# Patient Record
Sex: Male | Born: 1980 | Race: White | Hispanic: No | Marital: Single | Smoking: Current every day smoker
Health system: Southern US, Community
[De-identification: ages and names within clinical notes are randomized; demographics above are authoritative.]

## PROBLEM LIST (undated history)

## (undated) DIAGNOSIS — F4310 Post-traumatic stress disorder, unspecified: Secondary | ICD-10-CM

## (undated) DIAGNOSIS — F329 Major depressive disorder, single episode, unspecified: Secondary | ICD-10-CM

## (undated) DIAGNOSIS — F32A Depression, unspecified: Secondary | ICD-10-CM

## (undated) DIAGNOSIS — F319 Bipolar disorder, unspecified: Secondary | ICD-10-CM

## (undated) DIAGNOSIS — F1910 Other psychoactive substance abuse, uncomplicated: Secondary | ICD-10-CM

## (undated) HISTORY — PX: APPENDECTOMY: SHX54

---
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
## 2003-05-18 ENCOUNTER — Encounter: Payer: Self-pay | Admitting: Emergency Medicine

## 2003-05-19 ENCOUNTER — Encounter (INDEPENDENT_AMBULATORY_CARE_PROVIDER_SITE_OTHER): Payer: Self-pay | Admitting: *Deleted

## 2003-05-19 ENCOUNTER — Observation Stay (HOSPITAL_COMMUNITY): Admission: EM | Admit: 2003-05-19 | Discharge: 2003-05-20 | Payer: Self-pay | Admitting: Emergency Medicine

## 2008-03-10 ENCOUNTER — Emergency Department (HOSPITAL_COMMUNITY): Admission: EM | Admit: 2008-03-10 | Discharge: 2008-03-10 | Payer: Self-pay | Admitting: Emergency Medicine

## 2009-08-16 ENCOUNTER — Emergency Department (HOSPITAL_COMMUNITY): Admission: EM | Admit: 2009-08-16 | Discharge: 2009-08-16 | Payer: Self-pay | Admitting: Emergency Medicine

## 2011-03-16 NOTE — H&P (Signed)
   NAME:  Curtis Blankenship, Curtis Blankenship                          ACCOUNT NO.:  [id number]   MEDICAL RECORD NO.:  [id number]                   PATIENT TYPE:  INP   LOCATION:                                       FACILITY:  [facility]   PHYSICIAN:  Thornton Park. Daphine Deutscher, M.D.             DATE OF BIRTH:  [birth year]   DATE OF ADMISSION:  05/19/2003  DATE OF DISCHARGE:                                HISTORY & PHYSICAL   CHIEF COMPLAINT:  Appendicitis.   HISTORY:  This is a 30 year old white male who awoke on Monday morning, May 17, 2003, with abdominal pain. He thought this was going to go away and kind  of toughed it out for a while. He presented to the ED on July 20 and was  seen in evaluation and included lab showing a hemoglobin of 13.6 and a white  count of 10.9. A CT scan was obtained which showed a large phlegmon around  his appendix and had evidence of acute appendicitis.   PAST MEDICAL HISTORY:  The patient has no known drug allergies. He has had  no prior history.   SOCIAL HISTORY:  The patient is in the [employer], based in [place],  [place].   PHYSICAL EXAMINATION:  GENERAL:  Normally developed white male.  VITAL SIGNS:  Temperature 97.1, pulse 56, respirations 18, blood pressure  140/66.  HEENT:  Head normocephalic. Eyes nonicteric. Mouth with tongue ring.  NECK:  Supple.  CHEST:  Clear.  HEART:  Rhythm; no murmurs or gallops.  ABDOMEN:  Thin, muscular, and tender throughout lower quadrant at McBurney's  point.  EXTREMITIES:  Full range of motion.   IMPRESSION:  Acute appendicitis.   PLAN:  Laparoscopic appendectomy. I have discussed this with him and his  mother and will move forward as soon as possible.                                               Thornton Park Daphine Deutscher, M.D.    MBM/MEDQ  D:  05/19/2003  T:  05/19/2003  Job:  [id number]

## 2011-03-16 NOTE — Op Note (Signed)
NAME:  Curtis Blankenship, Curtis Blankenship                          ACCOUNT NO.:  [id number]   MEDICAL RECORD NO.:  [id number]                   PATIENT TYPE:  INP   LOCATION:  1827                                 FACILITY:  [facility]   PHYSICIAN:  Thornton Park. Daphine Deutscher, M.D.             DATE OF BIRTH:  01/18/1981   DATE OF PROCEDURE:  05/19/2003  DATE OF DISCHARGE:                                 OPERATIVE REPORT   PREOPERATIVE DIAGNOSIS:  Acute appendicitis.   POSTOPERATIVE DIAGNOSIS:  Acute ruptured appendix with abscess.   OPERATION PERFORMED:  Laparoscopic drainage of abscess and appendectomy.   SURGEON:  Thornton Park. Daphine Deutscher, M.D.   ANESTHESIA:  General endotracheal.   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  CT scan suggested appendicitis.   DESCRIPTION OF PROCEDURE:  He was taken to operating room 16 and given  general anesthesia.  Preoperatively, he received 3g of Unasyn.  A  longitudinal incision was made down in his umbilicus first and entered with  a Hasson technique without difficulty.  The abdomen was insufflated.  A 5 mm  placed in the right upper quadrant and a 10-11 in the left lower quadrant.  The cecum was somewhat mobile and the appendix was seen coming off the cecal  tip and heading over and tucked into a ball adjacent to the cecum.  I took  down some of the peritoneum and omentum that was stuck onto this ball and  then dissected beneath the appendix proximally and divided the appendix off  the cecum.  I then began lifting the appendix up and dissecting the  mesentery.  It was here that I found the appendiceal abscess which I entered  and controlled the drainage with a suction catheter.  This had creamy yellow  pus which was evacuated.  I then was able to divide the mesentery using the  Harmonic scalpel and eventually completely extricate  the appendix and place  it in a bag and bring it out.  I went back in and irrigated.  The staple  line of the cecum was hemostatic.  No bleeding was seen.  The appendix had  been placed in the bag and brought out through the umbilicus.  I then  irrigated and observed the area and was satisfied with the evacuation of  fluid and that there was no evidence of bleeding.  The umbilical port was  repaired under direct vision using a 30 degree scope in place and then the  abdomen was deflated and the ports were withdrawn. The wounds were injected  with some Marcaine and were closed with 4-0 Vicryl.  The umbilical port had  previously been placed, closed under direct vision with 2-0 Vicryls in the  umbilicus.  After skin closure, benzoin and Steri-Strips were used  on the  skin.  The patient seemed to tolerate the procedure well and was taken to  the recovery room in satisfactory condition.   FINAL DIAGNOSIS:  Ruptured appendix with abscess, status post takedown,  drainage and appendectomy, laparoscopically.                                               Thornton Park Daphine Deutscher, M.D.   MBM/MEDQ  D:  05/19/2003  T:  05/19/2003  Job:  [id number]

## 2011-03-16 NOTE — Discharge Summary (Signed)
   NAME:  RICHARDSON, DUBREE                          ACCOUNT NO.:  [id number]   MEDICAL RECORD NO.:  [id number]                   PATIENT TYPE:  INP   LOCATION:  5739                                 FACILITY:  [facility]   PHYSICIAN:  Thornton Park. Daphine Deutscher, M.D.             DATE OF BIRTH:  [birth year]   DATE OF ADMISSION:  05/18/2003  DATE OF DISCHARGE:  05/20/2003                                 DISCHARGE SUMMARY   ADMISSION DIAGNOSIS:  Acute appendicitis.   DISCHARGE DIAGNOSIS:  Acute appendicitis with rupture and abscess formation.   PROCEDURE:  Laparoscopic appendectomy with laparoscopic drainage of abscess  and appendectomy.   HOSPITAL COURSE:  Tashan Kreitzer was worked up on May 18, 2003, in the ED  and was subsequently found to have appendicitis around midnight on the  beginning of May 19, 2003, at which time he underwent the above-mentioned  laparoscopic appendectomy.  Postoperatively he did well but was having pain  and some urinary retention, which he overcame.  On the morning of May 20, 2003, his CBC showed a white count of 5000 with a normal hemoglobin.  He was  taking liquids fine.  He was felt to be able to go home, where he is on  leave prior to returning to the [employer] at [place]. [place], [place].  He is  going out on a dosage of Augmentin 875 mg twice a day for seven days and  Tylox for pain.  I have told his mother that I do not want him to go back to  [place]. [place], [place], this coming Monday as I do not think he is ready.  Since he had a ruptured appendix, we need to keep him so that we can follow  up with him in about a week to make sure he is on the way to recovery, at  which time we can negotiate a time for him to return to the [employer].  Incisions are fine, and his condition is improved.   PLAN:  To return to [facility] office in one week.   DISCHARGE MEDICATIONS:  Augmentin and Tylox.   FINAL DIAGNOSIS:  Acute appendicitis with rupture.                         Thornton Park Daphine Deutscher, M.D.    MBM/MEDQ  D:  05/20/2003  T:  05/20/2003  Job:  [id number]

## 2011-04-26 ENCOUNTER — Emergency Department (HOSPITAL_COMMUNITY)
Admission: EM | Admit: 2011-04-26 | Discharge: 2011-04-28 | Disposition: A | Discharge status: Other Acute Inpatient Hospital | Payer: Self-pay | Attending: Emergency Medicine | Admitting: Emergency Medicine

## 2011-04-26 DIAGNOSIS — T50904A Poisoning by unspecified drugs, medicaments and biological substances, undetermined, initial encounter: Secondary | ICD-10-CM | POA: Insufficient documentation

## 2011-04-26 DIAGNOSIS — F3289 Other specified depressive episodes: Secondary | ICD-10-CM | POA: Insufficient documentation

## 2011-04-26 DIAGNOSIS — F329 Major depressive disorder, single episode, unspecified: Secondary | ICD-10-CM | POA: Insufficient documentation

## 2011-04-26 DIAGNOSIS — T50901A Poisoning by unspecified drugs, medicaments and biological substances, accidental (unintentional), initial encounter: Secondary | ICD-10-CM | POA: Insufficient documentation

## 2011-04-27 LAB — CBC
HCT: 37 % — ABNORMAL LOW (ref 39.0–52.0)
Hemoglobin: 12.8 g/dL — ABNORMAL LOW (ref 13.0–17.0)
MCH: 30.9 pg (ref 26.0–34.0)
MCHC: 34.6 g/dL (ref 30.0–36.0)
MCV: 89.4 fL (ref 78.0–100.0)
Platelets: 370 10*3/uL (ref 150–400)
RBC: 4.14 MIL/uL — ABNORMAL LOW (ref 4.22–5.81)
RDW: 12.8 % (ref 11.5–15.5)
WBC: 9.3 10*3/uL (ref 4.0–10.5)

## 2011-04-27 LAB — DIFFERENTIAL
Basophils Absolute: 0.1 10*3/uL (ref 0.0–0.1)
Basophils Relative: 1 % (ref 0–1)
Eosinophils Absolute: 0.1 10*3/uL (ref 0.0–0.7)
Eosinophils Relative: 1 % (ref 0–5)
Lymphocytes Relative: 38 % (ref 12–46)
Lymphs Abs: 3.6 10*3/uL (ref 0.7–4.0)
Monocytes Absolute: 0.4 10*3/uL (ref 0.1–1.0)
Monocytes Relative: 4 % (ref 3–12)
Neutro Abs: 5.2 10*3/uL (ref 1.7–7.7)
Neutrophils Relative %: 56 % (ref 43–77)

## 2011-04-27 LAB — URINALYSIS, ROUTINE W REFLEX MICROSCOPIC
Bilirubin Urine: NEGATIVE
Glucose, UA: NEGATIVE mg/dL
Hgb urine dipstick: NEGATIVE
Protein, ur: NEGATIVE mg/dL

## 2011-04-27 LAB — COMPREHENSIVE METABOLIC PANEL
ALT: 19 U/L (ref 0–53)
AST: 17 U/L (ref 0–37)
Alkaline Phosphatase: 75 U/L (ref 39–117)
CO2: 25 mEq/L (ref 19–32)
Calcium: 9.1 mg/dL (ref 8.4–10.5)
Chloride: 101 mEq/L (ref 96–112)
GFR calc non Af Amer: >60 mL/min (ref >60)
Potassium: 3.7 mEq/L (ref 3.5–5.1)
Sodium: 138 mEq/L (ref 135–145)

## 2011-04-27 LAB — RAPID URINE DRUG SCREEN, HOSP PERFORMED
Amphetamines: NOT DETECTED
Cocaine: NOT DETECTED
Opiates: NOT DETECTED
Tetrahydrocannabinol: NOT DETECTED

## 2011-04-27 LAB — GLUCOSE, CAPILLARY: Glucose-Capillary: 123 mg/dL — ABNORMAL HIGH (ref 70–99)

## 2011-04-27 LAB — SALICYLATE LEVEL: Salicylate Lvl: <2 mg/dL — ABNORMAL LOW (ref 2.8–20.0)

## 2011-04-28 DIAGNOSIS — F322 Major depressive disorder, single episode, severe without psychotic features: Secondary | ICD-10-CM

## 2011-04-28 DIAGNOSIS — F4310 Post-traumatic stress disorder, unspecified: Secondary | ICD-10-CM

## 2011-04-28 NOTE — Consult Note (Signed)
Curtis Blankenship, Curtis Blankenship             ACCOUNT NO.:  [id number]  MEDICAL RECORD NO.:  [id number]  LOCATION:  [facility]                         FACILITY:  [facility]  PHYSICIAN:  Franchot Gallo, MD     DATE OF BIRTH:  September 27, 1981  DATE OF CONSULTATION:  04/28/2011 DATE OF DISCHARGE:  04/26/2011                                CONSULTATION   CHIEF COMPLAINT:  "I tried to kill myself with propranolol."  HISTORY OF PRESENT ILLNESS:  Mr. Curtis Blankenship is a 30 year old single white male who presented to [facility] after overdosing on 20 propranolol tablets in an apparent suicide attempt.  The patient stated on interview that he "just wanted everything to go way."  When asked in more detail, the patient states that he has been depressed for some time, rating his depression today on a scale of 1 to 10 as an 8.  He reports trouble initiating and maintaining sleep as well as decreased appetite and reports severe feelings of sadness, anhedonia and depressed mood.  He denies any current suicidal ideations as well as any homicidal ideations.  The patient also states he has significant anxiety throughout the day and currently rates his anxiety on a scale of 1 to 10 as a 9.  He denies any past or current auditory or visual hallucinations or delusional thinking and also denies any past or current manic or hypomanic symptoms.  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  He states that he has frequent flashbacks at night related to traumatic events.  He has been followed at the [facility] for some time for his symptoms.  The patient also reports a past history of alcohol abuse but states that he has been sober for approximately 5 months.  The patient presents for evaluation and treatment of the above symptoms.  PAST PSYCHIATRIC HISTORY:  The patient reports 2 past psychiatric hospitalizations both to [facility] hospitals.  He states that he  was hospitalized at the [facility] in [place] on their subspecialty unit in January 2012 and was secondarily hospitalized at a [facility] in [place] for PTSD between April and June 2012.  CURRENT MEDICATIONS: 1. Prazosin 4 mg p.o. q.h.s. 2. Risperdal 3 mg b.i.d. 3. Effexor XR 225 mg p.o. q.a.m. 4. Ambien 10 mg p.o. q.h.s./p.r.n. for sleep.  ALLERGIES:  NKDA.  MEDICAL ILLNESSES:  None reported.  PAST OPERATIONS:  Appendectomy approximately 8 years ago.  FAMILY HISTORY:  The patient states that his mother is depressed and his maternal grandparents have a history of alcohol abuse.  He denies any other family history of psychiatric or substance related illnesses.  SOCIAL HISTORY:  The patient states that he was born and raised in [place], [place] area and currently lives in [place] with his parents.  He is single and has never had children.  He states that he completed high school and some college and is currently unemployed. He reports smoking 1 pack of cigarettes per day and as stated above has a past abuse of alcohol but states that he has been sober for the past 5 months.  He denies any use of illicit drugs.  MENTAL STATUS EXAMINATION:  General - the patient was alert and oriented x3 and was friendly and cooperative with this Environmental consultant.  Speech was appropriate in terms of rate and volume.  Mood appeared severely depressed.  Affect was essentially flat.  Thoughts - the patient denied any obvious delusions or hallucinations, not reporting any suicidal or homicidal ideations.  Judgment and insight today both appeared fair to good.  IMPRESSION:   Axis I: 1. Major depressive disorder - recurrent - severe. 2. Generalized anxiety disorder - currently under poor control. 3. Posttraumatic stress disorder - currently poor control. 4. Alcohol dependence - in remission x5 months. 5. Nicotine dependence. Axis II:  Deferred. Axis III:  None reported. Axis IV:   Unemployment, financial constraints, serious chronic mental illness. Axis V:  Global Assessment of Functioning at time of admission approximately 40.  Highest Global Assessment of Functioning past year approximately 60.  PLAN: 1. The patient will continue on his current medications as prescribed. 2. The patient will be continued to be monitored for dangers to self     and/or others. 3. The patient has been accepted for treatment at the [facility] in     [place], [place], and is awaiting transport.    _________________________________ Franchot Gallo, MD     RR/MEDQ  D:  04/28/2011  T:  04/28/2011  Job:  [id number]  Electronically Signed by Franchot Gallo MD on 04/28/2011 04:28:03 PM

## 2012-09-04 ENCOUNTER — Emergency Department (HOSPITAL_COMMUNITY): Payer: Non-veteran care

## 2012-09-04 ENCOUNTER — Emergency Department (HOSPITAL_COMMUNITY)
Admission: EM | Admit: 2012-09-04 | Discharge: 2012-09-05 | Disposition: A | Discharge status: Home / Self Care | Payer: Non-veteran care | Attending: Emergency Medicine | Admitting: Emergency Medicine

## 2012-09-04 ENCOUNTER — Encounter (HOSPITAL_COMMUNITY): Payer: Self-pay | Admitting: Emergency Medicine

## 2012-09-04 DIAGNOSIS — F4310 Post-traumatic stress disorder, unspecified: Secondary | ICD-10-CM | POA: Insufficient documentation

## 2012-09-04 DIAGNOSIS — F17200 Nicotine dependence, unspecified, uncomplicated: Secondary | ICD-10-CM | POA: Insufficient documentation

## 2012-09-04 DIAGNOSIS — L539 Erythematous condition, unspecified: Secondary | ICD-10-CM | POA: Insufficient documentation

## 2012-09-04 DIAGNOSIS — L02414 Cutaneous abscess of left upper limb: Secondary | ICD-10-CM

## 2012-09-04 DIAGNOSIS — F1410 Cocaine abuse, uncomplicated: Secondary | ICD-10-CM | POA: Insufficient documentation

## 2012-09-04 DIAGNOSIS — R0602 Shortness of breath: Secondary | ICD-10-CM | POA: Insufficient documentation

## 2012-09-04 DIAGNOSIS — Z79899 Other long term (current) drug therapy: Secondary | ICD-10-CM | POA: Insufficient documentation

## 2012-09-04 DIAGNOSIS — F319 Bipolar disorder, unspecified: Secondary | ICD-10-CM | POA: Insufficient documentation

## 2012-09-04 DIAGNOSIS — R079 Chest pain, unspecified: Secondary | ICD-10-CM | POA: Insufficient documentation

## 2012-09-04 DIAGNOSIS — IMO0002 Reserved for concepts with insufficient information to code with codable children: Secondary | ICD-10-CM | POA: Insufficient documentation

## 2012-09-04 HISTORY — DX: Bipolar disorder, unspecified: F31.9

## 2012-09-04 HISTORY — DX: Post-traumatic stress disorder, unspecified: F43.10

## 2012-09-04 LAB — COMPREHENSIVE METABOLIC PANEL
ALT: 22 U/L (ref 0–53)
Albumin: 3.9 g/dL (ref 3.5–5.2)
Alkaline Phosphatase: 112 U/L (ref 39–117)
Potassium: 3.5 mEq/L (ref 3.5–5.1)
Sodium: 137 mEq/L (ref 135–145)
Total Protein: 7.3 g/dL (ref 6.0–8.3)

## 2012-09-04 LAB — CBC WITH DIFFERENTIAL/PLATELET
Basophils Relative: 1 % (ref 0–1)
Eosinophils Absolute: 0.1 10*3/uL (ref 0.0–0.7)
MCH: 31.1 pg (ref 26.0–34.0)
MCHC: 34.3 g/dL (ref 30.0–36.0)
Neutrophils Relative %: 61 % (ref 43–77)
Platelets: 344 10*3/uL (ref 150–400)
RBC: 4.21 MIL/uL — ABNORMAL LOW (ref 4.22–5.81)

## 2012-09-04 LAB — TROPONIN I: Troponin I: <0.3 ng/mL (ref <0.30)

## 2012-09-04 LAB — RAPID URINE DRUG SCREEN, HOSP PERFORMED
Amphetamines: NOT DETECTED
Barbiturates: NOT DETECTED
Benzodiazepines: NOT DETECTED

## 2012-09-04 MED ORDER — VENLAFAXINE HCL ER 150 MG PO CP24
225.0000 mg | ORAL_CAPSULE | Freq: Every day | ORAL | Status: DC
  Administered 2012-09-04: 225 mg via ORAL
  Filled 2012-09-04 (×2): qty 1

## 2012-09-04 MED ORDER — ZOLPIDEM TARTRATE 5 MG PO TABS: 10.0000 mg | ORAL_TABLET | Freq: Every evening | ORAL | Status: DC | PRN

## 2012-09-04 MED ORDER — LORAZEPAM 1 MG PO TABS
1.0000 mg | ORAL_TABLET | Freq: Three times a day (TID) | ORAL | Status: DC | PRN
  Administered 2012-09-04: 1 mg via ORAL
  Filled 2012-09-04: qty 1

## 2012-09-04 MED ORDER — LIDOCAINE HCL 1 % IJ SOLN
INTRAMUSCULAR | Status: AC
  Administered 2012-09-04: 3 mL
  Filled 2012-09-04: qty 20

## 2012-09-04 MED ORDER — PRAZOSIN HCL 2 MG PO CAPS
2.0000 mg | ORAL_CAPSULE | Freq: Two times a day (BID) | ORAL | Status: DC
  Administered 2012-09-04: 2 mg via ORAL
  Filled 2012-09-04 (×3): qty 1

## 2012-09-04 MED ORDER — NICOTINE 21 MG/24HR TD PT24
21.0000 mg | MEDICATED_PATCH | Freq: Every day | TRANSDERMAL | Status: DC
  Administered 2012-09-04: 21 mg via TRANSDERMAL
  Filled 2012-09-04: qty 1

## 2012-09-04 MED ORDER — ONDANSETRON HCL 4 MG PO TABS: 4.0000 mg | ORAL_TABLET | Freq: Three times a day (TID) | ORAL | Status: DC | PRN

## 2012-09-04 MED ORDER — IBUPROFEN 200 MG PO TABS: 600.0000 mg | ORAL_TABLET | Freq: Three times a day (TID) | ORAL | Status: DC | PRN

## 2012-09-04 MED ORDER — CLINDAMYCIN PHOSPHATE 600 MG/50ML IV SOLN
600.0000 mg | Freq: Once | INTRAVENOUS | Status: AC
  Administered 2012-09-04: 600 mg via INTRAVENOUS
  Filled 2012-09-04: qty 50

## 2012-09-04 MED ORDER — CLINDAMYCIN HCL 300 MG PO CAPS
300.0000 mg | ORAL_CAPSULE | Freq: Three times a day (TID) | ORAL | Status: DC
  Administered 2012-09-04 (×2): 300 mg via ORAL
  Filled 2012-09-04 (×2): qty 1

## 2012-09-04 MED ORDER — ACETAMINOPHEN 325 MG PO TABS: 650.0000 mg | ORAL_TABLET | ORAL | Status: DC | PRN

## 2012-09-04 MED ORDER — SODIUM CHLORIDE 0.9 % IV BOLUS (SEPSIS)
500.0000 mL | Freq: Once | INTRAVENOUS | Status: AC
  Administered 2012-09-04: 500 mL via INTRAVENOUS

## 2012-09-04 NOTE — ED Notes (Signed)
Pt belongings in locker #[id number] 

## 2012-09-04 NOTE — ED Provider Notes (Addendum)
INCISION AND DRAINAGE Performed by: Jaci Carrel Consent: Verbal consent obtained. Risks and benefits: risks, benefits and alternatives were discussed Type: abscess  Body area: left anterior forearm, just inferior to [facility] area  Anesthesia: local infiltration  Local anesthetic: lidocaine 2% no epinephrine  Anesthetic total: 1 ml  Complexity: complex Blunt dissection to break up loculations  Drainage: purulent  Drainage amount: moderate  Packing material: none  Patient tolerance: Patient tolerated the procedure well with no immediate complications.  scalpel used to perform incision  Jaci Carrel, PA-C 09/04/12 [address] PA-C 09/23/12 1533

## 2012-09-04 NOTE — ED Notes (Signed)
Per EMS: Pt used 8-ball injection of cocaine this morning, experienced chest pain, which has resolved and requesting detox. Left arm appears to have infection at injection sites.

## 2012-09-04 NOTE — ED Notes (Signed)
Pt present with need to detox with cocaine.  Pt aaox3.  Pt has no complaints at this time.

## 2012-09-04 NOTE — ED Notes (Signed)
[id number]<AV> Expected date:<BR> Expected time:<BR> Means of arrival:<BR> Comments:<BR> Numbness after shooting up cocaine

## 2012-09-04 NOTE — ED Notes (Signed)
Pt unable to urinate. Dr. [name] made aware.

## 2012-09-04 NOTE — ED Provider Notes (Signed)
History     CSN: [id number]  Arrival date & time 09/04/12  1214   First MD Initiated Contact with Patient 09/04/12 1323      Chief Complaint  Patient presents with  . Chest Pain    After using cocaine and requesting detox  . Skin Problem    Skin infection, abcess noted to left [facility]    (Consider location/radiation/quality/duration/timing/severity/associated sxs/prior treatment) Patient is a 31 y.o. male presenting with chest pain. The history is provided by the patient.  Chest Pain Primary symptoms include shortness of breath. Pertinent negatives for primary symptoms include no abdominal pain, no nausea and no vomiting.  Pertinent negatives for associated symptoms include no numbness and no weakness.    patient presents with sharp chest pain associated with dyspnea after injecting cocaine. He feels much better now. He states he does not usually have chest pain with his cocaine use. He states he's been using heavily recently. He states that he used an 8 ball this morning. He denies other drug use. He states that he has an abscess on his left arm. He states that he missed the vein a couple days ago and has gotten bigger and more painful. No fevers. No previous heart history.  Past Medical History  Diagnosis Date  . Bipolar disorder   . PTSD (post-traumatic stress disorder)     Past Surgical History  Procedure Date  . Appendectomy     No family history on file.  History  Substance Use Topics  . Smoking status: Current Every Day Smoker -- 0.2 packs/day for 10 years    Types: Cigarettes  . Smokeless tobacco: Former Neurosurgeon    Types: Snuff  . Alcohol Use: 24.0 oz/week    40 Cans of beer per week      Review of Systems  Constitutional: Negative for activity change and appetite change.  HENT: Negative for neck stiffness.   Eyes: Negative for pain.  Respiratory: Positive for shortness of breath. Negative for chest tightness.   Cardiovascular: Positive for chest pain. Negative  for leg swelling.  Gastrointestinal: Negative for nausea, vomiting, abdominal pain and diarrhea.  Genitourinary: Negative for flank pain.  Musculoskeletal: Negative for back pain.  Skin: Positive for wound. Negative for rash.  Neurological: Negative for weakness, numbness and headaches.  Psychiatric/Behavioral: Positive for dysphoric mood. Negative for suicidal ideas and behavioral problems.    Allergies  Review of patient's allergies indicates no known allergies.  Home Medications   Current Outpatient Rx  Name  Route  Sig  Dispense  Refill  . PRAZOSIN HCL 2 MG PO CAPS   Oral   Take 2 mg by mouth 2 (two) times daily.         . VENLAFAXINE HCL ER 75 MG PO CP24   Oral   Take 225 mg by mouth daily.         Marland Kitchen ZOLPIDEM TARTRATE 10 MG PO TABS   Oral   Take 10 mg by mouth at bedtime as needed. For sleep.           BP 155/81  Pulse 79  Temp 98.6 F (37 C) (Oral)  Resp 18  SpO2 97%  Physical Exam  Nursing note and vitals reviewed. Constitutional: He is oriented to person, place, and time. He appears well-developed and well-nourished.  HENT:  Head: Normocephalic and atraumatic.  Eyes: EOM are normal. Pupils are equal, round, and reactive to light.  Neck: Normal range of motion. Neck supple.  Cardiovascular: Normal rate,  regular rhythm and normal heart sounds.   No murmur heard. Pulmonary/Chest: Effort normal and breath sounds normal.  Abdominal: Soft. Bowel sounds are normal. He exhibits no distension and no mass. There is no tenderness. There is no rebound and no guarding.  Musculoskeletal: Normal range of motion. He exhibits no edema.  Neurological: He is alert and oriented to person, place, and time. No cranial nerve deficit.  Skin: Skin is warm and dry.       Patient has multiple injection sites on bilateral forearms. He has an approximately 3 cm firm erythematous mass on his left lateral antecubital area.  Psychiatric: He has a normal mood and affect.    ED  Course  Procedures (including critical care time)  Labs Reviewed  CBC WITH DIFFERENTIAL - Abnormal; Notable for the following:    RBC 4.21 (*)     HCT 38.2 (*)     All other components within normal limits  COMPREHENSIVE METABOLIC PANEL  ETHANOL  TROPONIN I  URINE RAPID DRUG SCREEN (HOSP PERFORMED)   Dg Chest 2 View  09/04/2012  *RADIOLOGY REPORT*  Clinical Data: Chest pain.  Smoker.  CHEST - 2 VIEW  Comparison: None.  Findings: Normal sized heart.  Clear lungs.  Mild central peribronchial thickening.  Normal appearing bones.  IMPRESSION: Mild bronchitic changes.   Original Report Authenticated By: Beckie Salts, M.D.      1. Cocaine abuse   2. Chest pain   3. Abscess of arm, left      Date: 09/04/2012  Rate: 97  Rhythm: normal sinus rhythm  QRS Axis: normal  Intervals: normal  ST/T Wave abnormalities: normal  Conduction Disutrbances:none  Narrative Interpretation:   Old EKG Reviewed: none available    MDM  Patient with left forearm abscess. It will be drained and antibiotics will be started. Is also having chest pain after his cocaine use. He appears to medically cleared at this time with negative EKG negative enzymes. He is requesting detox off of cocaine. Some lab work is pending but I suspect he'll be medically cleared. He'll be seen by the ACT team.        Juliet Rude. [name], MD 09/04/12 [id number]

## 2012-09-04 NOTE — Progress Notes (Signed)
pt states [facility] & he also goes to [facility] for services

## 2012-09-05 MED ORDER — ASPIRIN 81 MG PO CHEW: 81.0000 mg | CHEWABLE_TABLET | Freq: Every day | ORAL | Status: DC

## 2012-09-05 NOTE — ED Provider Notes (Signed)
Medical screening examination/treatment/procedure(s) were conducted as a shared visit with non-physician practitioner(s) and myself.  I personally evaluated the patient during the encounter  Curtis Blankenship. [name], MD 09/05/12 [phone]

## 2012-09-05 NOTE — BH Assessment (Signed)
Assessment Note   Curtis Blankenship is a 31 y.o. male who presents to [facility] for detox.  Pt reports drinking 1 case of beer daily, last drink was 09/04/12, pt says had 4-40's in the morning.  Pt has no alcohol present in his BAL.  Pt says he binges on cocaine, last use was 09/04/12, pt says used $500 for 4 days.  This Clinical research associate told pt he could benefit from rehab.  Pt was agreeable and given referrals.  Dr. Dierdre Highman agreed with disposition and pt d/c home.   Axis I: Substance Abuse Axis II: Deferred Axis III:  Past Medical History  Diagnosis Date  . Bipolar disorder   . PTSD (post-traumatic stress disorder)    Axis IV: other psychosocial or environmental problems, problems related to social environment and problems with primary support group Axis V: 51-60 moderate symptoms  Past Medical History:  Past Medical History  Diagnosis Date  . Bipolar disorder   . PTSD (post-traumatic stress disorder)     Past Surgical History  Procedure Date  . Appendectomy     Family History: No family history on file.  Social History:  reports that he has been smoking Cigarettes.  He has a 2.5 pack-year smoking history. He has quit using smokeless tobacco. His smokeless tobacco use included Snuff. He reports that he drinks about 24 ounces of alcohol per week. He reports that he uses illicit drugs (Cocaine) about 3 times per week.  Additional Social History:  Alcohol / Drug Use Pain Medications: None  Prescriptions: None  Over the Counter: Noe  History of alcohol / drug use?: Yes Longest period of sobriety (when/how long): Unk  Negative Consequences of Use: Personal relationships Withdrawal Symptoms: Other (Comment) (No w/d sxs present) Substance #1 Name of Substance 1: Cocaine  1 - Age of First Use: 20 YOM  1 - Amount (size/oz): $500 1 - Frequency: "Binge"  1 - Duration: 4 days  1 - Last Use / Amount: 09/04/12 Substance #2 Name of Substance 2: Alcohol  2 - Age of First Use: 15 YOM  2 - Amount  (size/oz): 1 Case  2 - Frequency: Daily  2 - Duration: On-going  2 - Last Use / Amount: 09/04/12  CIWA: CIWA-Ar BP: 118/76 mmHg Pulse Rate: 109  Nausea and Vomiting: no nausea and no vomiting Tactile Disturbances: none Tremor: no tremor Auditory Disturbances: not present Paroxysmal Sweats: no sweat visible Visual Disturbances: not present Anxiety: no anxiety, at ease Headache, Fullness in Head: none present Agitation: normal activity Orientation and Clouding of Sensorium: oriented and can do serial additions CIWA-Ar Total: 0  COWS:    Allergies: No Known Allergies  Home Medications:  (Not in a hospital admission)  OB/GYN Status:  No LMP for male patient.  General Assessment Data [facility] of [facility]: [facility] Living Arrangements: Alone Can pt return to current living arrangement?: Yes Admission Status: Voluntary Is patient capable of signing voluntary admission?: Yes Transfer from: Acute Hospital Referral Source: MD  Education Status Is patient currently in school?: No Current Grade: None  Highest grade of school patient has completed: None  Name of school: None  Contact person: None   Risk to self Suicidal Ideation: No Suicidal Intent: No Is patient at risk for suicide?: No Suicidal Plan?: No Access to Means: No What has been your use of drugs/alcohol within the last 12 months?: Abusing; alcohol and cocaine  Previous Attempts/Gestures: No How many times?: 0  Other Self Harm Risks: None  Triggers for Past Attempts:  None known Intentional Self Injurious Behavior: None Family Suicide History: No Recent stressful life event(s): Other (Comment) (SA) Persecutory voices/beliefs?: No Depression: Yes Depression Symptoms: Loss of interest in usual pleasures Substance abuse history and/or treatment for substance abuse?: Yes Suicide prevention information given to non-admitted patients: Not applicable  Risk to Others Homicidal Ideation: No Thoughts of Harm to  Others: No Current Homicidal Intent: No Current Homicidal Plan: No Access to Homicidal Means: No Identified Victim: None  History of harm to others?: No Assessment of Violence: None Noted Violent Behavior Description: None  Does patient have access to weapons?: No Criminal Charges Pending?: No Does patient have a court date: No  Psychosis Hallucinations: None noted Delusions: None noted  Mental Status Report Appear/Hygiene: Disheveled Eye Contact: Good Motor Activity: Unremarkable Speech: Logical/coherent Level of Consciousness: Alert Mood: Sad Affect: Sad Anxiety Level: None Thought Processes: Coherent;Relevant Judgement: Unimpaired Orientation: Person;Place;Time;Situation Obsessive Compulsive Thoughts/Behaviors: None  Cognitive Functioning Concentration: Normal Memory: Recent Intact;Remote Intact IQ: Average Insight: Fair Impulse Control: Fair Appetite: Fair Weight Loss: 0  Weight Gain: 0  Sleep: No Change Total Hours of Sleep: 8  Vegetative Symptoms: None  ADLScreening [facility] Assessment [facility]) Patient's cognitive ability adequate to safely complete daily activities?: Yes Patient able to express need for assistance with ADLs?: Yes Independently performs ADLs?: Yes (appropriate for developmental age)  Abuse/Neglect [facility]) Physical Abuse: Denies Verbal Abuse: Denies Sexual Abuse: Denies  Prior Inpatient Therapy Prior Inpatient Therapy: Yes Prior Therapy Dates: Unk  Prior Therapy Facilty/Provider(s): Unk  Reason for Treatment: Detox/Rehab   Prior Outpatient Therapy Prior Outpatient Therapy: No Prior Therapy Dates: None  Prior Therapy Facilty/Provider(s): None  Reason for Treatment: None   ADL Screening (condition at time of admission) Patient's cognitive ability adequate to safely complete daily activities?: Yes Patient able to express need for assistance with ADLs?: Yes Independently performs ADLs?: Yes (appropriate for developmental age) Weakness  of Legs: None Weakness of Arms/Hands: None  Home Assistive Devices/Equipment Home Assistive Devices/Equipment: None  Therapy Consults (therapy consults require a physician order) PT Evaluation Needed: No OT Evalulation Needed: No SLP Evaluation Needed: No Abuse/Neglect Assessment (Assessment to be complete while patient is alone) Physical Abuse: Denies Verbal Abuse: Denies Sexual Abuse: Denies Exploitation of patient/patient's resources: Denies Self-Neglect: Denies Values / Beliefs Cultural Requests During Hospitalization: None Spiritual Requests During Hospitalization: None Consults Spiritual Care Consult Needed: No Social Work Consult Needed: No Merchant navy officer (For Healthcare) Advance Directive: Patient does not have advance directive;Patient would not like information Pre-existing out of facility DNR order (yellow form or pink MOST form): No Nutrition Screen- [facility] Adult/[facility] Patient's home diet: Regular Have you recently lost weight without trying?: No Have you been eating poorly because of a decreased appetite?: No Malnutrition Screening Tool Score: 0   Additional Information 1:1 In Past 12 Months?: No CIRT Risk: No Elopement Risk: No Does patient have medical clearance?: Yes     Disposition:  Disposition Disposition of Patient: Referred to (Outpt resources for rehab ) Patient referred to: Outpatient clinic referral  On Site Evaluation by:   Reviewed with Physician:     Murrell Redden 09/05/2012 5:11 AM

## 2012-09-24 NOTE — ED Provider Notes (Signed)
Medical screening examination/treatment/procedure(s) were performed by non-physician practitioner and as supervising physician I was immediately available for consultation/collaboration.  Ahsan Esterline R. Aerik Polan, MD 09/24/12 0358 

## 2012-11-30 ENCOUNTER — Emergency Department (HOSPITAL_COMMUNITY)
Admission: EM | Admit: 2012-11-30 | Discharge: 2012-12-01 | Disposition: A | Discharge status: Other Acute Inpatient Hospital | Payer: Medicare Other | Source: Home / Self Care | Attending: Emergency Medicine | Admitting: Emergency Medicine

## 2012-11-30 ENCOUNTER — Encounter (HOSPITAL_COMMUNITY): Payer: Self-pay | Admitting: Emergency Medicine

## 2012-11-30 DIAGNOSIS — R45851 Suicidal ideations: Secondary | ICD-10-CM | POA: Insufficient documentation

## 2012-11-30 DIAGNOSIS — F1910 Other psychoactive substance abuse, uncomplicated: Secondary | ICD-10-CM | POA: Insufficient documentation

## 2012-11-30 DIAGNOSIS — F17200 Nicotine dependence, unspecified, uncomplicated: Secondary | ICD-10-CM | POA: Insufficient documentation

## 2012-11-30 DIAGNOSIS — R4589 Other symptoms and signs involving emotional state: Secondary | ICD-10-CM

## 2012-11-30 DIAGNOSIS — F1010 Alcohol abuse, uncomplicated: Secondary | ICD-10-CM | POA: Insufficient documentation

## 2012-11-30 DIAGNOSIS — F4310 Post-traumatic stress disorder, unspecified: Secondary | ICD-10-CM | POA: Insufficient documentation

## 2012-11-30 DIAGNOSIS — F319 Bipolar disorder, unspecified: Secondary | ICD-10-CM | POA: Insufficient documentation

## 2012-11-30 LAB — CBC
MCV: 90.8 fL (ref 78.0–100.0)
Platelets: 384 10*3/uL (ref 150–400)
RDW: 13.6 % (ref 11.5–15.5)
WBC: 12 10*3/uL — ABNORMAL HIGH (ref 4.0–10.5)

## 2012-11-30 LAB — COMPREHENSIVE METABOLIC PANEL
AST: 24 U/L (ref 0–37)
Albumin: 4.5 g/dL (ref 3.5–5.2)
Chloride: 96 mEq/L (ref 96–112)
Creatinine, Ser: 0.84 mg/dL (ref 0.50–1.35)
Potassium: 3.9 mEq/L (ref 3.5–5.1)
Total Bilirubin: 0.8 mg/dL (ref 0.3–1.2)

## 2012-11-30 LAB — RAPID URINE DRUG SCREEN, HOSP PERFORMED
Amphetamines: NOT DETECTED
Benzodiazepines: NOT DETECTED
Cocaine: POSITIVE — AB
Opiates: NOT DETECTED
Tetrahydrocannabinol: NOT DETECTED

## 2012-11-30 MED ORDER — CLONIDINE HCL 0.1 MG PO TABS
0.1000 mg | ORAL_TABLET | Freq: Three times a day (TID) | ORAL | Status: DC | PRN
  Administered 2012-11-30: 0.1 mg via ORAL
  Filled 2012-11-30: qty 1

## 2012-11-30 MED ORDER — SULFAMETHOXAZOLE-TMP DS 800-160 MG PO TABS
1.0000 | ORAL_TABLET | Freq: Two times a day (BID) | ORAL | Status: DC
  Administered 2012-11-30 – 2012-12-01 (×3): 1 via ORAL
  Filled 2012-11-30 (×3): qty 1

## 2012-11-30 NOTE — ED Notes (Signed)
Resting quietly

## 2012-11-30 NOTE — BHH Counselor (Signed)
Per Shalita Forrest, AC no beds currently available at [facility]. Shuvon Rankin, FNP reviewed clinical information and accepted Pt pending bed availability.  Sivan Cuello Ellis Malik Paar Jr, LPC, [facility] Assessment Counselor 

## 2012-11-30 NOTE — ED Notes (Signed)
telepsych in progress 

## 2012-11-30 NOTE — ED Notes (Signed)
telepsych info faxed and called 

## 2012-11-30 NOTE — ED Provider Notes (Signed)
telepsych noted. Pt states he feels very depressed, needs help w etoh abuse as well.  Discussed w act team - Reita Cliche indicates pt is currently being reviewed at [facility] for possible admission there.    Suzi Roots, MD 11/30/12 1352

## 2012-11-30 NOTE — ED Notes (Signed)
Dr steinl into see 

## 2012-11-30 NOTE — BH Assessment (Signed)
Assessment Note   Curtis Blankenship is an 32 y.o. male.   Pt reports he wants alcohol detox.  Has been consuming alcohol for over a year.  Using alcohol daily and currently consuming approximately a case of beer per day.  Pt reports sporadic use of cocaine and THC.  Pt reports suicidal thoughts but no current plan.  Pt reports prior gestures/attempts and hospitalizations.  Pt reports possible means if he wanted to end life.    Pt made good eye contact, appeared anxious, CIWA approximately a 4, Ox3, judgement appeared unimpaired at this time, however SI comments made on admission.    Recommend Inptx for detox and help with depression.  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  I want help elsewhere, not at the [place]."      Axis I: Alcohol Abuse and Major Depression, Recurrent severe Axis II: Deferred Axis III:  Past Medical History  Diagnosis Date  . Bipolar disorder   . PTSD (post-traumatic stress disorder)    Axis IV: other psychosocial or environmental problems, problems related to social environment and problems with primary support group Axis V: 41-50 serious symptoms  Past Medical History:  Past Medical History  Diagnosis Date  . Bipolar disorder   . PTSD (post-traumatic stress disorder)     Past Surgical History  Procedure Date  . Appendectomy     Family History: No family history on file.  Social History:  reports that he has been smoking Cigarettes.  He has a 2.5 pack-year smoking history. He has quit using smokeless tobacco. His smokeless tobacco use included Snuff. He reports that he drinks about 24 ounces of alcohol per week. He reports that he uses illicit drugs (Cocaine) about 3 times per week.  Additional Social History:  Alcohol / Drug Use Pain Medications: na Prescriptions: na Over the Counter: na History of alcohol / drug use?: Yes Substance #1 Name of Substance 1: alcohol 1 - Age of First Use: 18 1  - Amount (size/oz): 24 beers 1 - Frequency: daily 1 - Duration: 1 yrs 1 - Last Use / Amount: 11-29-12 Substance #2 Name of Substance 2: Cocaine 2 - Age of First Use: 19 2 - Amount (size/oz): varies 2 - Frequency: 5 day binges every other month or so 2 - Duration: periodic 2 - Last Use / Amount: 11-29-12 Substance #3 Name of Substance 3: THC 3 - Age of First Use: 18 3 - Amount (size/oz): varies 3 - Frequency: varies 3 - Duration: varies -"Not something I do much" 3 - Last Use / Amount: 11-18-12  CIWA: CIWA-Ar BP: 128/80 mmHg Pulse Rate: 105  Nausea and Vomiting: no nausea and no vomiting Tactile Disturbances: none Tremor: no tremor Auditory Disturbances: not present Paroxysmal Sweats: no sweat visible Visual Disturbances: not present Anxiety: two Headache, Fullness in Head: very mild Agitation: somewhat more than normal activity Orientation and Clouding of Sensorium: oriented and can do serial additions CIWA-Ar Total: 4  COWS: Clinical Opiate Withdrawal Scale (COWS) Resting Pulse Rate: Pulse Rate 101-120 Sweating: No report of chills or flushing Restlessness: Reports difficulty sitting still, but is able to do so Pupil Size: Pupils pinned or normal size for room light Bone or Joint Aches: Not present Runny Nose or Tearing: Not present GI Upset: No GI symptoms Tremor: Tremor can be felt, but not observed Yawning: No yawning Anxiety or Irritability: Patient obviously irritable/anxious Gooseflesh Skin: Skin is smooth COWS  Total Score: 6   Allergies: No Known Allergies  Home Medications:  (Not in a hospital admission)  OB/GYN Status:  No LMP for male patient.  General Assessment Data [facility]: [facility] Living Arrangements: Alone Can pt return to current living arrangement?: Yes Admission Status: Voluntary Is patient capable of signing voluntary admission?: Yes Transfer from: Acute Hospital Referral Source: MD  Education Status Is patient currently in  school?: No  Risk to self Suicidal Ideation: Yes-Currently Present Suicidal Intent: No Is patient at risk for suicide?: No Suicidal Plan?: No Access to Means: Yes Specify Access to Suicidal Means: has access to pills What has been your use of drugs/alcohol within the last 12 months?: alcohol Previous Attempts/Gestures: Yes How many times?: 2  Other Self Harm Risks: na Triggers for Past Attempts: Unpredictable Intentional Self Injurious Behavior: None Family Suicide History: No Recent stressful life event(s):  (when using) Persecutory voices/beliefs?: No Depression: Yes Depression Symptoms: Isolating;Fatigue;Guilt;Loss of interest in usual pleasures;Feeling worthless/self pity;Feeling angry/irritable Substance abuse history and/or treatment for substance abuse?: Yes Suicide prevention information given to non-admitted patients: Not applicable  Risk to Others Homicidal Ideation: No Thoughts of Harm to Others: No Current Homicidal Intent: No Current Homicidal Plan: No Access to Homicidal Means: No Identified Victim: na History of harm to others?: No Assessment of Violence: None Noted Violent Behavior Description: cooperative and polite Does patient have access to weapons?: No Criminal Charges Pending?: No Does patient have a court date: No  Psychosis Hallucinations: None noted Delusions: None noted  Mental Status Report Appear/Hygiene: Disheveled Eye Contact: Fair Motor Activity: Restlessness Speech: Logical/coherent Level of Consciousness: Alert Mood: Depressed;Anxious Affect: Anxious;Appropriate to circumstance Anxiety Level: Moderate Thought Processes: Coherent Judgement: Unimpaired Orientation: Place;Person;Situation Obsessive Compulsive Thoughts/Behaviors: None  Cognitive Functioning Concentration: Decreased Memory: Recent Intact;Remote Intact IQ: Average Insight: Fair Impulse Control: Fair Appetite: Fair Weight Loss: 0  Weight Gain: 0  Sleep:  Decreased Total Hours of Sleep: 6  Vegetative Symptoms: None  ADLScreening [facility] Assessment Services) Patient's cognitive ability adequate to safely complete daily activities?: Yes Patient able to express need for assistance with ADLs?: Yes Independently performs ADLs?: Yes (appropriate for developmental age)  Abuse/Neglect [facility]) Physical Abuse: Denies Verbal Abuse: Denies Sexual Abuse: Denies  Prior Inpatient Therapy Prior Inpatient Therapy: Yes Prior Therapy Dates: 2012 [facility] Reason for Treatment: SI, SA  Prior Outpatient Therapy Prior Outpatient Therapy: No Prior Therapy Dates: na Prior Therapy Facilty/Provider(s): na Reason for Treatment: na  ADL Screening (condition at time of admission) Patient's cognitive ability adequate to safely complete daily activities?: Yes Patient able to express need for assistance with ADLs?: Yes Independently performs ADLs?: Yes (appropriate for developmental age) Weakness of Legs: None Weakness of Arms/Hands: None  Home Assistive Devices/Equipment Home Assistive Devices/Equipment: None  Therapy Consults (therapy consults require a physician order) PT Evaluation Needed: No OT Evalulation Needed: No SLP Evaluation Needed: No Abuse/Neglect Assessment (Assessment to be complete while patient is alone) Physical Abuse: Denies Verbal Abuse: Denies Sexual Abuse: Denies Exploitation of patient/patient's resources: Denies Self-Neglect: Denies Values / Beliefs Cultural Requests During Hospitalization: None Spiritual Requests During Hospitalization: None Consults Spiritual Care Consult Needed: No Social Work Consult Needed: No Merchant navy officer (For Healthcare) Advance Directive: Patient does not have advance directive Pre-existing out of facility DNR order (yellow form or pink MOST form): No Nutrition Screen- [facility] Patient's home diet: Regular Have you recently lost weight without trying?:  No Have you been eating poorly because of a decreased appetite?: No  Malnutrition Screening Tool Score: 0   Additional Information 1:1 In Past 12 Months?: No CIRT Risk: No Elopement Risk: No Does patient have medical clearance?: Yes     Disposition:  Recommend Inptx for detox and help with depression.  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  I want help elsewhere, not at the [facility]."    Disposition Disposition of Patient: Inpatient treatment program (detox with help for depression) Type of inpatient treatment program: Adult  On Site Evaluation by:   Reviewed with Physician:     Titus Mould, Eppie Gibson 11/30/2012 11:07 AM

## 2012-11-30 NOTE — ED Notes (Signed)
ACT into see 

## 2012-11-30 NOTE — BHH Counselor (Signed)
Attempted to assess pt within the AM, pt was very irritable and did not want to provide needed details; explained to pt how the questions were to assess treatment and pt still was not cooperative; pt Curtis Blankenship and states that he was diagnosed with severe PTSD; explained to pt that beds for treatment were on a first come basis and it would benefit him to answer questions now rather than later; pt continued to get upset; Cm explained to pt that ACT would revisit him later; explained this to his Nurse

## 2012-11-30 NOTE — ED Notes (Signed)
Resting quietly. Very limited interaction.

## 2012-11-30 NOTE — ED Notes (Signed)
Asleep. Resting quietly.

## 2012-11-30 NOTE — ED Notes (Signed)
Dr Hyacinth Meeker bedside to speak with pt

## 2012-11-30 NOTE — ED Provider Notes (Signed)
History     CSN: [id number]  Arrival date & time 11/30/12  0351   First MD Initiated Contact with Patient 11/30/12 0405      Chief Complaint  Patient presents with  . Drug / Alcohol Assessment    (Consider location/radiation/quality/duration/timing/severity/associated sxs/prior treatment) HPI Comments: 32 y/o male with a history of bipolar disorder and post traumatic stress disorder who also uses multiple substances including opiates, cocaine, methamphetamine and almost daily heavy alcohol use. He has been in detox in the past most recently several months ago but used immediately upon leaving. He states that he is suicidal to the point that he feels like he does not get into a detox facility he would inflict self-harm. He is not having active suicidal thoughts, he is not having hallucinations and is not physically feeling bad. He has not had much to eat in the last 3 days as he has been on a cocaine Bender.  He uses intravenously in his left arm and has had some redness.  Patient is a 32 y.o. male presenting with drug/alcohol assessment. The history is provided by the patient and a parent.  Drug / Alcohol Assessment    Past Medical History  Diagnosis Date  . Bipolar disorder   . PTSD (post-traumatic stress disorder)     Past Surgical History  Procedure Date  . Appendectomy     No family history on file.  History  Substance Use Topics  . Smoking status: Current Every Day Smoker -- 0.2 packs/day for 10 years    Types: Cigarettes  . Smokeless tobacco: Former Neurosurgeon    Types: Snuff  . Alcohol Use: 24.0 oz/week    40 Cans of beer per week      Review of Systems  All other systems reviewed and are negative.    Allergies  Review of patient's allergies indicates no known allergies.  Home Medications   Current Outpatient Rx  Name  Route  Sig  Dispense  Refill  . ASPIRIN 81 MG PO CHEW   Oral   Chew 1 tablet (81 mg total) by mouth daily.   30 tablet   0   . PRAZOSIN  HCL 2 MG PO CAPS   Oral   Take 2 mg by mouth 2 (two) times daily.         . VENLAFAXINE HCL ER 75 MG PO CP24   Oral   Take 225 mg by mouth daily.         Marland Kitchen ZOLPIDEM TARTRATE 10 MG PO TABS   Oral   Take 10 mg by mouth at bedtime as needed. For sleep.           BP 97/57  Pulse 67  Temp 98.5 F (36.9 C) (Oral)  Resp 18  Wt 180 lb (81.647 kg)  SpO2 97%  Physical Exam  Nursing note and vitals reviewed. Constitutional: He appears well-developed and well-nourished. No distress.  HENT:  Head: Normocephalic and atraumatic.  Mouth/Throat: Oropharynx is clear and moist. No oropharyngeal exudate.  Eyes: Conjunctivae normal and EOM are normal. Pupils are equal, round, and reactive to light. Right eye exhibits no discharge. Left eye exhibits no discharge. No scleral icterus.  Neck: Normal range of motion. Neck supple. No JVD present. No thyromegaly present.  Cardiovascular: Normal rate, regular rhythm, normal heart sounds and intact distal pulses.  Exam reveals no gallop and no friction rub.   No murmur heard. Pulmonary/Chest: Effort normal and breath sounds normal. No respiratory distress. He has  no wheezes. He has no rales.  Abdominal: Soft. Bowel sounds are normal. He exhibits no distension and no mass. There is no tenderness.  Musculoskeletal: Normal range of motion. He exhibits no edema and no tenderness.  Lymphadenopathy:    He has no cervical adenopathy.  Neurological: He is alert. Coordination normal.  Skin: Skin is warm and dry. Rash ( Erythema overlying his left forearm at the injection sites) noted. No erythema.  Psychiatric: He has a normal mood and affect. His behavior is normal.    ED Course  Procedures (including critical care time)  Labs Reviewed  CBC - Abnormal; Notable for the following:    WBC 12.0 (*)     All other components within normal limits  URINE RAPID DRUG SCREEN (HOSP PERFORMED) - Abnormal; Notable for the following:    Cocaine POSITIVE (*)      All other components within normal limits  COMPREHENSIVE METABOLIC PANEL  ETHANOL   No results found.   No diagnosis found.    MDM  The patient is requesting detox, he would be a reasonable thing for alcohol and opiates, he is not appear to be in any signs of withdrawal at this time and he does have erythema on his left forearm that would be consistent with an early cellulitis though he states that it started looking red after he missed his vein. We'll give antibiotics, alcohol withdrawal protocol, act team consult for placement.  Change of shift - care signed out to Cathren Laine, MD        Vida Roller, MD 12/01/12 [phone]

## 2012-11-30 NOTE — ED Notes (Signed)
Nursing assessment completed.

## 2012-11-30 NOTE — ED Notes (Signed)
Pt alert, arrives from home, requesting detox from drugs and alcohol, resp even unlabored, skin pwd

## 2012-12-01 ENCOUNTER — Encounter (HOSPITAL_COMMUNITY): Payer: Self-pay | Admitting: *Deleted

## 2012-12-01 ENCOUNTER — Inpatient Hospital Stay (HOSPITAL_COMMUNITY)
Admission: AD | Admit: 2012-12-01 | Discharge: 2012-12-08 | DRG: 897 | Disposition: A | Discharge status: Home / Self Care | Payer: Medicare Other | Source: Intra-hospital | Attending: Psychiatry | Admitting: Psychiatry

## 2012-12-01 DIAGNOSIS — F32A Depression, unspecified: Secondary | ICD-10-CM | POA: Diagnosis present

## 2012-12-01 DIAGNOSIS — F1020 Alcohol dependence, uncomplicated: Principal | ICD-10-CM

## 2012-12-01 DIAGNOSIS — Z79899 Other long term (current) drug therapy: Secondary | ICD-10-CM

## 2012-12-01 DIAGNOSIS — F329 Major depressive disorder, single episode, unspecified: Secondary | ICD-10-CM

## 2012-12-01 DIAGNOSIS — F1410 Cocaine abuse, uncomplicated: Secondary | ICD-10-CM

## 2012-12-01 DIAGNOSIS — F3289 Other specified depressive episodes: Secondary | ICD-10-CM | POA: Diagnosis present

## 2012-12-01 DIAGNOSIS — F4310 Post-traumatic stress disorder, unspecified: Secondary | ICD-10-CM

## 2012-12-01 DIAGNOSIS — Z7982 Long term (current) use of aspirin: Secondary | ICD-10-CM

## 2012-12-01 MED ORDER — NICOTINE 14 MG/24HR TD PT24
14.0000 mg | MEDICATED_PATCH | Freq: Every day | TRANSDERMAL | Status: DC
  Administered 2012-12-02 – 2012-12-08 (×7): 14 mg via TRANSDERMAL
  Filled 2012-12-01 (×9): qty 1

## 2012-12-01 MED ORDER — CHLORDIAZEPOXIDE HCL 25 MG PO CAPS
25.0000 mg | ORAL_CAPSULE | ORAL | Status: AC
  Administered 2012-12-03 (×2): 25 mg via ORAL
  Filled 2012-12-01 (×2): qty 1

## 2012-12-01 MED ORDER — ONDANSETRON 4 MG PO TBDP
4.0000 mg | ORAL_TABLET | Freq: Four times a day (QID) | ORAL | Status: AC | PRN
  Filled 2012-12-01: qty 1

## 2012-12-01 MED ORDER — CHLORDIAZEPOXIDE HCL 25 MG PO CAPS
25.0000 mg | ORAL_CAPSULE | Freq: Four times a day (QID) | ORAL | Status: AC
  Administered 2012-12-01 (×2): 25 mg via ORAL
  Filled 2012-12-01 (×2): qty 1

## 2012-12-01 MED ORDER — CHLORDIAZEPOXIDE HCL 25 MG PO CAPS
25.0000 mg | ORAL_CAPSULE | Freq: Three times a day (TID) | ORAL | Status: AC
  Administered 2012-12-02 (×3): 25 mg via ORAL
  Filled 2012-12-01 (×3): qty 1

## 2012-12-01 MED ORDER — CHLORDIAZEPOXIDE HCL 25 MG PO CAPS
25.0000 mg | ORAL_CAPSULE | Freq: Every day | ORAL | Status: AC
  Administered 2012-12-04: 25 mg via ORAL
  Filled 2012-12-01: qty 1

## 2012-12-01 MED ORDER — THIAMINE HCL 100 MG/ML IJ SOLN: 100.0000 mg | Freq: Once | INTRAMUSCULAR | Status: DC

## 2012-12-01 MED ORDER — ACETAMINOPHEN 325 MG PO TABS: 650.0000 mg | ORAL_TABLET | Freq: Four times a day (QID) | ORAL | Status: DC | PRN

## 2012-12-01 MED ORDER — ADULT MULTIVITAMIN W/MINERALS CH
1.0000 | ORAL_TABLET | Freq: Every day | ORAL | Status: DC
  Administered 2012-12-01 – 2012-12-08 (×8): 1 via ORAL
  Filled 2012-12-01 (×9): qty 1

## 2012-12-01 MED ORDER — LOPERAMIDE HCL 2 MG PO CAPS: 2.0000 mg | ORAL_CAPSULE | ORAL | Status: AC | PRN

## 2012-12-01 MED ORDER — VITAMIN B-1 100 MG PO TABS
100.0000 mg | ORAL_TABLET | Freq: Every day | ORAL | Status: DC
  Administered 2012-12-02 – 2012-12-08 (×7): 100 mg via ORAL
  Filled 2012-12-01 (×8): qty 1

## 2012-12-01 MED ORDER — ALUM & MAG HYDROXIDE-SIMETH 200-200-20 MG/5ML PO SUSP: 30.0000 mL | ORAL | Status: DC | PRN

## 2012-12-01 MED ORDER — MAGNESIUM HYDROXIDE 400 MG/5ML PO SUSP: 30.0000 mL | Freq: Every day | ORAL | Status: DC | PRN

## 2012-12-01 MED ORDER — CHLORDIAZEPOXIDE HCL 25 MG PO CAPS: 25.0000 mg | ORAL_CAPSULE | Freq: Four times a day (QID) | ORAL | Status: DC | PRN

## 2012-12-01 MED ORDER — ZOLPIDEM TARTRATE 10 MG PO TABS
10.0000 mg | ORAL_TABLET | Freq: Every evening | ORAL | Status: DC | PRN
  Administered 2012-12-01 – 2012-12-07 (×6): 10 mg via ORAL
  Filled 2012-12-01 (×6): qty 1

## 2012-12-01 MED ORDER — HYDROXYZINE HCL 25 MG PO TABS
25.0000 mg | ORAL_TABLET | Freq: Four times a day (QID) | ORAL | Status: DC | PRN
  Administered 2012-12-02 – 2012-12-03 (×3): 25 mg via ORAL

## 2012-12-01 MED ORDER — CHLORDIAZEPOXIDE HCL 25 MG PO CAPS
50.0000 mg | ORAL_CAPSULE | Freq: Once | ORAL | Status: AC
  Administered 2012-12-01: 50 mg via ORAL
  Filled 2012-12-01: qty 2

## 2012-12-01 NOTE — Progress Notes (Signed)
Pt was admitted from ED wanting detox from alcohol.  He has been using over a year.  He has been using daily around a case of beer/day.  He admits to sporadic use of cocaine and THC.  Pt wants to go to long-term treatment. He denied S/H ideation but did state,"I am not planning to hurt myself but if I die that's okay".  Pt denied any pertinent medical history except for bipolar, PTSD,and an appendectomy.  Pt is a Sales executive.  He wanted to have help outside the [facility] hospital.  He was shown the unit rules and he voiced understanding.  He was given a loading dose of librium.

## 2012-12-01 NOTE — B[facility] Counselor (Signed)
Referral faxed to [facility] as beds are available.   Evette Cristal, [place] Assessment Counselor

## 2012-12-01 NOTE — BHH Counselor (Signed)
Patient accepted to [facility] 12/01/2012 by Assunta Found, NP to Geoffery Lyons, MD Room 303-2. [facility] will be ready for patients admission approx. 0100. Writer notified EDP- Whitney Plunkett of patients acceptance. Patients nurse also made aware of patients disposition and call report # is [phone]. All support paperwork completed and faxed to [facility].

## 2012-12-01 NOTE — Tx Team (Signed)
Initial Interdisciplinary Treatment Plan  PATIENT STRENGTHS: (choose at least two) Average or above average intelligence Motivation for treatment/growth  PATIENT STRESSORS: Financial difficulties Marital or family conflict Medication change or noncompliance Substance abuse   PROBLEM LIST: Problem List/Patient Goals Date to be addressed Date deferred Reason deferred Estimated date of resolution  Substance Abuse                                                       DISCHARGE CRITERIA:  Ability to meet basic life and health needs Adequate post-discharge living arrangements Improved stabilization in mood, thinking, and/or behavior Motivation to continue treatment in a less acute level of care Need for constant or close observation no longer present Reduction of life-threatening or endangering symptoms to within safe limits Safe-care adequate arrangements made Verbal commitment to aftercare and medication compliance Withdrawal symptoms are absent or subacute and managed without 24-hour nursing intervention  PRELIMINARY DISCHARGE PLAN: Attend aftercare/continuing care group  PATIENT/FAMIILY INVOLVEMENT: This treatment plan has been presented to and reviewed with the patient, Curtis Blankenship. The patient and family have been given the opportunity to ask questions and make suggestions.  Jule Ser 12/01/2012, 11:43 AM

## 2012-12-01 NOTE — Progress Notes (Signed)
Adult Psychoeducational Group Note  Date:  12/01/2012 Time:  1:59 PM  Group Topic/Focus:  Self Care:   The focus of this group is to help patients understand the importance of self-care in order to improve or restore emotional, physical, spiritual, interpersonal, and financial health.  Participation Level:  Did Not Attend  Participation Quality:  N/A  Affect:  N/A  Cognitive:  N/A  Insight: None  Engagement in Group:  N/A  Modes of Intervention:  N/A  Additional Comments:  Per MD, pt did not attend group due to detox symptoms.  Reinaldo Raddle K 12/01/2012, 1:59 PM
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
## 2012-12-02 DIAGNOSIS — F1920 Other psychoactive substance dependence, uncomplicated: Secondary | ICD-10-CM

## 2012-12-02 MED ORDER — ZOLPIDEM TARTRATE 10 MG PO TABS: 10.0000 mg | ORAL_TABLET | Freq: Every evening | ORAL | Status: DC | PRN

## 2012-12-02 MED ORDER — CHLORDIAZEPOXIDE HCL 25 MG PO CAPS: 25.0000 mg | ORAL_CAPSULE | Freq: Four times a day (QID) | ORAL | Status: AC | PRN

## 2012-12-02 MED ORDER — CHLORDIAZEPOXIDE HCL 25 MG PO CAPS: 25.0000 mg | ORAL_CAPSULE | Freq: Four times a day (QID) | ORAL | Status: DC | PRN

## 2012-12-02 NOTE — Progress Notes (Signed)
[facility] MD Progress Note  12/02/2012 5:45 PM Curtis Blankenship  MRN:  [id number] Subjective:  Having it "rough." Did not sleep well last night. Feeling hot then cold, shaky. He was abusing a lot of opioids, then ran out of them, then drank a lot. He states that he has to take care of this now. He endorses "severe PTSD," also endorses OCD symptoms. When he was treated before, he was given a lot of medications for what he does not know what worked or didn't. Did endorse a lot of side effects, sedation. He admits he needs to be alert (hyperalert feels "paranoid" around people.) From here he wants to go to a rehab program. Does not see himself as able to make it if he was not to go Diagnosis:  Polysubstance Dependence including Opioids, PTSD, Depressive Disorder NOS  ADL's:  Intact  Sleep: Poor  Appetite:  Poor  Suicidal Ideation:  Plan:  Denies Intent:  Denies Means:  Denies Homicidal Ideation:  Plan:  Denies Intent:  denies Means:  Denies AEB (as evidenced by):  Psychiatric Specialty Exam: Review of Systems  Constitutional: Positive for diaphoresis.  HENT: Negative.   Eyes: Negative.   Respiratory: Negative.   Cardiovascular: Negative.   Gastrointestinal: Positive for nausea.  Genitourinary: Negative.   Musculoskeletal: Positive for myalgias.  Skin: Negative.   Neurological: Positive for tremors and weakness.  Endo/Heme/Allergies: Negative.   Psychiatric/Behavioral: Positive for depression and substance abuse. The patient is nervous/anxious and has insomnia.     Blood pressure 113/69, pulse 73, temperature 98.3 F (36.8 C), temperature source Oral, resp. rate 18.There is no height or weight on file to calculate BMI.  General Appearance: Disheveled  Eye Contact::  Minimal  Speech:  Clear and Coherent  Volume:  Normal  Mood:  Anxious, Depressed and Dysphoric  Affect:  Restricted and irritated  Thought Process:  Coherent and Goal Directed  Orientation:  Full (Time, Place, and  Person)  Thought Content:  worries, concerns, symptoms  Suicidal Thoughts:  No  Homicidal Thoughts:  No  Memory:  Immediate;   Fair Recent;   Fair Remote;   Fair  Judgement:  Fair  Insight:  Present  Psychomotor Activity:  Restlessness  Concentration:  Fair  Recall:  Fair  Akathisia:  No  Handed:  Right  AIMS (if indicated):     Assets:  Desire for Improvement  Sleep:  Number of Hours: 3    Current Medications: Current Facility-Administered Medications  Medication Dose Route Frequency Provider Last Rate Last Dose  . acetaminophen (TYLENOL) tablet 650 mg  650 mg Oral Q6H PRN Rachael Fee, MD      . alum & mag hydroxide-simeth (MAALOX/MYLANTA) 200-200-20 MG/5ML suspension 30 mL  30 mL Oral Q4H PRN Rachael Fee, MD      . chlordiazePOXIDE (LIBRIUM) capsule 25 mg  25 mg Oral Q6H PRN Rachael Fee, MD      . chlordiazePOXIDE (LIBRIUM) capsule 25 mg  25 mg Oral TID Rachael Fee, MD   25 mg at 12/02/12 1209   Followed by  . chlordiazePOXIDE (LIBRIUM) capsule 25 mg  25 mg Oral BH-qamhs Rachael Fee, MD       Followed by  . chlordiazePOXIDE (LIBRIUM) capsule 25 mg  25 mg Oral Daily Rachael Fee, MD      . hydrOXYzine (ATARAX/VISTARIL) tablet 25 mg  25 mg Oral Q6H PRN Rachael Fee, MD   25 mg at 12/02/12 1439  . loperamide (IMODIUM) capsule  2-4 mg  2-4 mg Oral PRN Rachael Fee, MD      . magnesium hydroxide (MILK OF MAGNESIA) suspension 30 mL  30 mL Oral Daily PRN Rachael Fee, MD      . multivitamin with minerals tablet 1 tablet  1 tablet Oral Daily Rachael Fee, MD   1 tablet at 12/02/12 [phone]  . nicotine (NICODERM CQ - dosed in mg/24 hours) patch 14 mg  14 mg Transdermal Q0600 Rachael Fee, MD   14 mg at 12/02/12 9604  . ondansetron (ZOFRAN-ODT) disintegrating tablet 4 mg  4 mg Oral Q6H PRN Rachael Fee, MD      . thiamine (B-1) injection 100 mg  100 mg Intramuscular Once Rachael Fee, MD      . thiamine (VITAMIN B-1) tablet 100 mg  100 mg Oral Daily Rachael Fee, MD   100 mg  at 12/02/12 5409  . zolpidem (AMBIEN) tablet 10 mg  10 mg Oral QHS PRN Rachael Fee, MD   10 mg at 12/01/12 2333    Lab Results: No results found for this or any previous visit (from the past 48 hour(s)).  Physical Findings: AIMS: Facial and Oral Movements Muscles of Facial Expression: None, normal Lips and Perioral Area: None, normal Jaw: None, normal Tongue: None, normal,Extremity Movements Upper (arms, wrists, hands, fingers): None, normal Lower (legs, knees, ankles, toes): None, normal, Trunk Movements Neck, shoulders, hips: None, normal, Overall Severity Severity of abnormal movements (highest score from questions above): None, normal Incapacitation due to abnormal movements: None, normal Patient's awareness of abnormal movements (rate only patient's report): No Awareness, Dental Status Current problems with teeth and/or dentures?: No Does patient usually wear dentures?: No  CIWA:  CIWA-Ar Total: 2  COWS:  COWS Total Score: 7   Treatment Plan Summary: Daily contact with patient to assess and evaluate symptoms and progress in treatment Medication management  Plan: Supportive approach/coping skills/relapse prevention           Continue Librium Detox           Reassess treatment for his PTSD, OCD  Medical Decision Making Problem Points:  Established problem, worsening (2) and Review of psycho-social stressors (1) Data Points:  Review of medication regiment & side effects (2) Review of new medications or change in dosage (2)  I certify that inpatient services furnished can reasonably be expected to improve the patient's condition.   Sam Overbeck A 12/02/2012, 5:45 PM

## 2012-12-02 NOTE — Progress Notes (Signed)
[facility] Group Notes:  (Nursing/MHT/Case Management/Adjunct)  Type of Therapy:  Psychoeducational Skills  Participation Level:  Minimal  Participation Quality:  Resistant  Affect:  Irritable  Cognitive:  Appropriate and Oriented  Insight:  None  Engagement in Group:  Limited and Resistant  Modes of Intervention:  Activity, Clarification, Discussion, Education, Limit-setting, Problem-solving, Rapport Building, Socialization and Support  Summary of Progress/Problems: "Curtis Blankenship" attended psychoeducational group on labels. "Curtis Blankenship" participated in an activity labeling self and peers and choose to label himself as a "Carnie" for the activity. "Curtis Blankenship" was quiet but irritable when resistant to writer when prompted while group discussed what labels are, how we use them, how they affect the way we think about and perceive the world, and listed positive and negative labels they have used or been called. "Curtis Blankenship" stated negative labels he has dealt with are addict and junkie. "Curtis Blankenship" was given homework assignment to list 10 words he has been labeled and to find the reality of the situation/label.    Wandra Scot 12/02/2012 1:18 PM

## 2012-12-02 NOTE — Progress Notes (Signed)
[facility] LCSW Aftercare Discharge Planning Group Note  12/02/2012 12:50 PM  Participation Quality:  Hesitant  Affect:  Depressed and Flat  Cognitive:  Oriented  Insight:  Limited  Engagement in Group:  Limited  Modes of Intervention:  Clarification, Exploration, Rapport Building and Support  Summary of Progress/Problems:  Patient prefers to be called Curtis Blankenship and reports he wants to access [facility] services for Substance Abuse Program. Patient reports he had SA through [place] in 2012.  He is willing to provide own transportation and regrets he was not referred there from ED.   [name] 12/02/2012, 12:50 PM

## 2012-12-02 NOTE — Progress Notes (Signed)
[facility] LCSW Group Therapy  12/02/2012 4:27 PM  Type of Therapy: Group Therapy at 1:15  Participation Level: Active   Participation Quality: Appropriate and Attentive   Affect: Appropriate   Cognitive: Alert and Appropriate   Insight: Developing/Improving   Engagement in Therapy: Developing/Improving   Modes of Intervention: Activity, Clarification, Discussion, Education, Socialization and Support   Summary of Progress/Problems: Patient was attentive and engaged with speaker from [employer]. Curtis Blankenship expressed no interest in the program or services.   [name] 12/02/2012, 4:27 PM

## 2012-12-02 NOTE — Progress Notes (Signed)
D:  Patient up and active in the milieu today.  Complained of anxiety earlier today and received hydroxyzine.  Rates depression, anxiety, and hopelessness at an 8.  Admits to having suicidal thoughts at times, but agrees to seek out staff if feeling unsafe.  Appetite is good.  A:  Encouraged participation in groups.  Medicated as ordered.  Hydroxyzine given for anxiety not relieved with Librium.   R:  Pleasant and cooperative.  Interacting well with staff and peers, but somewhat guarded.  Hydroxyzine effective for anxiety.

## 2012-12-02 NOTE — Progress Notes (Signed)
Adult Psychoeducational Group Note  Date:  12/02/2012 Time:  2000  Group Topic/Focus:  The Sober Life: Dedicating Yourself to Sobriety  Participation Level:  Did Not Attend  Participation Quality:    Affect:    Cognitive:    Insight:   Engagement in Group:    Modes of Intervention:    Additional Comments:  Pt was in bed asleep during group.   Humberto Seals Monique 12/02/2012, 10:20 PM

## 2012-12-02 NOTE — Progress Notes (Signed)
Adult Psychoeducational Group Note  Date:  12/02/2012 Time:  1:25 PM  Group Topic/Focus:  Recovery Goals:   The focus of this group is to identify appropriate goals for recovery and establish a plan to achieve them.  Participation Level:  Minimal  Participation Quality:  Appropriate and Redirectable  Affect:  Appropriate and Flat  Cognitive:  Alert  Insight: Limited  Engagement in Group:  Limited and Supportive  Modes of Intervention:  Discussion, Education, Limit-setting and Support  Additional Comments:  Pt attended group, but active participation in group discussion was limited. Upon being asked by staff to identify his favorite activity as an ice-breaker at beginning of group, pt reported that his favorite activity was "getting high." Pt made no further contributions to group discussion, but listened well as others shared.  Reinaldo Raddle K 12/02/2012, 1:25 PM

## 2012-12-03 MED ORDER — DIVALPROEX SODIUM 250 MG PO DR TAB
250.0000 mg | DELAYED_RELEASE_TABLET | Freq: Two times a day (BID) | ORAL | Status: DC
  Administered 2012-12-03 – 2012-12-05 (×4): 250 mg via ORAL
  Filled 2012-12-03 (×6): qty 1

## 2012-12-03 MED ORDER — TRAZODONE HCL 50 MG PO TABS
50.0000 mg | ORAL_TABLET | Freq: Every evening | ORAL | Status: DC | PRN
  Administered 2012-12-03: 50 mg via ORAL

## 2012-12-03 MED ORDER — MIRTAZAPINE 30 MG PO TABS
30.0000 mg | ORAL_TABLET | Freq: Every day | ORAL | Status: DC
  Administered 2012-12-03 – 2012-12-07 (×5): 30 mg via ORAL
  Filled 2012-12-03 (×6): qty 1

## 2012-12-03 MED ORDER — DESVENLAFAXINE SUCCINATE ER 50 MG PO TB24
50.0000 mg | ORAL_TABLET | Freq: Every day | ORAL | Status: DC
  Administered 2012-12-03 – 2012-12-08 (×6): 50 mg via ORAL
  Filled 2012-12-03 (×8): qty 1

## 2012-12-03 MED ORDER — HYDROXYZINE HCL 25 MG PO TABS
25.0000 mg | ORAL_TABLET | Freq: Four times a day (QID) | ORAL | Status: DC | PRN
  Administered 2012-12-03 – 2012-12-07 (×8): 25 mg via ORAL
  Filled 2012-12-03: qty 1

## 2012-12-03 NOTE — Progress Notes (Signed)
[facility] MD Progress Note  12/03/2012 3:38 PM Curtis Blankenship  MRN:  [id number] Subjective:  Worried about his persistent cravings for alcohol. Also worried about his  hyper alertness, lack of trust, his suspiciousness "paranoia.". Understands that a lot of these symptoms are part of the PTSD. He sees the PTSD as triggering his use. He starts drinking and drugs follow. He admits that his mother is tired of his going back to using over and over again (states that he is tired of himself) he is wanting to go to a longer term residential treatment program. He has been to shorter term programs and does not think they help that much. He is willing to consider medications. He is concerned about his irritability, his loss of control. (says that Depakote did help in the past) Diagnosis:  PTSD, Depressive Disorder NOS, Alcohol Dependence, Cocaine abuse  ADL's:  Intact  Sleep: Poor  Appetite:  Fair  Suicidal Ideation:  Plan:  Denies Intent:  Denies Means:  Denies Homicidal Ideation:  Plan:  Denies Intent:  Denies Means:  Denies AEB (as evidenced by):  Psychiatric Specialty Exam: Review of Systems  Constitutional: Negative.   HENT: Negative.   Eyes: Negative.   Respiratory: Negative.   Cardiovascular: Negative.   Gastrointestinal: Negative.   Genitourinary: Negative.   Musculoskeletal: Negative.   Skin: Negative.   Neurological: Negative.   Endo/Heme/Allergies: Negative.   Psychiatric/Behavioral: Positive for depression and substance abuse. The patient is nervous/anxious and has insomnia.     Blood pressure 129/88, pulse 77, temperature 98.8 F (37.1 C), temperature source Oral, resp. rate 16.There is no height or weight on file to calculate BMI.  General Appearance: Fairly Groomed  Patent attorney::  Minimal  Speech:  Clear and Coherent, Slow and not spontaneous  Volume:  varies during the session  Mood:  Anxious, Depressed and Irritable  Affect:  Restricted  Thought Process:  Coherent and  Goal Directed  Orientation:  Full (Time, Place, and Person)  Thought Content:  Rumination and worries, concerns, fear of long control  Suicidal Thoughts:  No  Homicidal Thoughts:  No  Memory:  Immediate;   Fair Recent;   Fair Remote;   Fair  Judgement:  Fair  Insight:  Present  Psychomotor Activity:  Restlessness  Concentration:  Fair  Recall:  Fair  Akathisia:  No  Handed:  Right  AIMS (if indicated):     Assets:  Desire for Improvement  Sleep:  Number of Hours: 0.75    Current Medications: Current Facility-Administered Medications  Medication Dose Route Frequency Provider Last Rate Last Dose  . acetaminophen (TYLENOL) tablet 650 mg  650 mg Oral Q6H PRN Rachael Fee, MD      . alum & mag hydroxide-simeth (MAALOX/MYLANTA) 200-200-20 MG/5ML suspension 30 mL  30 mL Oral Q4H PRN Rachael Fee, MD      . chlordiazePOXIDE (LIBRIUM) capsule 25 mg  25 mg Oral BH-qamhs Rachael Fee, MD   25 mg at 12/03/12 4098   Followed by  . chlordiazePOXIDE (LIBRIUM) capsule 25 mg  25 mg Oral Daily Rachael Fee, MD      . chlordiazePOXIDE (LIBRIUM) capsule 25 mg  25 mg Oral Q6H PRN Rachael Fee, MD      . desvenlafaxine (PRISTIQ) 24 hr tablet 50 mg  50 mg Oral Daily Rachael Fee, MD      . divalproex (DEPAKOTE) DR tablet 250 mg  250 mg Oral BID Rachael Fee, MD      .  hydrOXYzine (ATARAX/VISTARIL) tablet 25 mg  25 mg Oral Q6H PRN Rachael Fee, MD      . loperamide (IMODIUM) capsule 2-4 mg  2-4 mg Oral PRN Rachael Fee, MD      . magnesium hydroxide (MILK OF MAGNESIA) suspension 30 mL  30 mL Oral Daily PRN Rachael Fee, MD      . mirtazapine (REMERON) tablet 30 mg  30 mg Oral QHS Rachael Fee, MD      . multivitamin with minerals tablet 1 tablet  1 tablet Oral Daily Rachael Fee, MD   1 tablet at 12/03/12 [phone]  . nicotine (NICODERM CQ - dosed in mg/24 hours) patch 14 mg  14 mg Transdermal Q0600 Rachael Fee, MD   14 mg at 12/03/12 1191  . ondansetron (ZOFRAN-ODT) disintegrating tablet 4 mg   4 mg Oral Q6H PRN Rachael Fee, MD      . thiamine (B-1) injection 100 mg  100 mg Intramuscular Once Rachael Fee, MD      . thiamine (VITAMIN B-1) tablet 100 mg  100 mg Oral Daily Rachael Fee, MD   100 mg at 12/03/12 4782  . zolpidem (AMBIEN) tablet 10 mg  10 mg Oral QHS PRN Rachael Fee, MD   10 mg at 12/02/12 2138    Lab Results: No results found for this or any previous visit (from the past 48 hour(s)).  Physical Findings: AIMS: Facial and Oral Movements Muscles of Facial Expression: None, normal Lips and Perioral Area: None, normal Jaw: None, normal Tongue: None, normal,Extremity Movements Upper (arms, wrists, hands, fingers): None, normal Lower (legs, knees, ankles, toes): None, normal, Trunk Movements Neck, shoulders, hips: None, normal, Overall Severity Severity of abnormal movements (highest score from questions above): None, normal Incapacitation due to abnormal movements: None, normal Patient's awareness of abnormal movements (rate only patient's report): No Awareness, Dental Status Current problems with teeth and/or dentures?: No Does patient usually wear dentures?: No  CIWA:  CIWA-Ar Total: 4  COWS:  COWS Total Score: 7   Treatment Plan Summary: Daily contact with patient to assess and evaluate symptoms and progress in treatment Medication management  Plan: Supportive approach/coping skills/relapse prevention           Will start Pristiq 50 mg in AM                            Depakote ER 250 mg BID                            Remeron 30 mg HS   Medical Decision Making Problem Points:  Review of psycho-social stressors (1) Data Points:  Review of medication regiment & side effects (2) Review of new medications or change in dosage (2)  I certify that inpatient services furnished can reasonably be expected to improve the patient's condition.   Norlene Lanes A 12/03/2012, 3:38 PM

## 2012-12-03 NOTE — Progress Notes (Signed)
Psychoeducational Group Note  Date:  12/03/2012 Time:  2000  Group Topic/Focus:  NA  Participation Level: Did Not Attend  Participation Quality:  Not Applicable  Affect:  Not Applicable  Cognitive:  Not Applicable  Insight:  Not Applicable  Engagement in Group: Not Applicable  Additional Comments:    Flonnie Hailstone 12/03/2012, 9:54 PM

## 2012-12-03 NOTE — Progress Notes (Signed)
[facility] LCSW Aftercare Discharge Planning Group Note  12/03/2012   Participation Quality:  Resistant  Affect:  Irritable  Cognitive:  Alert and Oriented  Insight:  Poor  Engagement in Group:  Resistant  Modes of Intervention:  Exploration, Limit-setting and Rapport Building  Summary of Progress/Problems:  Patient was irritable and acknowledged the fact although he was rude.  Responsive to limits set. Patient reports he only wants to go to the "Inpatient place on [address]."  [name] declined to engage in further conversation.   [name] 12/03/2012,

## 2012-12-03 NOTE — Progress Notes (Addendum)
D: Patient having passive SI but verbally agrees to contract for safety. The patient denies HI and auditory and visual hallucinations. The patient reports sleeping poorly and states that he needs "something else" to help him rest (patient currently receiving ambien and trazodone). Patient reports feeling depressed today but states that he is trying to "work through it."  Patient is also frequently stating that he is irritable and feeling agitated. Patient jokes that he needs "alcohol and benadryl to calm down." Patient is attending groups and interacting appropriately within the milieu.  A: Patient given emotional support from RN. Patient encouraged to come to staff with concerns and/or questions. Patient's medication routine continued. Patient's orders and plan of care reviewed.  R: Patient remains appropriate and cooperative. Will continue to monitor patient q15 minutes for safety.

## 2012-12-03 NOTE — Tx Team (Signed)
Interdisciplinary Treatment Plan Update (Adult)  Date: 12/03/2012  Time Reviewed: 9:24 AM   Progress in Treatment:  Attending groups: Inconsistently Participating in groups: Inconsistent Taking medication as prescribed: Yes.  Tolerating medication: Yes.  Family/Significant othe contact made: No, patient refusing consent Patient understands diagnosis: Yes.  Discussing patient identified problems/goals with staff: Yes.  Medical problems stabilized or resolved: Yes.  Denies suicidal/homicidal ideation: Yes.  Issues/concerns per patient self-inventory: Passive SI.  Other:   New problem(s) identified: No, Describe:  Discharge Plan or Barriers:  Placement dates at [facility] are out 10 days  Reason for Continuation of Hospitalization:  Suicidal ideation Withdrawal symptoms Other; describe Irritability   Comments:   Estimated length of stay: 3-4 days  New goal(s):  Review of initial/current patient goals per problem list:  1. Goal: Patient will be able to identify effective and ineffective coping patterns   Met: No   Target Date: Discharge Date  As evidenced by: Group Attendance and Participation 2. Goal(s): Address suicidal ideation  Met: No  Target date: Discharge date As evidenced by: Patient report and suicide prevention education or transfer to another facility 3. Goal (s): Reduce depressive symptoms from a 10 to a 3  Met: No  Target date: Discharge Date As evidenced by: Pt rates at a 7 4. Complete Detox Protocol and Identify comprehensive mental wellness and sobriety plan  Met: No  Target date: Discharge Date As evidenced ZO:XWRU report, with support of CSW setting up follow up care   Attendees:  Patient:  2/5/20149:24 AM   Family:  2/5/20149:24 AM   Physician: Geoffery Lyons,  2/5/20149:24 AM   Nursing: Roswell Miners, RN  2/5/20149:24 AM   Case Manager: Ronda Fairly, LCSWA  2/5/20149:24 AM   Other: Nestor Ramp, RN  2/5/20149:24 AM   Other: Lucia Estelle, NP   2/5/20149:24 AM   Other: Olivia Mackie, Psych Intern  2/5/20149:24 AM   Other: Jonell Cluck, Nursing Student  2/5/20149:24 AM   Other: Leo Rod, PA Intern  2/5/20149:24 AM   Other:  2/5/20149:24 AM   Other:  2/5/20149:24 AM   Other:  2/5/20149:24 AM   Other:  2/5/20149:24 AM   Other:  2/5/20149:24 AM   Other:  2/5/20149:24 AM   Scribe for Treatment Team:  [name], 12/03/2012, 9:24 AM

## 2012-12-03 NOTE — [facility] Counselor (Signed)
Adult Comprehensive Assessment  Patient ID: Curtis Blankenship, male   DOB: July 19, 1981, 32 y.o.   MRN: [id number]  Information Source: Information source: Patient  Current Stressors:  Educational / Learning stressors: N/A Employment / Job issues: Unemployed, 100% disabled through the [place] Family Relationships: Strained relationship with family Surveyor, quantity / Lack of resources (include bankruptcy): N/A Housing / Lack of housing: No home, reports he will be on the streets when he leaves here Physical health (include injuries & life threatening diseases): 100% disabled from combat Social relationships: No support Substance abuse: Reports using everything - cocaine, alcohol, opiates, stimulants Bereavement / Loss: N/A  Living/Environment/Situation:  Living Arrangements: Other relatives (reports that he is not welcome back at sister's house) How long has patient lived in current situation?: 1 month What is atmosphere in current home: Temporary  Family History:  Marital status: Single Does patient have children?: No  Childhood History:  By whom was/is the patient raised?: Both parents Additional childhood history information: Pt reports having a decent childhood Description of patient's relationship with caregiver when they were a child: Pt reports getting along with his family "so-so" growing up Patient's description of current relationship with people who raised him/her: Pt states that his relationship is strained with family.   Does patient have siblings?: Yes Number of Siblings: 2  Description of patient's current relationship with siblings: 1 brother, 1 sister.  Relationship is strained with siblings.  Did patient suffer any verbal/emotional/physical/sexual abuse as a child?: No Did patient suffer from severe childhood neglect?: No Has patient ever been sexually abused/assaulted/raped as an adolescent or adult?: No Witnessed domestic violence?: No Has patient been effected by domestic  violence as an adult?: No  Education:  Highest grade of school patient has completed: Some college Currently a student?: No Learning disability?: No  Employment/Work Situation:   Employment situation: On disability Why is patient on disability: Pt reports being 100% disabled through the [place] due to Traumatic Brain Injury and PTSD.   How long has patient been on disability: 4-5 years Patient's job has been impacted by current illness: No What is the longest time patient has a held a job?: [employer] Has patient ever been in the [employer]?: Yes (Describe in comment) Garment/textile technologist) Has patient ever served in combat?: Yes Patient description of combat service: served in [place]  Financial Resources:   Financial resources: Insurance underwriter Does patient have a Lawyer or guardian?: No  Alcohol/Substance Abuse:   What has been your use of drugs/alcohol within the last 12 months?: Alcohol - daily, reports cocaine, opiate and stimulant use but couldn't tell how much If attempted suicide, did drugs/alcohol play a role in this?: No Alcohol/Substance Abuse Treatment Hx: Past detox;Past Tx, Outpatient;Past Tx, Inpatient If yes, describe treatment: Reports outpatient services through the [place] and inpatient at the [place] in [place], [place] and [place].  Has alcohol/substance abuse ever caused legal problems?: No  Social Support System:   Patient's Community Support System: Poor Describe Community Support System: Pt reports not support.   Type of faith/religion: None reported How does patient's faith help to cope with current illness?: N/A  Leisure/Recreation:   Leisure and Hobbies: Pt states "getting high"  Strengths/Needs:   What things does the patient do well?: Pt reports "drinking" In what areas does patient struggle / problems for patient: Substance use  Discharge Plan:   Does patient have access to transportation?: No Plan for no access to transportation at discharge: pt reports no  transportation, CSW will assess Will patient  be returning to same living situation after discharge?: No Plan for living situation after discharge: Can't return to sister's, will have to find alternate living arrangements Currently receiving community mental health services: Yes (From Whom) (the [place]) If no, would patient like referral for services when discharged?: Yes (What county?) (would like inpatient treatment) Does patient have financial barriers related to discharge medications?: No  Summary/Recommendations:    Patient is a 32 year old Caucasian Male with a diagnosis of Alcohol Abuse and MDD.  Patient was living with sister on admission in [place] but reports he can't return there.  Patient will benefit from crisis stabilization, medication evaluation, group therapy and psycho education in addition to case management for discharge planning.    Horton, Salome Arnt. 12/03/2012

## 2012-12-03 NOTE — Progress Notes (Signed)
[facility] LCSW Group Therapy  12/03/2012   Type of Therapy:  Group Therapy at 1:15  Participation Level:  Did Not Attend    Curtis Blankenship 12/03/2012,

## 2012-12-03 NOTE — Progress Notes (Signed)
Adult Psychoeducational Group Note  Date:  12/03/2012 Time:  11:51 AM  Group Topic/Focus:  Gratitude group. Group consisted of going over the gratitude worksheet and discussion personal views about the topic. Group ended with reading the graditutde prayer and talking about the value of saying it everyday.  Participation Level:  Minimal  Participation Quality:  Drowsy  Affect:  Blunted  Cognitive:  Oriented  Insight: Good  Engagement in Group:  Lacking. Did not speak or volunteer to read contents of worksheet.  Modes of Intervention:  Discussion, Education and Support  Additional Comments:  Pt indicated that that he would like to be called Curtis Blankenship. Pt stated he wants to work on his sub abuse while in the hospital.  [name] T 12/03/2012, 11:51 AM

## 2012-12-03 NOTE — Progress Notes (Signed)
Pt observed in his room in bed reading.  Pt did not have any meds scheduled tonight, but did want a sleep aid.  He took Vistaril for anxiety, but said it did not work earlier and that he was using drugs heavily before admission.  He feels he needs something stronger.  Encouraged pt to discuss his concerns with the MD in the AM.  He appears irritable, but he is composed while talking with this Clinical research associate.  He contracts for safety.  He denies HI/AV.  He has only attended a few groups today.  He wants long term treatment after discharge from [facility].  Pt makes his needs known to staff.  Safety maintained with q15 minute checks.

## 2012-12-04 MED ORDER — ONDANSETRON HCL 4 MG PO TABS
4.0000 mg | ORAL_TABLET | Freq: Three times a day (TID) | ORAL | Status: DC | PRN
  Administered 2012-12-04: 4 mg via ORAL

## 2012-12-04 NOTE — Clinical Social Work Note (Signed)
[facility] LCSW Group Therapy  12/04/2012   Type of Therapy:  Group Therapy at 1:15  Participation Level:  None other than attendance for the last 5 to 10 minutes of group  [name] 12/04/2012, 3:42 PM

## 2012-12-04 NOTE — Progress Notes (Signed)
[facility] MD Progress Note  12/04/2012 3:21 PM Curtis Blankenship  MRN:  [id number] Subjective:  Curtis Blankenship is still having mood instability, anxiety. He is having some nausea. He was started on Depakote yesterday. So far tolerating well. He is having nightmares. He used to take Prazosin at night and used to be effective. After a while states the Prazosin quit working as well and the dosage had to be increased, and it was never as effective as when he started using it. The nightmares he states he feels are always going to be with him due to his PTSD. He will be receptive to anything that could improve them.   Diagnosis:  Axis I: Depressive Disorder NOS and Post Traumatic Stress Disorder, Cocaine abuse, Alcohol Dependence  ADL's:  Intact  Sleep: Poor  Appetite:  Fair  Suicidal Ideation:  Plan:  denies Intent:  denies Means:  denies Homicidal Ideation:  Plan:  denies Intent:  denies Means:  denies AEB (as evidenced by):  Psychiatric Specialty Exam: Review of Systems  Constitutional: Negative.   HENT: Negative.   Eyes: Negative.   Respiratory: Negative.   Cardiovascular: Negative.   Gastrointestinal: Positive for nausea.  Genitourinary: Negative.   Musculoskeletal: Negative.   Skin: Negative.   Neurological: Negative.   Endo/Heme/Allergies: Negative.   Psychiatric/Behavioral: Positive for depression and substance abuse. The patient is nervous/anxious and has insomnia.     Blood pressure 124/86, pulse 92, temperature 98.8 F (37.1 C), temperature source Oral, resp. rate 16, height 5\' 10"  (1.778 m), weight 79.379 kg (175 lb).Body mass index is 25.11 kg/(m^2).  General Appearance: Disheveled  Eye Solicitor::  Fair  Speech:  Clear and Coherent  Volume:  Decreased  Mood:  Anxious, Depressed and worried  Affect:  Restricted  Thought Process:  Coherent and Goal Directed  Orientation:  Full (Time, Place, and Person)  Thought Content:  Rumination and worries, concerns  Suicidal Thoughts:  No   Homicidal Thoughts:  No  Memory:  Immediate;   Fair Recent;   Fair Remote;   Fair  Judgement:  Fair  Insight:  Present  Psychomotor Activity:  Restlessness  Concentration:  Fair  Recall:  Fair  Akathisia:  No  Handed:  Right  AIMS (if indicated):     Assets:  Desire for Improvement  Sleep:  Number of Hours: 6    Current Medications: Current Facility-Administered Medications  Medication Dose Route Frequency Provider Last Rate Last Dose  . acetaminophen (TYLENOL) tablet 650 mg  650 mg Oral Q6H PRN Rachael Fee, MD      . alum & mag hydroxide-simeth (MAALOX/MYLANTA) 200-200-20 MG/5ML suspension 30 mL  30 mL Oral Q4H PRN Rachael Fee, MD      . desvenlafaxine (PRISTIQ) 24 hr tablet 50 mg  50 mg Oral Daily Rachael Fee, MD   50 mg at 12/04/12 0800  . divalproex (DEPAKOTE) DR tablet 250 mg  250 mg Oral BID Rachael Fee, MD   250 mg at 12/04/12 0810  . hydrOXYzine (ATARAX/VISTARIL) tablet 25 mg  25 mg Oral Q6H PRN Rachael Fee, MD   25 mg at 12/03/12 2217  . magnesium hydroxide (MILK OF MAGNESIA) suspension 30 mL  30 mL Oral Daily PRN Rachael Fee, MD      . mirtazapine (REMERON) tablet 30 mg  30 mg Oral QHS Rachael Fee, MD   30 mg at 12/03/12 2216  . multivitamin with minerals tablet 1 tablet  1 tablet Oral Daily Rachael Fee,  MD   1 tablet at 12/04/12 0810  . nicotine (NICODERM CQ - dosed in mg/24 hours) patch 14 mg  14 mg Transdermal Q0600 Rachael Fee, MD   14 mg at 12/04/12 1610  . ondansetron (ZOFRAN) tablet 4 mg  4 mg Oral Q8H PRN Rachael Fee, MD   4 mg at 12/04/12 1250  . thiamine (B-1) injection 100 mg  100 mg Intramuscular Once Rachael Fee, MD      . thiamine (VITAMIN B-1) tablet 100 mg  100 mg Oral Daily Rachael Fee, MD   100 mg at 12/04/12 0810  . zolpidem (AMBIEN) tablet 10 mg  10 mg Oral QHS PRN Rachael Fee, MD   10 mg at 12/02/12 2138    Lab Results: No results found for this or any previous visit (from the past 48 hour(s)).  Physical Findings: AIMS:  Facial and Oral Movements Muscles of Facial Expression: None, normal Lips and Perioral Area: None, normal Jaw: None, normal Tongue: None, normal,Extremity Movements Upper (arms, wrists, hands, fingers): None, normal Lower (legs, knees, ankles, toes): None, normal, Trunk Movements Neck, shoulders, hips: None, normal, Overall Severity Severity of abnormal movements (highest score from questions above): None, normal Incapacitation due to abnormal movements: None, normal Patient's awareness of abnormal movements (rate only patient's report): No Awareness, Dental Status Current problems with teeth and/or dentures?: No Does patient usually wear dentures?: No  CIWA:  CIWA-Ar Total: 2  COWS:  COWS Total Score: 7   Treatment Plan Summary: Daily contact with patient to assess and evaluate symptoms and progress in treatment Medication management  Plan: Supportive approach/coping skills/relapse prevention           Optimize treatment with Depakote, Pristiq  Medical Decision Making Problem Points:  Review of psycho-social stressors (1) Data Points:  Review of medication regiment & side effects (2)  I certify that inpatient services furnished can reasonably be expected to improve the patient's condition.   Alverda Nazzaro A 12/04/2012, 3:21 PM

## 2012-12-04 NOTE — Progress Notes (Signed)
Patient did attend the evening karaoke group.  

## 2012-12-04 NOTE — Progress Notes (Signed)
Pt reports he doesn't feel any better today.  He says he is not sleeping well at night, but day shift says he sleeps a lot during the day.  He still appears irritable and generally stays to himself.  He says he is not having any other withdrawal symptoms.  He is unsure what he will do at discharge, but wants long term treatment.  Pt contracts for safety on the unit.  Support/encouragement given.  Safety maintained with q15 minutes.

## 2012-12-04 NOTE — Progress Notes (Signed)
D - Patient up and active on the unit this afternoon, mood depressed and affect flat. Patient attended group karaoke tonight with minimal participation. Patient playing cards with peers, interacting appropriately. Patient denies any nausea this afternoon. Patient positive for passive SI, denies HI and auditory and visual hallucinations. Patient calm and cooperative with staff.  A - Patient offered encouragement and support through therapeutic conversation. Encouraged patient to speak with staff about any concerns or questions. Medications given as ordered.  R - Patient safety maintained with Q 15 minute checks. Patient remains safe on the unit.

## 2012-12-04 NOTE — Care Management Utilization Note (Signed)
   Per State Regulation 482.30  This chart was reviewed for necessity with respect to the patient's Admission/ Duration of stay.  Next review date: 12/08/12  Nicolasa Ducking RN, BSN

## 2012-12-04 NOTE — Progress Notes (Signed)
D: Patient denies SI/HI and auditory and hallucinations. The patient has an irritable mood and affect. The patient reports sleeping fairly well and when questioned about his appetite and energy level the patient responds that "he is here." The patient is not attending group and is isolating to his room. RN attempted to encourage patient to get up and attend group but patient responded that he "didn't feel like it." After this communication patient only gives minimal responses to RN.  A: Patient given emotional support from RN. Patient encouraged to come to staff with concerns and/or questions. Patient's medication routine continued. Patient's orders and plan of care reviewed.  R: Patient remains somewhat cooperative. Will continue to monitor patient q15 minutes for safety.

## 2012-12-04 NOTE — Progress Notes (Addendum)
[facility] LCSW Aftercare Discharge Planning Group Note  12/04/2012   Participation Quality: Did Not Attend    Curtis Blankenship 12/04/2012,  

## 2012-12-05 MED ORDER — DIVALPROEX SODIUM ER 250 MG PO TB24
250.0000 mg | ORAL_TABLET | Freq: Three times a day (TID) | ORAL | Status: DC
  Administered 2012-12-05 – 2012-12-08 (×9): 250 mg via ORAL
  Filled 2012-12-05 (×13): qty 1

## 2012-12-05 MED ORDER — DISULFIRAM 250 MG PO TABS
250.0000 mg | ORAL_TABLET | Freq: Every day | ORAL | Status: DC
  Administered 2012-12-05 – 2012-12-08 (×4): 250 mg via ORAL
  Filled 2012-12-05 (×6): qty 1

## 2012-12-05 MED ORDER — DIVALPROEX SODIUM 250 MG PO DR TAB
250.0000 mg | DELAYED_RELEASE_TABLET | Freq: Three times a day (TID) | ORAL | Status: DC
  Filled 2012-12-05 (×2): qty 1

## 2012-12-05 NOTE — Clinical Social Work Note (Signed)
[facility] LCSW Group Therapy  12/05/2012 1:15 PM  Type of Therapy:  Group Therapy  Participation Level:  Did Not Attend    Harrill, Catherine Campbell 12/05/2012,   

## 2012-12-05 NOTE — Progress Notes (Signed)
Adult Psychoeducational Group Note  Date:  12/05/2012 Time:  11:07 AM  Group Topic/Focus:  Therapeutic activity was human bingo. People went around the room and got to know each other. After the activity pts spent time discussing the value of  the activity and how the interaction went for them. Group also focused on rehab prevention. participation Level:  Minimal  Participation Quality:  Attentive  Affect:  Appropriate and Flat  Cognitive:  Oriented  Insight: Good  Engagement in Group:  Lacking  Modes of Intervention:  Discussion, Education and Support  Additional Comments:  Pt indicated that he did not want to participate in group but staying in the group room. Pt engaged in side conversation and need redirecting during group. PT did say he prefers to be called "Curtis Blankenship" rather than his legal name. Pt goal is to prepare for discharge.  Merrilyn Legler T 12/05/2012, 11:07 AM

## 2012-12-05 NOTE — Progress Notes (Signed)
Patient approached RN in hallway asking if he could have a new room assignment. When RN inquired why, pt responded that his roommate "snores too loud." RN went to room and listened. Patient's roommate was very faintly snoring. RN told patient that requests to change rooms have to go through charge and they have to have a legitimate reason. RN observed patient interacting with another patient and stating that "we will be roommates." RN informed charge and discussed the issue. Room change request was denied.

## 2012-12-05 NOTE — Progress Notes (Signed)
Adult Psychoeducational Group Note  Date:  12/05/2012 Time:  3:28 PM  Group Topic/Focus:  Relapse Prevention Planning:   The focus of this group is to define relapse and discuss the need for planning to combat relapse.  Participation Level:  Minimal  Participation Quality:  Appropriate and Attentive  Affect:  Appropriate and Flat  Cognitive:  Alert  Insight: Appropriate  Engagement in Group:  Lacking and Resistant  Modes of Intervention:  Discussion  Additional Comments:  Pt was resistant to participate in discussion. As this Clinical research associate began group, pt continued to interrupt this Clinical research associate, asking questions about groups and stating that we just hand out piece of paper for pts to read. Pt needed to be redirected several times in the beginning of group.   Dalia Heading 12/05/2012, 3:28 PM

## 2012-12-05 NOTE — Clinical Social Work Note (Addendum)
[facility] LCSW Aftercare Discharge Planning Group Note  12/05/2012   Participation Quality:  Inappropriate  Affect: Irritable  Cognitive:  Alert and oriented  Insight:  Lacking  Engagement in Group:  Engaged when it is about his situation  Modes of Intervention:  Limit setting, Rapport Building and Support  Summary of Progress/Problems:  Patient given news of date at [facility] for 2/18 as pt had reported he had supports should there be a delay in admit after discharge from here.  Patient reports the date will not work as he has concert to go to in [place] that he already has tickets for.  Patient also given opportunity to go to [facility] today if bed opens up (which was expected and later did happen).  Patient turned that down also if he could not go home for a few days to get things like driver's license in order.    Curtis Blankenship 12/05/2012,

## 2012-12-05 NOTE — Progress Notes (Signed)
D.  Pt was positive for evening AA group, denies SI/HI/hallucinations at this time.  Pt has been irritable during the day, but tonight was amiable and playing cards with peers.  A big problem for Pt anxiety wise is that his roommate snores and he states that this has kept him awake.  Hopeful that this will not be the case tonight.  A.  Support and encouragement offered  R.  Will continue to monitor.

## 2012-12-05 NOTE — Progress Notes (Signed)
D: Patient denies SI/HI and auditory and visual hallucinations. Patient has an irritable mood and affect. Patient refuses to complete his self-inventory sheets. Patient told RN, in hallway, that he had "completed them for three days but that no one picks them up." RN educated patient that he needed to hand them to staff each day. Patient became irate and started yelling at RN. RN asked patient not to yell and walked away. Early in the day, patient is observed joking and laughing with others during group but seems to have no meaningful participation. Patient also told RN that he wants a long-term program but that he has to "attend a concert in [place] next month." Patient states that he wants treatment but wants to "have fun too."   A: Patient given emotional support from RN. Patient encouraged to come to staff with concerns and/or questions. Patient's medication routine continued. Patient's orders and plan of care reviewed. Patient remains irritable.  R: Patient remains appropriate and cooperative. Will continue to monitor patient q15 minutes for safety.

## 2012-12-05 NOTE — Progress Notes (Signed)
[facility] MD Progress Note  12/05/2012 1:35 PM Salik Grewell  MRN:  [id number] Subjective:  Curtis Blankenship is still having a hard time. He admits to anxieity. Admits to cravings. He is trying to put a plan together for when he leaves the in patient unit. Concerned about relapsing as he feels he is not going to be able to deal with another relapse. He has a bed available at [facility] the 18. His family will be willing to house him while waiting for the bed to come open. We discussed the use of Antabuse as well as Vivitrol. He is willing to pursue those strategies. His mood is still unstable. He has been observed to be irritable and "going off easily." Will increase the Depakote and start Antabuse Diagnosis:  PTSD, Mood Disorder NOS, Alcohol Dependence, Depressive Disorder NOS  ADL's:  Intact  Sleep: Poor  Appetite:  Fair  Suicidal Ideation:  Plan:  denies Intent:  denies Means:  denies Homicidal Ideation:  Plan:  denies Intent:  denies Means:  denies AEB (as evidenced by):  Psychiatric Specialty Exam: Review of Systems  Constitutional: Negative.   HENT: Negative.   Eyes: Negative.   Respiratory: Negative.   Cardiovascular: Negative.   Gastrointestinal: Negative.   Genitourinary: Negative.   Musculoskeletal: Negative.   Skin: Negative.   Neurological: Negative.   Endo/Heme/Allergies: Negative.   Psychiatric/Behavioral: Positive for depression and substance abuse. The patient is nervous/anxious.        Alcohol cravings    Blood pressure 115/82, pulse 74, temperature 97.7 F (36.5 C), temperature source Oral, resp. rate 18, height 5\' 10"  (1.778 m), weight 79.379 kg (175 lb).Body mass index is 25.11 kg/(m^2).  General Appearance: Fairly Groomed  Patent attorney::  Fair  Speech:  Clear and Coherent and not spontaneous  Volume:  Normal  Mood:  Anxious and Irritable  Affect:  Labile  Thought Process:  Coherent and Goal Directed  Orientation:  Full (Time, Place, and Person)  Thought Content:   worries and concerns, fear of losing control, of relapsing  Suicidal Thoughts:  No  Homicidal Thoughts:  No  Memory:  Immediate;   Fair Recent;   Fair Remote;   Fair  Judgement:  Fair  Insight:  superficial  Psychomotor Activity:  Restlessness  Concentration:  Fair  Recall:  Fair  Akathisia:  No  Handed:  Right  AIMS (if indicated):     Assets:  Desire for Improvement  Sleep:  Number of Hours: 4.5    Current Medications: Current Facility-Administered Medications  Medication Dose Route Frequency Provider Last Rate Last Dose  . acetaminophen (TYLENOL) tablet 650 mg  650 mg Oral Q6H PRN Rachael Fee, MD      . alum & mag hydroxide-simeth (MAALOX/MYLANTA) 200-200-20 MG/5ML suspension 30 mL  30 mL Oral Q4H PRN Rachael Fee, MD      . desvenlafaxine (PRISTIQ) 24 hr tablet 50 mg  50 mg Oral Daily Rachael Fee, MD   50 mg at 12/05/12 0800  . divalproex (DEPAKOTE) DR tablet 250 mg  250 mg Oral BID Rachael Fee, MD   250 mg at 12/05/12 4098  . hydrOXYzine (ATARAX/VISTARIL) tablet 25 mg  25 mg Oral Q6H PRN Rachael Fee, MD   25 mg at 12/04/12 2244  . magnesium hydroxide (MILK OF MAGNESIA) suspension 30 mL  30 mL Oral Daily PRN Rachael Fee, MD      . mirtazapine (REMERON) tablet 30 mg  30 mg Oral QHS Rachael Fee,  MD   30 mg at 12/04/12 2230  . multivitamin with minerals tablet 1 tablet  1 tablet Oral Daily Rachael Fee, MD   1 tablet at 12/05/12 [phone]  . nicotine (NICODERM CQ - dosed in mg/24 hours) patch 14 mg  14 mg Transdermal Q0600 Rachael Fee, MD   14 mg at 12/05/12 0754  . ondansetron (ZOFRAN) tablet 4 mg  4 mg Oral Q8H PRN Rachael Fee, MD   4 mg at 12/04/12 1250  . thiamine (B-1) injection 100 mg  100 mg Intramuscular Once Rachael Fee, MD      . thiamine (VITAMIN B-1) tablet 100 mg  100 mg Oral Daily Rachael Fee, MD   100 mg at 12/05/12 9604  . zolpidem (AMBIEN) tablet 10 mg  10 mg Oral QHS PRN Rachael Fee, MD   10 mg at 12/04/12 2244    Lab Results: No results found  for this or any previous visit (from the past 48 hour(s)).  Physical Findings: AIMS: Facial and Oral Movements Muscles of Facial Expression: None, normal Lips and Perioral Area: None, normal Jaw: None, normal Tongue: None, normal,Extremity Movements Upper (arms, wrists, hands, fingers): None, normal Lower (legs, knees, ankles, toes): None, normal, Trunk Movements Neck, shoulders, hips: None, normal, Overall Severity Severity of abnormal movements (highest score from questions above): None, normal Incapacitation due to abnormal movements: None, normal Patient's awareness of abnormal movements (rate only patient's report): No Awareness, Dental Status Current problems with teeth and/or dentures?: No Does patient usually wear dentures?: No  CIWA:  CIWA-Ar Total: 0  COWS:  COWS Total Score: 7   Treatment Plan Summary: Daily contact with patient to assess and evaluate symptoms and progress in treatment Medication management  Plan: Supportive approach/coping skills/relapse prevention/anger management            Antabuse 250 mg daily            Increase the Depakote ER 250 to TID             Medical Decision Making Problem Points:  Review of last therapy session (1) and Review of psycho-social stressors (1) Data Points:  Review of medication regiment & side effects (2) Review of new medications or change in dosage (2)  I certify that inpatient services furnished can reasonably be expected to improve the patient's condition.   Stryder Poitra A 12/05/2012, 1:35 PM

## 2012-12-06 NOTE — Clinical Social Work Note (Signed)
[facility] Group Notes:  (Clinical Social Work)  12/06/2012  10-11AM  Summary of Progress/Problems:   The main focus of today's process group was for the patient to identify ways in which they have in the past sabotaged their own recovery and reasons they may have done this/what they received from doing it.  Motivational interviewing techniques were utilized to explore motivations and plans to avoid self-sabotage when discharged from the hospital for this admission.  The patient expressed that his motivation is at 8 out of 10, because he is not sure if he wants to do the work that will be necessary to become sober.  He stated that he thinks he needs to go through a 2-year program, but that he is not really willing to give up 2 years of his life for it.  He went through a considerable list of places he has already had extensive treatment and/or rehabilitation, and said he has been using this heavily for about 6 years.  He stated he has burned all the bridges to relationships in his life.  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  The patient referred throughout the group to how "f---ing depressed" he is, and his affect was blunted but he did not overtly show some of the more typical signs of depression.  He talked multiple times about relying heavily on the Antabuse that he is currently prescribed, and hopes that the fear of becoming sick will be a significant deterrent to drinking.  However, he mostly talked about wanting to go through ECT and how that seems to him to be the "re-set button" that he needs to hit.  He was dismayed to hear that it is not a one-time 20-minute treatment.  CSW informed Dr. Dub Mikes of patient's thoughts regarding ECT.  Type of Therapy:  Group Therapy - Process  Participation Level:  Active  Participation Quality:  Attentive and Sharing  Affect:  Blunted  Cognitive:  Oriented  Insight:   Developing/Improving  Engagement in Therapy:  Engaged  Modes of Intervention:  Clarification, Education, Limit-setting, Problem-solving, Socialization, Support and Processing, Exploration, Discussion   Ambrose Mantle, LCSW 12/06/2012, 12:46 PM

## 2012-12-06 NOTE — Progress Notes (Signed)
Patient ID: Curtis Blankenship, male   DOB: 1981/03/07, 32 y.o.   MRN: [id number] D: "I am OK." A: Pt. Denies lethality and A/V/H's or other problems. Pt. States he knows where he is going to seek rehab after discharge and is adamant that he will attend "a massive concert with about 3 stages" in [place] at the end of the month: "I spent $1000.00 for the tickets and I don't even have someone to go with me!" --this last spoken to peers nearby.  Pt. remains easily irritable and is defensive about his plans.  Took Vistaril for HS and played cards until bedtime. R: Will continue to monitor for changes.

## 2012-12-06 NOTE — Progress Notes (Signed)
[facility] MD Progress Note  12/06/2012 1:29 PM Curtis Blankenship  MRN:  [id number] Subjective:  Curtis Blankenship still endorses mood instability. He is sleeping better. He is concerned about the alcohol cravings, and the possibility of relapsing. He welcomed the use of Antabuse as feels that this is going to deter his drinking. He would like to consider other treatment options for his mood, depression. Was wanting to find out more about ECT. He still thinks he is going to be allowed to stay with his mother or sister while waiting for the bed at [facility]. Diagnosis:  Alcohol Dependence, Cocaine Abuse, PTSD, Depressive disorder NOS  ADL's:  Intact  Sleep: Fair  Appetite:  Fair  Suicidal Ideation:  Plan:  Denies Intent:  denies Means:  denies Homicidal Ideation:  Plan:  denies Intent:  denies Means:  denies AEB (as evidenced by):  Psychiatric Specialty Exam: Review of Systems  Constitutional: Negative.   HENT: Negative.   Eyes: Negative.   Respiratory: Negative.   Cardiovascular: Negative.   Gastrointestinal: Negative.   Genitourinary: Negative.   Musculoskeletal: Negative.   Skin: Negative.   Neurological: Negative.   Endo/Heme/Allergies: Negative.   Psychiatric/Behavioral: Positive for depression and substance abuse. The patient is nervous/anxious.        Mood instability    Blood pressure 130/85, pulse 86, temperature 98.2 F (36.8 C), temperature source Oral, resp. rate 20, height 5\' 10"  (1.778 m), weight 79.379 kg (175 lb).Body mass index is 25.11 kg/(m^2).  General Appearance: Fairly Groomed  Patent attorney::  Fair  Speech:  Clear and Coherent  Volume:  Normal  Mood:  Anxious, Irritable and worried  Affect:  Restricted  Thought Process:  Coherent and Goal Directed  Orientation:  Full (Time, Place, and Person)  Thought Content:  Rumination  Suicidal Thoughts:  No  Homicidal Thoughts:  No  Memory:  Immediate;   Fair Recent;   Fair Remote;   Fair  Judgement:  Fair  Insight:  Present   Psychomotor Activity:  Restlessness  Concentration:  Fair  Recall:  Fair  Akathisia:  No  Handed:  Right  AIMS (if indicated):     Assets:  Desire for Improvement  Sleep:  Number of Hours: 6.5   Current Medications: Current Facility-Administered Medications  Medication Dose Route Frequency Provider Last Rate Last Dose  . acetaminophen (TYLENOL) tablet 650 mg  650 mg Oral Q6H PRN Rachael Fee, MD      . alum & mag hydroxide-simeth (MAALOX/MYLANTA) 200-200-20 MG/5ML suspension 30 mL  30 mL Oral Q4H PRN Rachael Fee, MD      . desvenlafaxine (PRISTIQ) 24 hr tablet 50 mg  50 mg Oral Daily Rachael Fee, MD   50 mg at 12/06/12 7846  . disulfiram (ANTABUSE) tablet 250 mg  250 mg Oral Daily Rachael Fee, MD   250 mg at 12/06/12 9629  . divalproex (DEPAKOTE ER) 24 hr tablet 250 mg  250 mg Oral TID Rachael Fee, MD   250 mg at 12/06/12 1213  . hydrOXYzine (ATARAX/VISTARIL) tablet 25 mg  25 mg Oral Q6H PRN Rachael Fee, MD   25 mg at 12/06/12 0916  . magnesium hydroxide (MILK OF MAGNESIA) suspension 30 mL  30 mL Oral Daily PRN Rachael Fee, MD      . mirtazapine (REMERON) tablet 30 mg  30 mg Oral QHS Rachael Fee, MD   30 mg at 12/05/12 2234  . multivitamin with minerals tablet 1 tablet  1 tablet Oral  Daily Rachael Fee, MD   1 tablet at 12/06/12 [phone]  . nicotine (NICODERM CQ - dosed in mg/24 hours) patch 14 mg  14 mg Transdermal Q0600 Rachael Fee, MD   14 mg at 12/06/12 2956  . ondansetron (ZOFRAN) tablet 4 mg  4 mg Oral Q8H PRN Rachael Fee, MD   4 mg at 12/04/12 1250  . thiamine (B-1) injection 100 mg  100 mg Intramuscular Once Rachael Fee, MD      . thiamine (VITAMIN B-1) tablet 100 mg  100 mg Oral Daily Rachael Fee, MD   100 mg at 12/06/12 2130  . zolpidem (AMBIEN) tablet 10 mg  10 mg Oral QHS PRN Rachael Fee, MD   10 mg at 12/05/12 2234    Lab Results: No results found for this or any previous visit (from the past 48 hour(s)).  Physical Findings: AIMS: Facial and Oral  Movements Muscles of Facial Expression: None, normal Lips and Perioral Area: None, normal Jaw: None, normal Tongue: None, normal,Extremity Movements Upper (arms, wrists, hands, fingers): None, normal Lower (legs, knees, ankles, toes): None, normal, Trunk Movements Neck, shoulders, hips: None, normal, Overall Severity Severity of abnormal movements (highest score from questions above): None, normal Incapacitation due to abnormal movements: None, normal Patient's awareness of abnormal movements (rate only patient's report): No Awareness, Dental Status Current problems with teeth and/or dentures?: No Does patient usually wear dentures?: No  CIWA:  CIWA-Ar Total: 2 COWS:  COWS Total Score: 7  Treatment Plan Summary: Daily contact with patient to assess and evaluate symptoms and progress in treatment Medication management  Plan:  Supportive approach/coping skills/relapse prevention            Continue Depakote ER 250 mg TID            Get Depakote level            Discuss ECT vs. TMS  Medical Decision Making Problem Points:  Review of last therapy session (1) and Review of psycho-social stressors (1) Data Points:  Review of medication regiment & side effects (2)  I certify that inpatient services furnished can reasonably be expected to improve the patient's condition.   [name] A 12/06/2012, 1:29 PM

## 2012-12-06 NOTE — Progress Notes (Signed)
Adult Psychoeducational Group Note  Date:  12/06/2012 Time:  1315  Group Topic/Focus:  Coping With Mental Health Crisis:   The purpose of this group is to help patients identify strategies for coping with mental health crisis.  Group discusses possible causes of crisis and ways to manage them effectively.  Participation Level:  Did Not Attend  Participation Quality:   Affect:   Cognitive:    Insight:   Engagement in Group:    Modes of Intervention:    Additional Comments:    Cresenciano Lick 12/06/2012, 4:45 PM

## 2012-12-06 NOTE — Progress Notes (Signed)
D:Pt forwards little and has been irritable and cautious with staff. Pt observed playing cards with peers and interacting. Pt did not attend group and is currently sitting in dayroom eating ice cream. A:Offered support and gave medications as ordered. Monitored q 15 minute checks. R:Pt denies si and hi. He rates depression as a 5 on 1-10 scale with 10 being the most depressed. Safety maintained on the unit.

## 2012-12-06 NOTE — Progress Notes (Signed)
D.  Took over Pt's care at 2330.  Pt currently resting in bed with eyes closed, respirations even and unlabored.  No distress noted.  A. Will continue to monitor  R. Pt remains safe

## 2012-12-07 MED ORDER — HYDROXYZINE HCL 50 MG PO TABS
50.0000 mg | ORAL_TABLET | Freq: Four times a day (QID) | ORAL | Status: DC | PRN
  Administered 2012-12-07 (×2): 50 mg via ORAL

## 2012-12-07 NOTE — Progress Notes (Signed)
Patient ID: Curtis Blankenship, male   DOB: 1981-06-15, 33 y.o.   MRN: [id number] 12-07-2012 nursing shift note; D: took this patient at 1100 am. He has a hx of bipolar d/o and polysub abuse/dependence. A; met with the patient he voiced no complaints at this time. He was anxious earlier in the shift. R: he responded well to the vistaril. RN will monitor and Q 15 min ck's

## 2012-12-07 NOTE — Progress Notes (Signed)
D.  Pt pleasant on approach, in dayroom playing cards with peers.  Denies SI/HI/hallucinations at this time.  Interacting appropriately on unit with peers.  Pt did not attend evening group, remained in his room reading.  Pt's plan is to go to IOP and Meagher Cellar, RN made the appropriate arrangements on day shift for Charmian Muff to come up and assess him in AM for this.  Pt is pleased with this arrangement.  A.  Support and encouragement offered  R.  Pt remains safe on unit, will continue to monitor.

## 2012-12-07 NOTE — Clinical Social Work Note (Signed)
[facility] Group Notes: (Clinical Social Work)   12/07/2012      Type of Therapy:  Group Therapy   Participation Level:  Did Not Attend    Ambrose Mantle, LCSW 12/07/2012, 12:13 PM

## 2012-12-07 NOTE — Progress Notes (Signed)
[facility] MD Progress Note  12/07/2012 1:51 PM Curtis Blankenship  MRN:  [id number] Subjective:  Curtis Blankenship is still trying to get himself together. 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 He describes an episode of acute anxiety yesterday, not sure what triggered it. The anxiety triggered his wanting to drink. Can see the pattern. He is aware of how he has Blankenship hard time being at ease around people. It has taken him Blankenship while before he felt comfortable being here. Trust is Blankenship major issue and his need to be hype aware of his surroundings. As he works on long term sobriety he knows he needs to address these issues that can trigger relapse Diagnosis:  PTSD, Depressive Disorder NOS, Alcohol Dependence, Cocaine Abuse  ADL's:  Intact  Sleep: Fair  Appetite:  Fair  Suicidal Ideation:  Plan:  Denies Intent:  Denies Means:  Denies Homicidal Ideation:  Plan:  Denies Intent:  Denies Means:  Denies AEB (as evidenced by):  Psychiatric Specialty Exam: Review of Systems  Constitutional: Negative.   HENT: Negative.   Eyes: Negative.   Respiratory: Negative.   Cardiovascular: Negative.   Gastrointestinal: Negative.   Genitourinary: Negative.   Musculoskeletal: Negative.   Skin: Negative.   Neurological: Negative.   Endo/Heme/Allergies: Negative.   Psychiatric/Behavioral: Positive for substance abuse. The patient is nervous/anxious.     Blood pressure 132/91, pulse 81, temperature 98.4 F (36.9 C), temperature source Oral, resp. rate 18, height 5\' 10"  (1.778 m), weight 79.379 kg (175 lb).Body mass index is 25.11 kg/(m^2).  General Appearance: Fairly Groomed  Patent attorney::  Fair  Speech:  Clear and Coherent and not spontaneous  Volume:  Decreased  Mood:  Anxious and worried  Affect:  Restricted  Thought Process:  Coherent and Goal Directed  Orientation:  Full (Time, Place, and Person)  Thought Content:  worries, concerns, fear of losing control, of relapsing  Suicidal  Thoughts:  No  Homicidal Thoughts:  No  Memory:  Immediate;   Fair Recent;   Fair Remote;   Fair  Judgement:  Fair  Insight:  Present  Psychomotor Activity:  Normal  Concentration:  Fair  Recall:  Fair  Akathisia:  No  Handed:  Right  AIMS (if indicated):     Assets:  Desire for Improvement  Sleep:  Number of Hours: 4.25   Current Medications: Current Facility-Administered Medications  Medication Dose Route Frequency Provider Last Rate Last Dose  . acetaminophen (TYLENOL) tablet 650 mg  650 mg Oral Q6H PRN Rachael Fee, MD      . alum & mag hydroxide-simeth (MAALOX/MYLANTA) 200-200-20 MG/5ML suspension 30 mL  30 mL Oral Q4H PRN Rachael Fee, MD      . desvenlafaxine (PRISTIQ) 24 hr tablet 50 mg  50 mg Oral Daily Rachael Fee, MD   50 mg at 12/07/12 0856  . disulfiram (ANTABUSE) tablet 250 mg  250 mg Oral Daily Rachael Fee, MD   250 mg at 12/07/12 0849  . divalproex (DEPAKOTE ER) 24 hr tablet 250 mg  250 mg Oral TID Rachael Fee, MD   250 mg at 12/07/12 1155  . hydrOXYzine (ATARAX/VISTARIL) tablet 50 mg  50 mg Oral Q6H PRN Rachael Fee, MD      . magnesium hydroxide (MILK OF MAGNESIA) suspension 30 mL  30 mL Oral Daily PRN Rachael Fee, MD      . mirtazapine (REMERON) tablet 30 mg  30 mg Oral QHS Rachael Fee,  MD   30 mg at 12/06/12 2214  . multivitamin with minerals tablet 1 tablet  1 tablet Oral Daily Rachael Fee, MD   1 tablet at 12/07/12 0850  . nicotine (NICODERM CQ - dosed in mg/24 hours) patch 14 mg  14 mg Transdermal Q0600 Rachael Fee, MD   14 mg at 12/07/12 4782  . ondansetron (ZOFRAN) tablet 4 mg  4 mg Oral Q8H PRN Rachael Fee, MD   4 mg at 12/04/12 1250  . thiamine (B-1) injection 100 mg  100 mg Intramuscular Once Rachael Fee, MD      . thiamine (VITAMIN B-1) tablet 100 mg  100 mg Oral Daily Rachael Fee, MD   100 mg at 12/07/12 0850  . zolpidem (AMBIEN) tablet 10 mg  10 mg Oral QHS PRN Rachael Fee, MD   10 mg at 12/06/12 2216    Lab Results: No  results found for this or any previous visit (from the past 48 hour(s)).  Physical Findings: AIMS: Facial and Oral Movements Muscles of Facial Expression: None, normal Lips and Perioral Area: None, normal Jaw: None, normal Tongue: None, normal,Extremity Movements Upper (arms, wrists, hands, fingers): None, normal Lower (legs, knees, ankles, toes): None, normal, Trunk Movements Neck, shoulders, hips: None, normal, Overall Severity Severity of abnormal movements (highest score from questions above): None, normal Incapacitation due to abnormal movements: None, normal Patient's awareness of abnormal movements (rate only patient's report): No Awareness, Dental Status Current problems with teeth and/or dentures?: No Does patient usually wear dentures?: No  CIWA:  CIWA-Ar Total: 4 COWS:  COWS Total Score: 3  Treatment Plan Summary: Daily contact with patient to assess and evaluate symptoms and progress in treatment Medication management  Plan: Supportive approach/coping skills/relapse prevention/CBT           Increase the Vistaril to 50 mg  Medical Decision Making Problem Points:  Review of last therapy session (1) and Review of psycho-social stressors (1) Data Points:  Review of medication regiment & side effects (2)  I certify that inpatient services furnished can reasonably be expected to improve the patient's condition.   Curtis Blankenship 12/07/2012, 1:51 PM

## 2012-12-07 NOTE — Progress Notes (Signed)
.  [facility] Group Notes:  (Nursing/MHT/Case Management/Adjunct)  Date:  12/06/2012   Time:  2100 Type of Therapy:  wrap up group  Participation Level:  Active  Participation Quality:  Appropriate, Attentive, Sharing and Supportive  Affect:  Appropriate  Cognitive:  Appropriate  Insight:  Appropriate  Engagement in Group:  Engaged  Modes of Intervention:  Clarification, Discussion, Education and Support  Summary of Progress/Problems: Pt was felt positive about his treatment options that he spoke with the doctor about.  Pt is very optimistic about a new form of treatment that is available for PTSD that should be available to him soon.  Pt shares the importance of getting a sponsor even if it is a temporary sponsor as soon as you are discharged.  Pt reported one of his struggles is patience, pt wants sobriety and he wants it now, but know it takes work.  Curtis Blankenship 12/07/2012, 2:26 AM

## 2012-12-07 NOTE — Progress Notes (Signed)
D:  Patient's self inventory sheet, patient has fair sleep, good appetite, normal energy level, improving attention span.  Rated depression and hopelessness #5.  Has experienced cravings, agitation.  Denied SI.   Has experienced headache in past 24 hours.  Zero pain goal, worst pain #1.   After discharge, plans to go to meetings.  No questions for staff.  Does have discharge plans.  No problems taking medications after discharge. A:  Medications administered per MD order.  Staff will continue to monitor with 15 minutes checks for safety.  Emotional support and encouragement given patient.   R:  Patient denied SI and HI.   Denied A/V hallucinations.  Denied pain.  Patient remains safe on unit and will continue to monitor. Patient upset this morning; patients at medication window.  Patient given his medications.  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  That he drinks too much, has PTSD, that his mind is always "over there and not here", that he wants to take antabuse to keep him from drinking alcohol.  Encouraged patient to return to school, and make friends that will help him not encourage him to drink.  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   Does not want to drink alcohol and return to the hospital.

## 2012-12-08 MED ORDER — DESVENLAFAXINE SUCCINATE ER 50 MG PO TB24: 50.0000 mg | ORAL_TABLET | Freq: Every day | ORAL | Status: DC

## 2012-12-08 MED ORDER — ZOLPIDEM TARTRATE 10 MG PO TABS: 10.0000 mg | ORAL_TABLET | Freq: Every evening | ORAL | Status: DC | PRN

## 2012-12-08 MED ORDER — ASPIRIN 81 MG PO CHEW: 81.0000 mg | CHEWABLE_TABLET | Freq: Every day | ORAL | Status: DC

## 2012-12-08 MED ORDER — DIVALPROEX SODIUM ER 250 MG PO TB24: 250.0000 mg | ORAL_TABLET | Freq: Three times a day (TID) | ORAL | Status: DC

## 2012-12-08 MED ORDER — DISULFIRAM 250 MG PO TABS: 250.0000 mg | ORAL_TABLET | Freq: Every day | ORAL | Status: DC

## 2012-12-08 MED ORDER — MIRTAZAPINE 30 MG PO TABS: 30.0000 mg | ORAL_TABLET | Freq: Every day | ORAL | Status: DC

## 2012-12-08 NOTE — Progress Notes (Signed)
[facility] Adult Case Management Discharge Plan :  Will you be returning to the same living situation after discharge: No. Patient will be discharging to his sister's home. At discharge, do you have transportation home?:Yes,  sister Do you have the ability to pay for your medications:Yes,  insurance and help from family  Release of information consent forms completed and in the chart;  Patient's signature needed at discharge.  Patient to Follow up at: Follow-up Information   Follow up with [facility]  On 12/16/2012. (Go to [facility] on  Tuesday morning 2/18 at 8AM ready for admit; take your ID, 30 days med supply and needed clothing for treatment,)    Contact information:   [address] [place] [address] Ph [phone] Fax [phone]      Patient denies SI/HI:   Yes,  denies both SI and HI    Safety Planning and Suicide Prevention discussed:  Yes,  with patient  [name] 12/08/2012, 12:35 PM

## 2012-12-08 NOTE — Progress Notes (Signed)
[facility] LCSW Aftercare Discharge Planning Group Note  12/08/2012 8:45AM  Participation Quality:  Did Not Attend   [name] 12/08/2012,

## 2012-12-08 NOTE — Progress Notes (Signed)
Psychoeducational Group Note  Date:  12/07/2012  Time: 2100  Group Topic/Focus:  wrap up group  Participation Level: Did Not Attend  Participation Quality:  Not Applicable  Affect:  Not Applicable  Cognitive:  Not Applicable  Insight:  Not Applicable  Engagement in Group: Not Applicable  Additional Comments:  Pt was made aware that group was starting but pt remained in bed reading.  Shelah Lewandowsky 12/08/2012, 2:12 AM

## 2012-12-08 NOTE — Care Management Utilization Note (Signed)
Per State Regulation 482.30  This chart was reviewed for necessity with respect to the patient's Admission/ Duration of stay.  Next review date: 12/11/12

## 2012-12-08 NOTE — Discharge Summary (Signed)
Physician Discharge Summary Note  Patient:  Curtis Blankenship is an 32 y.o., male MRN:  [id number] DOB:  22-Sep-1981 Patient phone:  [phone] (home)  Patient address:   [address] [place] [address],   Date of Admission:  12/01/2012  Date of Discharge: 12/08/12  Reason for Admission:  Alcohol and cocaine abuse  Discharge Diagnoses: Active Problems:   Alcohol dependence   Cocaine abuse   PTSD (post-traumatic stress disorder)   Depressive disorder  Review of Systems  Constitutional: Negative.   HENT: Negative.   Eyes: Negative.   Respiratory: Positive for hemoptysis.   Cardiovascular: Negative.   Gastrointestinal: Negative.   Genitourinary: Negative.   Musculoskeletal: Negative.   Skin: Negative.   Neurological: Negative.   Endo/Heme/Allergies: Negative.   Psychiatric/Behavioral: Positive for depression (Stabilized with medication prior to discharge) and substance abuse (Hx of). Negative for suicidal ideas, hallucinations and memory loss. The patient is nervous/anxious (Stabilized with medication prior to discharge) and has insomnia (Stabilized with medication prior to discharge).    Axis Diagnosis:   AXIS I:  Post Traumatic Stress Disorder and Alcohol dependence, Cocaine abuse AXIS II:  Deferred AXIS III:   Past Medical History  Diagnosis Date  . Bipolar disorder   . PTSD (post-traumatic stress disorder)    AXIS IV:  other psychosocial or environmental problems and Substance absue issues AXIS V:  64  Level of Care:  [facility]  Hospital Course:  Suicidal attempt two years. Stop taking his pills recently. Alcohol and cocaine. Wants to detox. Has been drinking everyday an undetermined amount of alcohol for the last year. Admitted that he also binges on cocaine. He endorses suicidal ruminations. He is diagnosed with PTSD, and is 100 % service connected. He states that they had him loaded with medications and that he could not function. He went off all of them.  After  admission assessment and evaluation, it was determined that Mr. Hersch will need detoxification treatment to stabilize his system of alcohol intoxication and to combat the withdrawal symptoms of this substance. And his discharge plans included a referral to a long term treatment facility for more intense substance abuse treatment. Mr. Simien was then started on Librium protocol for his alcohol detoxification treatment. He was also enrolled in group counseling sessions and activities to learn coping skills that should help him after discharge to cope better, manage his substance abuse problems to maintain a much longer sobriety. He also was enrolled and attended AA/NA meetings being offered and held on this unit. He has some previous and or identifiable medical conditions that required treatment and or monitoring. He received medication management for all those health issues as well. He was monitored closely for any potential problems that may arise as a result of and or during detoxification treatment. Patient tolerated his treatment regimen and detoxification treatment without any significant adverse effects and or reactions.  Patient attended treatment team meeting this am and met with the team. His reason for admission, symptoms, substance abuse issues, response to treatment and discharge plans discussed. Patient endorsed that he is doing well and stable for discharge to pursue the next phase of his substance abuse treatment.  He was encouraged to join/attend AA/NA meetings being offered and held within his community. He is instructed and encouraged to get a trusted sponsor from the advise of others or from whomever within the AA/NA meetings seems to make sense, and who has a proven track record, and will hold him responsible for his sobriety, and both  expects and insists on his total  abstinence from alcohol. He must focus the first of each month on the speaker meetings where he will specifically look at  how his life has been wrecked by drugs/alcohol and how his life has been similar to that of the speaker's life.   It was then agreed upon between patient and the team that he will be discharged to his home today, and will go to the [facility] on 12/16/12 for further substance abuse treatment. Upon discharge, patient adamantly denies suicidal, homicidal ideations, auditory, visual hallucinations, delusional thinking and or withdrawal symptoms. Patient left [facility] with all personal belongings in no apparent distress. He received 2 weeks worth samples of his discharge medications. Transportation per family.   Consults:  None  Significant Diagnostic Studies:  labs: CBC with diff, CMP, UDS, Toxicology tests,  Discharge Vitals:   Blood pressure 133/93, pulse 92, temperature 98.4 F (36.9 C), temperature source Oral, resp. rate 18, height 5\' 10"  (1.778 m), weight 79.379 kg (175 lb). Body mass index is 25.11 kg/(m^2). Lab Results:   No results found for this or any previous visit (from the past 72 hour(s)).  Physical Findings: AIMS: Facial and Oral Movements Muscles of Facial Expression: None, normal Lips and Perioral Area: None, normal Jaw: None, normal Tongue: None, normal,Extremity Movements Upper (arms, wrists, hands, fingers): None, normal Lower (legs, knees, ankles, toes): None, normal, Trunk Movements Neck, shoulders, hips: None, normal, Overall Severity Severity of abnormal movements (highest score from questions above): None, normal Incapacitation due to abnormal movements: None, normal Patient's awareness of abnormal movements (rate only patient's report): No Awareness, Dental Status Current problems with teeth and/or dentures?: No Does patient usually wear dentures?: No  CIWA:  CIWA-Ar Total: 2 COWS:  COWS Total Score: 3  Psychiatric Specialty Exam: See Psychiatric Specialty Exam and Suicide Risk Assessment completed by Attending Physician prior to discharge.  Discharge  destination:  Other:  Home, the [facility] on 12/16/12  Is patient on multiple antipsychotic therapies at discharge:  No   Has Patient had three or more failed trials of antipsychotic monotherapy by history:  No  Recommended Plan for Multiple Antipsychotic Therapies: NA    Medication List    STOP taking these medications       prazosin 2 MG capsule  Commonly known as:  MINIPRESS     venlafaxine XR 75 MG 24 hr capsule  Commonly known as:  EFFEXOR-XR      TAKE these medications     Indication   aspirin 81 MG chewable tablet  Chew 1 tablet (81 mg total) by mouth daily. Blood thinner for heart health.   Indication:  Heart Attack, Blood Clot, Temporary Stroke     desvenlafaxine 50 MG 24 hr tablet  Commonly known as:  PRISTIQ  Take 1 tablet (50 mg total) by mouth daily. For depression   Indication:  Major Depressive Disorder     disulfiram 250 MG tablet  Commonly known as:  ANTABUSE  Take 1 tablet (250 mg total) by mouth daily. For alcohol cravings   Indication:  Excessive Use of Alcohol     divalproex 250 MG 24 hr tablet  Commonly known as:  DEPAKOTE ER  Take 1 tablet (250 mg total) by mouth 3 (three) times daily. For mood stabilization   Indication:  Depressive Phase of Manic-Depression, Manic Phase of Manic-Depression, Rapidly Alternating Manic-Depressive Psychosis     mirtazapine 30 MG tablet  Commonly known as:  REMERON  Take 1 tablet (  30 mg total) by mouth at bedtime. Depression/anxiety/sleep   Indication:  Trouble Sleeping, Major Depressive Disorder     zolpidem 10 MG tablet  Commonly known as:  AMBIEN  Take 1 tablet (10 mg total) by mouth at bedtime as needed. For sleep.   Indication:  Trouble Sleeping       Follow-up Information   Follow up with [facility]  On 12/16/2012. (Go to [facility] on  Tuesday morning 2/18 at 8AM ready for admit; take your ID, 30 days med supply and needed clothing for treatment,)    Contact information:    [address] [place] [address] Ph [phone] Fax [phone]     Follow-up recommendations: Activity:  as tolerated Other:  Keep all scheduled follow-up appointments as recommended.   Comments:  Take all your medications as prescribed by your mental healthcare provider. Report any adverse effects and or reactions from your medicines to your outpatient provider promptly. Patient is instructed and cautioned to not engage in alcohol and or illegal drug use while on prescription medicines. In the event of worsening symptoms, patient is instructed to call the crisis hotline, 911 and or go to the nearest ED for appropriate evaluation and treatment of symptoms. Follow-up with your primary care provider for your other medical issues, concerns and or health care needs.   Total Discharge Time:  Greater than 30 minutes  Signed: Armandina Stammer I 12/09/2012, 9:21 AM

## 2012-12-08 NOTE — Progress Notes (Signed)
[facility] INPATIENT:  Family/Significant Other Suicide Prevention Education  Suicide Prevention Education:  Patient Refusal for Family/Significant Other Suicide Prevention Education: The patient Curtis Blankenship has refused to provide written consent for family/significant other to be provided Family/Significant Other Suicide Prevention Education during admission and/or prior to discharge. CSW made two requests, both denied. Physician notified. Writer provided suicide prevention education directly to patient; conversation included risk factors, warning signs and resources to contact for help. Mobile crisis services explained although patient prefers to contact 800 service through [place] and wears a bracelet with the number on it. [name] 12/08/2012, 12:37 PM

## 2012-12-08 NOTE — [facility] Suicide Risk Assessment (Signed)
Suicide Risk Assessment  Discharge Assessment     Demographic Factors:  Adolescent or young adult and Caucasian  Mental Status Per Nursing Assessment::   On Admission:  NA  Current Mental Status by Physician: in full contact with reality. There are no suicidal ideas, plans or intent. His mood is euthymic. His affect is appropriate. He is committed to abstinence. He will go to [facility] on the 18 for residential treatment   Loss Factors: NA  Historical Factors: NA  Risk Reduction Factors:   Living with another person, especially a relative and Positive social support  Continued Clinical Symptoms:  Depression:   Comorbid alcohol abuse/dependence Alcohol/Substance Abuse/Dependencies PTSD Cognitive Features That Contribute To Risk: None identified  Suicide Risk:  Minimal: No identifiable suicidal ideation.  Patients presenting with no risk factors but with morbid ruminations; may be classified as minimal risk based on the severity of the depressive symptoms  Discharge Diagnoses:   AXIS I:  PTSD, Alcohol Dependence, Cocaine Abuse, Depressive Disorder NOS AXIS II:  Deferred AXIS III:   Past Medical History  Diagnosis Date  . Bipolar disorder   . PTSD (post-traumatic stress disorder)    AXIS IV:  other psychosocial or environmental problems AXIS V:  61-70 mild symptoms  Plan Of Care/Follow-up recommendations:  Activity:  As tolerated Diet:  Regular [facility] on Feb 18 Is patient on multiple antipsychotic therapies at discharge:  No   Has Patient had three or more failed trials of antipsychotic monotherapy by history:  No  Recommended Plan for Multiple Antipsychotic Therapies: N/A   Arrianna Catala A 12/08/2012, 12:38 PM

## 2012-12-08 NOTE — Progress Notes (Signed)
Patient discharged home with family.  He was given discharge instructions, follow up info, prescriptions, and sample medications.  Personal belongings returned to patient.

## 2012-12-12 NOTE — Progress Notes (Signed)
Patient Discharge Instructions:  After Visit Summary (AVS):   Faxed to:  12/12/12 Discharge Summary Note:   Faxed to:  12/12/12 Psychiatric Admission Assessment Note:   Faxed to:  12/12/12 Suicide Risk Assessment - Discharge Assessment:   Faxed to:  12/12/12 Faxed/Sent to the Next Level Care provider:  12/12/12 Faxed to [facility] @ [phone]  Jerelene Redden, 12/12/2012, 4:09 PM

## 2012-12-16 NOTE — Discharge Summary (Signed)
Agree with assessment and plan Parris Signer A. Kemyah Buser, M.D. 

## 2013-03-04 ENCOUNTER — Encounter (HOSPITAL_COMMUNITY): Payer: Self-pay | Admitting: *Deleted

## 2013-03-04 ENCOUNTER — Emergency Department (EMERGENCY_DEPARTMENT_HOSPITAL)
Admission: EM | Admit: 2013-03-04 | Discharge: 2013-03-05 | Disposition: A | Discharge status: Other Acute Inpatient Hospital | Payer: Medicare Other | Source: Home / Self Care | Attending: Emergency Medicine | Admitting: Emergency Medicine

## 2013-03-04 DIAGNOSIS — F319 Bipolar disorder, unspecified: Secondary | ICD-10-CM | POA: Insufficient documentation

## 2013-03-04 DIAGNOSIS — Z79899 Other long term (current) drug therapy: Secondary | ICD-10-CM | POA: Insufficient documentation

## 2013-03-04 DIAGNOSIS — F1910 Other psychoactive substance abuse, uncomplicated: Secondary | ICD-10-CM | POA: Insufficient documentation

## 2013-03-04 DIAGNOSIS — Z8659 Personal history of other mental and behavioral disorders: Secondary | ICD-10-CM | POA: Insufficient documentation

## 2013-03-04 DIAGNOSIS — F17200 Nicotine dependence, unspecified, uncomplicated: Secondary | ICD-10-CM | POA: Insufficient documentation

## 2013-03-04 LAB — COMPREHENSIVE METABOLIC PANEL
ALT: 30 U/L (ref 0–53)
AST: 26 U/L (ref 0–37)
Albumin: 4.3 g/dL (ref 3.5–5.2)
Alkaline Phosphatase: 94 U/L (ref 39–117)
BUN: 6 mg/dL (ref 6–23)
CO2: 26 mEq/L (ref 19–32)
Calcium: 9.7 mg/dL (ref 8.4–10.5)
Chloride: 93 mEq/L — ABNORMAL LOW (ref 96–112)
Creatinine, Ser: 0.93 mg/dL (ref 0.50–1.35)
GFR calc Af Amer: >90 mL/min (ref >90)
GFR calc non Af Amer: >90 mL/min (ref >90)
Glucose, Bld: 78 mg/dL (ref 70–99)
Potassium: 3.9 mEq/L (ref 3.5–5.1)
Sodium: 131 mEq/L — ABNORMAL LOW (ref 135–145)
Total Bilirubin: 0.4 mg/dL (ref 0.3–1.2)
Total Protein: 7.9 g/dL (ref 6.0–8.3)

## 2013-03-04 LAB — CBC
HCT: 45.9 % (ref 39.0–52.0)
Hemoglobin: 15.6 g/dL (ref 13.0–17.0)
MCH: 31.6 pg (ref 26.0–34.0)
MCHC: 34 g/dL (ref 30.0–36.0)
MCV: 92.9 fL (ref 78.0–100.0)
Platelets: 344 10*3/uL (ref 150–400)
RBC: 4.94 MIL/uL (ref 4.22–5.81)
RDW: 14 % (ref 11.5–15.5)
WBC: 11.2 10*3/uL — ABNORMAL HIGH (ref 4.0–10.5)

## 2013-03-04 LAB — RAPID URINE DRUG SCREEN, HOSP PERFORMED
Amphetamines: NOT DETECTED
Barbiturates: NOT DETECTED
Benzodiazepines: NOT DETECTED
Cocaine: POSITIVE — AB
Opiates: NOT DETECTED
Tetrahydrocannabinol: NOT DETECTED

## 2013-03-04 LAB — ETHANOL: Alcohol, Ethyl (B): 66 mg/dL — ABNORMAL HIGH (ref 0–11)

## 2013-03-04 MED ORDER — ACETAMINOPHEN 325 MG PO TABS: 650.0000 mg | ORAL_TABLET | ORAL | Status: DC | PRN

## 2013-03-04 MED ORDER — ONDANSETRON HCL 4 MG PO TABS: 4.0000 mg | ORAL_TABLET | Freq: Three times a day (TID) | ORAL | Status: DC | PRN

## 2013-03-04 MED ORDER — IBUPROFEN 600 MG PO TABS: 600.0000 mg | ORAL_TABLET | Freq: Three times a day (TID) | ORAL | Status: DC | PRN

## 2013-03-04 MED ORDER — ZOLPIDEM TARTRATE 5 MG PO TABS
5.0000 mg | ORAL_TABLET | Freq: Every evening | ORAL | Status: DC | PRN
  Administered 2013-03-04: 5 mg via ORAL
  Filled 2013-03-04: qty 1

## 2013-03-04 MED ORDER — LORAZEPAM 2 MG/ML IJ SOLN: 1.0000 mg | Freq: Four times a day (QID) | INTRAMUSCULAR | Status: DC | PRN

## 2013-03-04 MED ORDER — NICOTINE 21 MG/24HR TD PT24
21.0000 mg | MEDICATED_PATCH | Freq: Every day | TRANSDERMAL | Status: DC
  Filled 2013-03-04: qty 1

## 2013-03-04 MED ORDER — LORAZEPAM 1 MG PO TABS
0.0000 mg | ORAL_TABLET | Freq: Two times a day (BID) | ORAL | Status: DC
Start: 2013-03-06 — End: 2013-03-05

## 2013-03-04 MED ORDER — LORAZEPAM 1 MG PO TABS
1.0000 mg | ORAL_TABLET | Freq: Four times a day (QID) | ORAL | Status: DC | PRN
  Administered 2013-03-05: 1 mg via ORAL
  Filled 2013-03-04: qty 1

## 2013-03-04 MED ORDER — LORAZEPAM 1 MG PO TABS: 0.0000 mg | ORAL_TABLET | Freq: Four times a day (QID) | ORAL | Status: DC

## 2013-03-04 MED ORDER — VITAMIN B-1 100 MG PO TABS
100.0000 mg | ORAL_TABLET | Freq: Every day | ORAL | Status: DC
  Administered 2013-03-04 – 2013-03-05 (×2): 100 mg via ORAL
  Filled 2013-03-04 (×2): qty 1

## 2013-03-04 MED ORDER — FOLIC ACID 1 MG PO TABS
1.0000 mg | ORAL_TABLET | Freq: Every day | ORAL | Status: DC
  Administered 2013-03-04 – 2013-03-05 (×2): 1 mg via ORAL
  Filled 2013-03-04 (×2): qty 1

## 2013-03-04 MED ORDER — ADULT MULTIVITAMIN W/MINERALS CH
1.0000 | ORAL_TABLET | Freq: Every day | ORAL | Status: DC
  Administered 2013-03-04 – 2013-03-05 (×2): 1 via ORAL
  Filled 2013-03-04 (×2): qty 1

## 2013-03-04 MED ORDER — THIAMINE HCL 100 MG/ML IJ SOLN: 100.0000 mg | Freq: Every day | INTRAMUSCULAR | Status: DC

## 2013-03-04 NOTE — ED Provider Notes (Addendum)
History    This chart was scribed for Jaynie Crumble, PA working with Raeford Razor, MD by ED Scribe, Burman Nieves. This patient was seen in room [facility]2/[facility]2 and the patient's care was started at 9:19 PM.   CSN: [id number]  Arrival date & time 03/04/13  1947   First MD Initiated Contact with Patient 03/04/13 2119      Chief Complaint  Patient presents with  . Medical Clearance    (Consider location/radiation/quality/duration/timing/severity/associated sxs/prior treatment) The history is provided by the patient. No language interpreter was used.   HPI Comments: Curtis Blankenship is a 32 y.o. male who presents to the Emergency Department reporting use of Benzo's and alcohol abuse and wants detox. Pt states that he has been on a 5 day binge reports using cocaine, benzo's, and alcohol. Pt reports taking xanax, cocaine, and 3 40 oz beers in the past 8 hours. Pt reports he is sick of being sick and tired. He reports of SI's and has thought about a couple different scenarios. Pt had a prescription for benzo's in the past in which he told his doctor he had been drinking on them. Pt denies any hallucinations, HI, fever, chills, cough, nausea, vomiting, diarrhea, SOB, weakness, and any other associated symptoms.   Past Medical History  Diagnosis Date  . Bipolar disorder   . PTSD (post-traumatic stress disorder)     Past Surgical History  Procedure Laterality Date  . Appendectomy      History reviewed. No pertinent family history.  History  Substance Use Topics  . Smoking status: Current Every Day Smoker -- 1.50 packs/day for 10 years    Types: Cigarettes  . Smokeless tobacco: Former Neurosurgeon    Types: Snuff  . Alcohol Use: 97.2 oz/week    150 Cans of beer, 12 Shots of liquor per week      Review of Systems  Psychiatric/Behavioral: Positive for suicidal ideas, self-injury and dysphoric mood.  All other systems reviewed and are negative.    Allergies  Review of patient's allergies  indicates no known allergies.  Home Medications   Current Outpatient Rx  Name  Route  Sig  Dispense  Refill  . disulfiram (ANTABUSE) 250 MG tablet   Oral   Take 250 mg by mouth daily. For alcohol cravings         . divalproex (DEPAKOTE ER) 250 MG 24 hr tablet   Oral   Take 1 tablet (250 mg total) by mouth 3 (three) times daily. For mood stabilization   90 tablet   0   . mirtazapine (REMERON) 30 MG tablet   Oral   Take 1 tablet (30 mg total) by mouth at bedtime. Depression/anxiety/sleep   30 tablet   0   . zolpidem (AMBIEN) 10 MG tablet   Oral   Take 1 tablet (10 mg total) by mouth at bedtime as needed. For sleep.   30 tablet        BP 132/86  Pulse 103  Temp(Src) 98.7 F (37.1 C) (Oral)  Resp 20  SpO2 96%  Physical Exam  Nursing note and vitals reviewed. Constitutional: He appears well-developed and well-nourished. No distress.  Eyes: Conjunctivae are normal.  Neck: Neck supple.  Cardiovascular: Normal rate, regular rhythm and normal heart sounds.   Pulmonary/Chest: Effort normal and breath sounds normal. No respiratory distress. He has no wheezes. He has no rales.  Neurological: He is alert.  Skin: Skin is warm and dry.  Psychiatric: He has a normal mood and affect.  His behavior is normal. Thought content normal.    ED Course  Procedures (including critical care time) DIAGNOSTIC STUDIES: Oxygen Saturation is 96% on room air, adequate by my interpretation.    COORDINATION OF CARE: 9:35 PM Discussed ED treatment with pt and pt agrees.     PT requesting detox from alcohol and benzos. Everyday drinker. Admits to SI. Spoke with act. Plan to Telepsych and ACT consult.   Results for orders placed during the hospital encounter of 03/04/13  CBC      Result Value Range   WBC 11.2 (*) 4.0 - 10.5 K/uL   RBC 4.94  4.22 - 5.81 MIL/uL   Hemoglobin 15.6  13.0 - 17.0 g/dL   HCT 16.1  09.6 - 04.5 %   MCV 92.9  78.0 - 100.0 fL   MCH 31.6  26.0 - 34.0 pg   MCHC  34.0  30.0 - 36.0 g/dL   RDW 40.9  81.1 - 91.4 %   Platelets 344  150 - 400 K/uL  COMPREHENSIVE METABOLIC PANEL      Result Value Range   Sodium 131 (*) 135 - 145 mEq/L   Potassium 3.9  3.5 - 5.1 mEq/L   Chloride 93 (*) 96 - 112 mEq/L   CO2 26  19 - 32 mEq/L   Glucose, Bld 78  70 - 99 mg/dL   BUN 6  6 - 23 mg/dL   Creatinine, Ser 7.82  0.50 - 1.35 mg/dL   Calcium 9.7  8.4 - 95.6 mg/dL   Total Protein 7.9  6.0 - 8.3 g/dL   Albumin 4.3  3.5 - 5.2 g/dL   AST 26  0 - 37 U/L   ALT 30  0 - 53 U/L   Alkaline Phosphatase 94  39 - 117 U/L   Total Bilirubin 0.4  0.3 - 1.2 mg/dL   GFR calc non Af Amer >90  >90 mL/min   GFR calc Af Amer >90  >90 mL/min  ETHANOL      Result Value Range   Alcohol, Ethyl (B) 66 (*) 0 - 11 mg/dL  URINE RAPID DRUG SCREEN (HOSP PERFORMED)      Result Value Range   Opiates NONE DETECTED  NONE DETECTED   Cocaine POSITIVE (*) NONE DETECTED   Benzodiazepines NONE DETECTED  NONE DETECTED   Amphetamines NONE DETECTED  NONE DETECTED   Tetrahydrocannabinol NONE DETECTED  NONE DETECTED   Barbiturates NONE DETECTED  NONE DETECTED   No results found.  Pt with polysubstance abuse. Medically cleared. ACT to assess. Signed out to oncoming team  1. Polysubstance abuse       MDM    I personally performed the services described in this documentation, which was scribed in my presence. The recorded information has been reviewed and is accurate.    Lottie Mussel, PA-C 03/06/13 0452  Lottie Mussel, PA-C 04/08/13 [phone]

## 2013-03-04 NOTE — ED Notes (Signed)
Pt states "I've been on a 5 day binge." reports using coke, benzo's and alcohol. Reports last use was 8-10 hours ago. Pt reports taking xanax, cocaine and 3-40oz beers in past 8hrs. Pt also reports SI. States "I've thought about a couple different scenarios."

## 2013-03-05 ENCOUNTER — Inpatient Hospital Stay (HOSPITAL_COMMUNITY)
Admission: AD | Admit: 2013-03-05 | Discharge: 2013-03-16 | DRG: 897 | Disposition: A | Discharge status: Home / Self Care | Payer: Medicare Other | Source: Intra-hospital | Attending: Psychiatry | Admitting: Psychiatry

## 2013-03-05 ENCOUNTER — Encounter (HOSPITAL_COMMUNITY): Payer: Self-pay | Admitting: *Deleted

## 2013-03-05 DIAGNOSIS — F1410 Cocaine abuse, uncomplicated: Secondary | ICD-10-CM

## 2013-03-05 DIAGNOSIS — F1010 Alcohol abuse, uncomplicated: Secondary | ICD-10-CM

## 2013-03-05 DIAGNOSIS — F329 Major depressive disorder, single episode, unspecified: Secondary | ICD-10-CM | POA: Diagnosis present

## 2013-03-05 DIAGNOSIS — F32A Depression, unspecified: Secondary | ICD-10-CM | POA: Diagnosis present

## 2013-03-05 DIAGNOSIS — G988 Other disorders of nervous system: Secondary | ICD-10-CM | POA: Diagnosis present

## 2013-03-05 DIAGNOSIS — F1020 Alcohol dependence, uncomplicated: Principal | ICD-10-CM | POA: Diagnosis present

## 2013-03-05 DIAGNOSIS — F4310 Post-traumatic stress disorder, unspecified: Secondary | ICD-10-CM | POA: Diagnosis present

## 2013-03-05 DIAGNOSIS — Z79899 Other long term (current) drug therapy: Secondary | ICD-10-CM

## 2013-03-05 DIAGNOSIS — G969 Disorder of central nervous system, unspecified: Secondary | ICD-10-CM

## 2013-03-05 DIAGNOSIS — F1910 Other psychoactive substance abuse, uncomplicated: Secondary | ICD-10-CM

## 2013-03-05 DIAGNOSIS — F1994 Other psychoactive substance use, unspecified with psychoactive substance-induced mood disorder: Secondary | ICD-10-CM

## 2013-03-05 HISTORY — DX: Major depressive disorder, single episode, unspecified: F32.9

## 2013-03-05 HISTORY — DX: Depression, unspecified: F32.A

## 2013-03-05 MED ORDER — ADULT MULTIVITAMIN W/MINERALS CH
1.0000 | ORAL_TABLET | Freq: Every day | ORAL | Status: DC
  Administered 2013-03-06 – 2013-03-16 (×11): 1 via ORAL
  Filled 2013-03-05 (×13): qty 1

## 2013-03-05 MED ORDER — ALUM & MAG HYDROXIDE-SIMETH 200-200-20 MG/5ML PO SUSP: 30.0000 mL | ORAL | Status: DC | PRN

## 2013-03-05 MED ORDER — NICOTINE 21 MG/24HR TD PT24
21.0000 mg | MEDICATED_PATCH | Freq: Every day | TRANSDERMAL | Status: DC
  Administered 2013-03-06 – 2013-03-15 (×10): 21 mg via TRANSDERMAL
  Filled 2013-03-05 (×15): qty 1

## 2013-03-05 MED ORDER — MIRTAZAPINE 30 MG PO TABS: 30.0000 mg | ORAL_TABLET | Freq: Every day | ORAL | Status: DC

## 2013-03-05 MED ORDER — ZOLPIDEM TARTRATE 10 MG PO TABS: 10.0000 mg | ORAL_TABLET | Freq: Every evening | ORAL | Status: DC | PRN

## 2013-03-05 MED ORDER — CHLORDIAZEPOXIDE HCL 25 MG PO CAPS
25.0000 mg | ORAL_CAPSULE | Freq: Four times a day (QID) | ORAL | Status: AC
  Administered 2013-03-06 – 2013-03-07 (×6): 25 mg via ORAL
  Filled 2013-03-05 (×7): qty 1

## 2013-03-05 MED ORDER — TRAZODONE HCL 50 MG PO TABS
50.0000 mg | ORAL_TABLET | Freq: Every evening | ORAL | Status: DC | PRN
  Administered 2013-03-07: 50 mg via ORAL
  Filled 2013-03-05: qty 1
  Filled 2013-03-05: qty 3

## 2013-03-05 MED ORDER — CHLORDIAZEPOXIDE HCL 25 MG PO CAPS
50.0000 mg | ORAL_CAPSULE | Freq: Once | ORAL | Status: AC
  Administered 2013-03-05: 50 mg via ORAL
  Filled 2013-03-05: qty 2

## 2013-03-05 MED ORDER — CHLORDIAZEPOXIDE HCL 25 MG PO CAPS
25.0000 mg | ORAL_CAPSULE | ORAL | Status: AC
  Administered 2013-03-08 – 2013-03-09 (×2): 25 mg via ORAL
  Filled 2013-03-05 (×2): qty 1

## 2013-03-05 MED ORDER — MAGNESIUM HYDROXIDE 400 MG/5ML PO SUSP: 30.0000 mL | Freq: Every day | ORAL | Status: DC | PRN

## 2013-03-05 MED ORDER — THIAMINE HCL 100 MG/ML IJ SOLN: 100.0000 mg | Freq: Once | INTRAMUSCULAR | Status: DC

## 2013-03-05 MED ORDER — LOPERAMIDE HCL 2 MG PO CAPS: 2.0000 mg | ORAL_CAPSULE | ORAL | Status: AC | PRN

## 2013-03-05 MED ORDER — ONDANSETRON 4 MG PO TBDP: 4.0000 mg | ORAL_TABLET | Freq: Four times a day (QID) | ORAL | Status: AC | PRN

## 2013-03-05 MED ORDER — ACETAMINOPHEN 325 MG PO TABS: 650.0000 mg | ORAL_TABLET | Freq: Four times a day (QID) | ORAL | Status: DC | PRN

## 2013-03-05 MED ORDER — CHLORDIAZEPOXIDE HCL 25 MG PO CAPS
25.0000 mg | ORAL_CAPSULE | Freq: Four times a day (QID) | ORAL | Status: AC | PRN
  Administered 2013-03-07 (×2): 25 mg via ORAL
  Filled 2013-03-05 (×2): qty 1

## 2013-03-05 MED ORDER — DIVALPROEX SODIUM ER 250 MG PO TB24: 250.0000 mg | ORAL_TABLET | Freq: Three times a day (TID) | ORAL | Status: DC

## 2013-03-05 MED ORDER — VITAMIN B-1 100 MG PO TABS
100.0000 mg | ORAL_TABLET | Freq: Every day | ORAL | Status: DC
  Administered 2013-03-06 – 2013-03-16 (×11): 100 mg via ORAL
  Filled 2013-03-05 (×13): qty 1

## 2013-03-05 MED ORDER — CHLORDIAZEPOXIDE HCL 25 MG PO CAPS
25.0000 mg | ORAL_CAPSULE | Freq: Every day | ORAL | Status: AC
Start: 2013-03-10 — End: 2013-03-10
  Administered 2013-03-10: 25 mg via ORAL
  Filled 2013-03-05: qty 1

## 2013-03-05 MED ORDER — DISULFIRAM 250 MG PO TABS: 250.0000 mg | ORAL_TABLET | Freq: Every day | ORAL | Status: DC

## 2013-03-05 MED ORDER — HYDROXYZINE HCL 25 MG PO TABS
25.0000 mg | ORAL_TABLET | Freq: Four times a day (QID) | ORAL | Status: DC | PRN
  Administered 2013-03-07 – 2013-03-08 (×3): 25 mg via ORAL

## 2013-03-05 MED ORDER — CHLORDIAZEPOXIDE HCL 25 MG PO CAPS
25.0000 mg | ORAL_CAPSULE | Freq: Three times a day (TID) | ORAL | Status: AC
  Administered 2013-03-07 – 2013-03-08 (×2): 25 mg via ORAL
  Filled 2013-03-05 (×2): qty 1

## 2013-03-05 NOTE — Progress Notes (Signed)
32 year old male pt admitted on voluntary basis. On admission pt reports that he went on a 6 day bender in regards to drinking and drugs and at some point became depressed and suicidal. Pt was here for similar circumstances back in February. Pt stated that he has no transportation to follow up with appointments and when he ran out of antabuse he began to drink again. Pt able to contract for safety on the unit. Pt did state that he goes to [employer] and also to the [place] in [place] but has not been in awhile due to transportation issues. Pt was oriented to the unit and safety maintained.

## 2013-03-05 NOTE — BH Assessment (Signed)
Assessment Note   Curtis Blankenship is an 32 y.o. male who presents to [facility] ED reporting use of Benzo's and alcohol abuse and wants detox. Patient was not forthcoming with information stating. He was also a poor historian by choice. Writer attempted to assess the patient.  Pt sts "I have been asked these questions and I'm not answering anymore". Writer apologized for the repetitive questions asked duriing his stay here in the ED but explained the importance of the questions. Patient continued to refuse answering question asking this Clinical research associate for a suggestion box. Sts, "I need pen, paper,and the box b/c I have a complaint about how many times I've been asked the same questions over and over". He says that staff is "beating a dead horse and needs a better system". Patient obviously irritated stated that he was not answering any further questions. He turned his back to this writer putting covers over his head stating, "Leave me alone and get out of here I'm trying to rest and sleep". Writer exited the room and notified patients nurse-Sheila and EDP-Nanavonti of patient's uncoooperativeness.  Writer unable to confirm if patient is SI, HI, or having symptoms related to [facility]. Patient's mental health history is unk. Patient's stressors are unk. Patient does not provide information for symptoms related to anxiety of depression. Patient admitted to staff that he is using substances, however; refused to provide details during the assessment.  Collateral information:  Clinical research associate obtained information from EDP notes. "Pt states that he has been on a 5 day binge reports using cocaine, benzo's, and alcohol. Pt reports taking xanax, cocaine, and 3 40 oz beers in the past 8 hours. Pt reports he is sick of being sick and tired. He reports of SI's and has thought about a couple different scenarios. Pt had a prescription for benzo's in the past in which he told his doctor he had been drinking on them."  Another note from  nursing staff reported that he came to ED because of ;"sick and tired of been sick and tired". His affect blunted and he seemed sad . Patient reported that he has been drinking a lot; "I drink till I pass out". He denied SI/HI and denied hallucinations.   Another staff noted that patient says "I've been on a 5 day binge." reports using coke, benzo's and alcohol. Reports last use was 8-10 hours ago. Pt reports taking xanax, cocaine and 3-40oz beers in past 8hrs. Pt also reports SI. States "I've thought about a couple different scenarios."    Axis I: Substance Induced Mood Disorder Axis II: Deferred Axis III:  Past Medical History  Diagnosis Date  . Bipolar disorder   . PTSD (post-traumatic stress disorder)    Axis IV: other psychosocial or environmental problems and problems related to social environment Axis V: 31-40 impairment in reality testing  Past Medical History:  Past Medical History  Diagnosis Date  . Bipolar disorder   . PTSD (post-traumatic stress disorder)     Past Surgical History  Procedure Laterality Date  . Appendectomy      Family History: History reviewed. No pertinent family history.  Social History:  reports that he has been smoking Cigarettes.  He has a 15 pack-year smoking history. He has quit using smokeless tobacco. His smokeless tobacco use included Snuff. He reports that he drinks about 97.2 ounces of alcohol per week. He reports that he uses illicit drugs (Cocaine and Other-see comments) about 3 times per week.  Additional Social History:  Alcohol /  Drug Use Pain Medications: SEE MAR Prescriptions: SEE MAR Over the Counter: SEE MAR History of alcohol / drug use?: No history of alcohol / drug abuse Substance #1 Name of Substance 1: Alcohol -pt refused to provide details; BAL 66 Substance #2 Name of Substance 2: Benzo's-pt refused to provide details; UDS negative Substance #3 Name of Substance 3: Cocaine-pt rfused to provide details;UDS  positive  CIWA: CIWA-Ar BP: 103/60 mmHg Pulse Rate: 63 Nausea and Vomiting: no nausea and no vomiting Tactile Disturbances: none Tremor: not visible, but can be felt fingertip to fingertip Auditory Disturbances: not present Paroxysmal Sweats: barely perceptible sweating, palms moist Visual Disturbances: not present Anxiety: mildly anxious Headache, Fullness in Head: none present Agitation: somewhat more than normal activity Orientation and Clouding of Sensorium: oriented and can do serial additions CIWA-Ar Total: 4 COWS: Clinical Opiate Withdrawal Scale (COWS) Resting Pulse Rate: Pulse Rate 101-120 Sweating: No report of chills or flushing Restlessness: Able to sit still Pupil Size: Pupils pinned or normal size for room light Bone or Joint Aches: Not present GI Upset: No GI symptoms Tremor: No tremor Yawning: No yawning Anxiety or Irritability: None  Allergies: No Known Allergies  Home Medications:  (Not in a hospital admission)  OB/GYN Status:  No LMP for male patient.  General Assessment Data Location of Assessment: [facility] Living Arrangements: Other (Comment) (lives with sister) Can pt return to current living arrangement?: Yes Admission Status: Voluntary Is patient capable of signing voluntary admission?: Yes Transfer from: Acute Hospital Referral Source: Self/Family/Friend     Risk to self Suicidal Ideation: Yes-Currently Present (Per EDP notes patient reported SI, patient refused to talk) Suicidal Intent:  (patient refused to provide a response to question) Is patient at risk for suicide?:  (pt refused to provide a response to question) Suicidal Plan?: No (pt told EDP "several different scenarios") Access to Means: No (unk; patient refused to provide information) What has been your use of drugs/alcohol within the last 12 months?:  (patient refused to respond) Previous Attempts/Gestures:  (unk; pt would not provide response) How many times?:  (unk) Other Self  Harm Risks:  (n/a) Triggers for Past Attempts: Other (Comment) (unk hx; pt did not provide response) Intentional Self Injurious Behavior: None Family Suicide History: Unknown Recent stressful life event(s): Other (Comment) (patient refused to answer) Persecutory voices/beliefs?: No Depression: Yes Depression Symptoms: Feeling angry/irritable (pt refused to answer but appears angry and irritable) Substance abuse history and/or treatment for substance abuse?: No Suicide prevention information given to non-admitted patients: Not applicable  Risk to Others Homicidal Ideation: No (patient did not confrim or deny; no notes indicating HI) Thoughts of Harm to Others: No Current Homicidal Intent:  (unable to confirm or deny) Current Homicidal Plan: No Access to Homicidal Means: No Identified Victim:  (n/a) History of harm to others?: No Assessment of Violence: None Noted Violent Behavior Description:  (patient uncooperative with answering questions during assmnt) Does patient have access to weapons?: No Criminal Charges Pending?: No Does patient have a court date: No  Psychosis Hallucinations: None noted (pt refused question; does not confirm or deny; unk) Delusions: None noted;Unspecified (pt does not confirm or deny; no history)  Mental Status Report Appear/Hygiene: Disheveled Eye Contact: Poor (patient turns back toward this Clinical research associate) Motor Activity: Agitation;Restlessness Speech: Logical/coherent Level of Consciousness: Alert Mood: Angry Affect: Anxious;Irritable Anxiety Level: Severe Thought Processes: Relevant Judgement: Unimpaired Orientation: Person;Place;Time;Situation Obsessive Compulsive Thoughts/Behaviors: None  Cognitive Functioning Concentration: Decreased Memory: Recent Intact;Remote Intact IQ: Average Insight: Fair Impulse Control:  Poor Appetite:  (unk; pt refused to answer question) Weight Loss:  (unk) Weight Gain:  (unk) Sleep:  (patient sleeping here in  psych ED ok) Total Hours of Sleep:  (unk; pt would not respond) Vegetative Symptoms: None  ADLScreening [facility] Assessment Services) Patient's cognitive ability adequate to safely complete daily activities?: Yes Patient able to express need for assistance with ADLs?: Yes Independently performs ADLs?: Yes (appropriate for developmental age)  Abuse/Neglect [facility]) Physical Abuse: Denies Verbal Abuse: Denies Sexual Abuse: Denies  Prior Inpatient Therapy Prior Inpatient Therapy:  (unk; pt would not respond to question) Prior Therapy Dates:  (n/a) Prior Therapy Facilty/Provider(s):  (n/a) Reason for Treatment:  (n/a)  Prior Outpatient Therapy Prior Outpatient Therapy: No Prior Therapy Dates:  (n/a) Prior Therapy Facilty/Provider(s):  (n/a) Reason for Treatment:  (n/a)  ADL Screening (condition at time of admission) Patient's cognitive ability adequate to safely complete daily activities?: Yes Patient able to express need for assistance with ADLs?: Yes Independently performs ADLs?: Yes (appropriate for developmental age) Weakness of Legs: None Weakness of Arms/Hands: None  Home Assistive Devices/Equipment Home Assistive Devices/Equipment: None    Abuse/Neglect Assessment (Assessment to be complete while patient is alone) Physical Abuse: Denies Verbal Abuse: Denies Sexual Abuse: Denies Exploitation of patient/patient's resources: Denies Self-Neglect: Denies Values / Beliefs Cultural Requests During Hospitalization: None Spiritual Requests During Hospitalization: None   Advance Directives (For Healthcare) Advance Directive: Patient does not have advance directive Nutrition Screen- [facility] Adult/[facility] Patient's home diet: Regular  Additional Information 1:1 In Past 12 Months?: No CIRT Risk:  (unk) Elopement Risk: No Does patient have medical clearance?: Yes     Disposition:  Disposition Initial Assessment Completed for this Encounter: Yes Disposition of Patient: Inpatient  treatment program Type of inpatient treatment program: Adult  On Site Evaluation by:   Reviewed with Physician:     Octaviano Batty 03/05/2013 2:29 PM

## 2013-03-05 NOTE — ED Notes (Signed)
Patient resting comfortably. No distress noted.

## 2013-03-05 NOTE — Tx Team (Signed)
Initial Interdisciplinary Treatment Plan  PATIENT STRENGTHS: (choose at least two) Ability for insight Average or above average intelligence  PATIENT STRESSORS: Medication change or noncompliance Substance abuse   PROBLEM LIST: Problem List/Patient Goals Date to be addressed Date deferred Reason deferred Estimated date of resolution  Depression 03/05/13     Substance Abuse 03/05/13     Suicidal Ideation 03/05/13                                          DISCHARGE CRITERIA:  Improved stabilization in mood, thinking, and/or behavior Withdrawal symptoms are absent or subacute and managed without 24-hour nursing intervention  PRELIMINARY DISCHARGE PLAN: Attend aftercare/continuing care group Placement in alternative living arrangements  PATIENT/FAMIILY INVOLVEMENT: This treatment plan has been presented to and reviewed with the patient, Dontel Harshberger, and/or family member, .  The patient and family have been given the opportunity to ask questions and make suggestions.  Azaiah Mello, [place] 03/05/2013, 8:21 PM

## 2013-03-05 NOTE — Consult Note (Signed)
Reason for Consult: Cocaine intoxication, alcohol abuse and substance induced mood disorder Referring Physician: Dr. Kathaleen Maser is an 32 y.o. male.  HPI: This is a 32 y.o. single Caucasian male who presents to the Emergency Department while intoxicated with her drug of abuse including Benzo's and alcohol abuse and wants detox. Patient stated that I need help and I feeling sick and tired of being sick and tired my using drugs of Abuse. Pt states that he has been on a 5 day binge reports using cocaine, benzo's, and alcohol. Pt reports taking xanax, cocaine, and 3 40 oz beers in the past 8 hours. He reports of SI's and has thought about a couple different scenarios. Pt denies any hallucinations, fever, chills, cough, nausea, vomiting, diarrhea, weakness, and other associated symptoms. Reportedly patient has previous substance abuse treatment in [facility]. Patient was in the [employer] between 2003-2008 and served in [place]. Patient reported that he does not prefer to go to the [facility] for treatment and wanted to look for other options. Patient also reported he was diagnosed with posttraumatic stress disorder but unable to provide details.   Mental Status Examination: Patient appeared as per his stated age, poorly groomed, and maintaining good eye contact. Patient has depressed and anxious mood and his affect is irritable and point. He has normal rate, rhythm, and volume of speech. His thought process is linear and goal directed. Patient has suicidal, but denied homicidal ideations, intentions or plans. Patient has no evidence of auditory or visual hallucinations, delusions, and paranoia. Patient has poor insight judgment and impulse control.  Past Medical History  Diagnosis Date  . Bipolar disorder   . PTSD (post-traumatic stress disorder)     Past Surgical History  Procedure Laterality Date  . Appendectomy      History reviewed. No pertinent family history.  Social History:  reports  that he has been smoking Cigarettes.  He has a 15 pack-year smoking history. He has quit using smokeless tobacco. His smokeless tobacco use included Snuff. He reports that he drinks about 97.2 ounces of alcohol per week. He reports that he uses illicit drugs (Cocaine and Other-see comments) about 3 times per week.  Allergies: No Known Allergies  Medications: I have reviewed the patient's current medications.  Results for orders placed during the hospital encounter of 03/04/13 (from the past 48 hour(s))  CBC     Status: Abnormal   Collection Time    03/04/13  9:10 PM      Result Value Range   WBC 11.2 (*) 4.0 - 10.5 K/uL   RBC 4.94  4.22 - 5.81 MIL/uL   Hemoglobin 15.6  13.0 - 17.0 g/dL   HCT 16.1  09.6 - 04.5 %   MCV 92.9  78.0 - 100.0 fL   MCH 31.6  26.0 - 34.0 pg   MCHC 34.0  30.0 - 36.0 g/dL   RDW 40.9  81.1 - 91.4 %   Platelets 344  150 - 400 K/uL  COMPREHENSIVE METABOLIC PANEL     Status: Abnormal   Collection Time    03/04/13  9:10 PM      Result Value Range   Sodium 131 (*) 135 - 145 mEq/L   Potassium 3.9  3.5 - 5.1 mEq/L   Chloride 93 (*) 96 - 112 mEq/L   CO2 26  19 - 32 mEq/L   Glucose, Bld 78  70 - 99 mg/dL   BUN 6  6 - 23 mg/dL  Creatinine, Ser 0.93  0.50 - 1.35 mg/dL   Calcium 9.7  8.4 - 86.5 mg/dL   Total Protein 7.9  6.0 - 8.3 g/dL   Albumin 4.3  3.5 - 5.2 g/dL   AST 26  0 - 37 U/L   ALT 30  0 - 53 U/L   Alkaline Phosphatase 94  39 - 117 U/L   Total Bilirubin 0.4  0.3 - 1.2 mg/dL   GFR calc non Af Amer >90  >90 mL/min   GFR calc Af Amer >90  >90 mL/min   Comment:            The eGFR has been calculated     using the CKD EPI equation.     This calculation has not been     validated in all clinical     situations.     eGFR's persistently     <90 mL/min signify     possible Chronic Kidney Disease.  ETHANOL     Status: Abnormal   Collection Time    03/04/13  9:10 PM      Result Value Range   Alcohol, Ethyl (B) 66 (*) 0 - 11 mg/dL   Comment:             LOWEST DETECTABLE LIMIT FOR     SERUM ALCOHOL IS 11 mg/dL     FOR MEDICAL PURPOSES ONLY  URINE RAPID DRUG SCREEN (HOSP PERFORMED)     Status: Abnormal   Collection Time    03/04/13  9:19 PM      Result Value Range   Opiates NONE DETECTED  NONE DETECTED   Cocaine POSITIVE (*) NONE DETECTED   Benzodiazepines NONE DETECTED  NONE DETECTED   Amphetamines NONE DETECTED  NONE DETECTED   Tetrahydrocannabinol NONE DETECTED  NONE DETECTED   Barbiturates NONE DETECTED  NONE DETECTED   Comment:            DRUG SCREEN FOR MEDICAL PURPOSES     ONLY.  IF CONFIRMATION IS NEEDED     FOR ANY PURPOSE, NOTIFY LAB     WITHIN 5 DAYS.                LOWEST DETECTABLE LIMITS     FOR URINE DRUG SCREEN     Drug Class       Cutoff (ng/mL)     Amphetamine      1000     Barbiturate      200     Benzodiazepine   200     Tricyclics       300     Opiates          300     Cocaine          300     THC              50    No results found.  Positive for anxiety, bad mood, behavior problems, depression, excessive alcohol consumption, illegal drug usage, sleep disturbance and tobacco use Blood pressure 103/60, pulse 63, temperature 97.3 F (36.3 C), temperature source Oral, resp. rate 18, SpO2 95.00%.   Assessment/Plan: Polysubstance abuse versus dependence Cocaine intoxication Alcohol abuse Substance induced mood disorder  Recommendation: Recommended acute psychiatric hospitalization for detox treatment and medication management.  Osie Amparo,JANARDHAHA R. 03/05/2013, 4:09 PM

## 2013-03-05 NOTE — ED Notes (Signed)
Patient arrived on this unit at about 2300. He reported that he came to ED because of ;"sick and tired of been sick and tired". His affect blunted and he seemed sad . Patient reported that he has been drinking a lot; "I drink till I pass out". He denied SI/HI and denied hallucinations. Writer offered patient  Snack/ drink and his HS medications. He denied Withdrawal symptoms and denied pain. Q 15 minute check continues as ordered to maintain safety.

## 2013-03-05 NOTE — ED Notes (Signed)
Patient is resting comfortably. 

## 2013-03-05 NOTE — ED Provider Notes (Signed)
  Physical Exam  BP 118/78  Pulse 67  Temp(Src) 97.6 F (36.4 C) (Oral)  Resp 20  SpO2 98%  Physical Exam  ED Course  Procedures  MDM Accepted by Dr. Dub Mikes to be tx to [facility]      Vida Roller, MD 03/05/13 1818

## 2013-03-05 NOTE — ED Provider Notes (Signed)
Assuming care of patient this morning. Patient in the ED for SI and polysubstance abuse - requesting detox. Awaiting psych evaluation. Workup thus far is negative. Patient had no complains, no concerns from the nursing side. Will continue to monitor.  Filed Vitals:   03/05/13 0450  BP: 113/76  Pulse: 73  Temp: 98 F (36.7 C)  Resp: 18      Derwood Kaplan, MD 03/05/13 [phone]

## 2013-03-05 NOTE — Progress Notes (Signed)
Patient accepted by Dr. Lucianne Muss to Dr. Dub Mikes, bed 304-1.   Marland KitchenCatha Gosselin, [place]  [phone] 03/05/2013 1714pm

## 2013-03-06 ENCOUNTER — Encounter (HOSPITAL_COMMUNITY): Payer: Self-pay | Admitting: Psychiatry

## 2013-03-06 DIAGNOSIS — F1010 Alcohol abuse, uncomplicated: Secondary | ICD-10-CM

## 2013-03-06 DIAGNOSIS — F1310 Sedative, hypnotic or anxiolytic abuse, uncomplicated: Secondary | ICD-10-CM

## 2013-03-06 MED ORDER — MIRTAZAPINE 30 MG PO TABS
30.0000 mg | ORAL_TABLET | Freq: Every day | ORAL | Status: DC
  Administered 2013-03-06 – 2013-03-15 (×9): 30 mg via ORAL
  Filled 2013-03-06 (×7): qty 1
  Filled 2013-03-06: qty 3
  Filled 2013-03-06 (×5): qty 1

## 2013-03-06 NOTE — Clinical Social Work Note (Signed)
Writer attempted to meet with patient in the morning and afternoon to complete PSA.  Patient was very uncooperative in the morning stating if he were unable to discharge he had nothing to say.  Writer tried to meet with patient just prior to this note and he was sleeping and was unable to awaken.  PSA not completed.

## 2013-03-06 NOTE — Progress Notes (Signed)
D: Patient's affect/mood is blunted, depressed and irritable. Declined self inventory sheet today. Writer observed patient lying in bed the majority of the day. Patient compliant with medication regimen.  A: Support and encouragement provided to patient. Scheduled medications administered per MD orders. Maintain Q15 minute checks for safety.   R: Patient receptive. Denies SI/HI/AVH. Patient remains safe.
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
## 2013-03-06 NOTE — Progress Notes (Signed)
Patient ID: Curtis Blankenship, male   DOB: 02/01/1981, 32 y.o.   MRN: [id number] PER STATE REGULATIONS 482.30  THIS CHART WAS REVIEWED FOR MEDICAL NECESSITY WITH RESPECT TO THE PATIENT'S ADMISSION/ DURATION OF STAY.  NEXT REVIEW DATE:  03/09/2013 Willa Rough, RN, BSN CASE MANAGER

## 2013-03-06 NOTE — [facility] Suicide Risk Assessment (Signed)
Suicide Risk Assessment  Admission Assessment     Nursing information obtained from:  Patient Demographic factors:  Male;Caucasian;Low socioeconomic status;Unemployed Current Mental Status:  Self-harm thoughts Loss Factors:  NA Historical Factors:  Family history of mental illness or substance abuse Risk Reduction Factors:  Living with another person, especially a relative  CLINICAL FACTORS:   Severe Anxiety and/or Agitation Postpartum Depression  COGNITIVE FEATURES THAT CONTRIBUTE TO RISK:  Closed-mindedness Polarized thinking Thought constriction (tunnel vision)    SUICIDE RISK:   Moderate:  Frequent suicidal ideation with limited intensity, and duration, some specificity in terms of plans, no associated intent, good self-control, limited dysphoria/symptomatology, some risk factors present, and identifiable protective factors, including available and accessible social support.  PLAN OF CARE: Supportive approach/coping skills/relapse prevention                               Librium Detox protocol                               Reassess co morbidites  I certify that inpatient services furnished can reasonably be expected to improve the patient's condition.  Arionne Iams A 03/06/2013, 4:37 PM

## 2013-03-06 NOTE — Progress Notes (Signed)
Writer tried ask pt how night went to assess for the CIWA, etc, Pt states "I FEEL LIKE NOT ANSWERING QUESTIONS ". Pt seems to have an attitude, and does not want to cooperate when it comes to asking questions.

## 2013-03-07 DIAGNOSIS — F1994 Other psychoactive substance use, unspecified with psychoactive substance-induced mood disorder: Secondary | ICD-10-CM

## 2013-03-07 DIAGNOSIS — F1910 Other psychoactive substance abuse, uncomplicated: Secondary | ICD-10-CM

## 2013-03-07 DIAGNOSIS — G969 Disorder of central nervous system, unspecified: Secondary | ICD-10-CM | POA: Diagnosis present

## 2013-03-07 MED ORDER — CARBAMAZEPINE 200 MG PO TABS
200.0000 mg | ORAL_TABLET | Freq: Three times a day (TID) | ORAL | Status: DC
  Administered 2013-03-07 – 2013-03-08 (×3): 200 mg via ORAL
  Filled 2013-03-07 (×6): qty 1

## 2013-03-07 NOTE — Progress Notes (Signed)
D.  Pt resting in bed, remains irritable, did take medication as ordered.  Pt did not attend evening group, staying in bed at this time.  Did not speak to this staff member, very minimal interaction.  A.  Support and encouragement offered R.  Will continue to monitor.
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
## 2013-03-07 NOTE — Progress Notes (Signed)
Adult Psychoeducational Group Note  Date:  03/07/2013 Time:  0900  Group Topic/Focus:  Recovery Goals:   The focus of this group is to identify appropriate goals for recovery and establish a plan to achieve them.  Participation Level:  Did Not Attend  Pt decided not to attend. Pt is very irritable/angry and resistant. Curtis Blankenship Shari Prows 03/07/2013, 10:43 AM

## 2013-03-07 NOTE — Progress Notes (Signed)
Patient ID: Curtis Blankenship, male   DOB: [birth year], 32 y.o.   MRN: [id number] D: Pt is asleep in bed this AM. Pt denies SI/HI and A/V hallucinations. Pt did not complete their self inventory worksheet and has been very agitated, especially in the AM. Pt is argumentative and arrogant and insulting to staff. He is visibly angry but has made no threats of harm. Pt forwards little to staff and is very defensive and guarded. However, his mood did improve somewhat this afternoon, although he has spent most of the day sleeping.   A: Encouraged pt to discuss feelings with staff and administered medication per MD orders. Writer also encouraged pt to participate in groups.  R: Pt is not attending groups but tolerating medications well. Writer will continue to monitor. 15 minute checks are ongoing for safety.

## 2013-03-07 NOTE — [facility] Group Notes (Signed)
[facility] Group Notes: (Clinical Social Work)   03/07/2013      Type of Therapy:  Group Therapy   Participation Level:  Did Not Attend    Ambrose Mantle, LCSW 03/07/2013, 11:19 AM

## 2013-03-07 NOTE — Progress Notes (Signed)
Psychoeducational Group Note  Date:  03/07/2013 Time:  0945 am  Group Topic/Focus:  Identifying Needs:   The focus of this group is to help patients identify their personal needs that have been historically problematic and identify healthy behaviors to address their needs.  Participation Level:  Did Not Attend  [name] 03/07/2013,3:25 PM

## 2013-03-07 NOTE — Progress Notes (Signed)
Patient ID: Curtis Blankenship, male   DOB: [birth year], 31 y.o.   MRN: [id number] D)  Has spent the evening in bed, tired, irritable, states doesn't want to be around people tonight.  Didn't come out to the med window, missed group, has been sleeping.  Meds were taken to him, fluids encouraged.   A)  Will continue to monitor for safety, continue POC, and try to establish therapeutic rapport, support R)  Remains safe on unit

## 2013-03-08 MED ORDER — CARBAMAZEPINE 200 MG PO TABS
200.0000 mg | ORAL_TABLET | Freq: Three times a day (TID) | ORAL | Status: DC
  Filled 2013-03-08 (×4): qty 1

## 2013-03-08 MED ORDER — DESVENLAFAXINE SUCCINATE ER 50 MG PO TB24
50.0000 mg | ORAL_TABLET | Freq: Every day | ORAL | Status: DC
  Administered 2013-03-08 – 2013-03-16 (×9): 50 mg via ORAL
  Filled 2013-03-08: qty 3
  Filled 2013-03-08 (×10): qty 1

## 2013-03-08 MED ORDER — ZOLPIDEM TARTRATE 10 MG PO TABS
10.0000 mg | ORAL_TABLET | Freq: Every evening | ORAL | Status: DC | PRN
  Administered 2013-03-13 – 2013-03-15 (×3): 10 mg via ORAL
  Filled 2013-03-08 (×3): qty 1

## 2013-03-08 MED ORDER — CARBAMAZEPINE 200 MG PO TABS
200.0000 mg | ORAL_TABLET | Freq: Four times a day (QID) | ORAL | Status: DC
  Administered 2013-03-08 – 2013-03-16 (×28): 200 mg via ORAL
  Filled 2013-03-08 (×9): qty 1
  Filled 2013-03-08: qty 12
  Filled 2013-03-08 (×14): qty 1
  Filled 2013-03-08: qty 12
  Filled 2013-03-08: qty 1
  Filled 2013-03-08: qty 12
  Filled 2013-03-08 (×6): qty 1
  Filled 2013-03-08: qty 12
  Filled 2013-03-08 (×4): qty 1

## 2013-03-08 MED ORDER — HYDROXYZINE HCL 50 MG PO TABS: 100.0000 mg | ORAL_TABLET | Freq: Four times a day (QID) | ORAL | Status: AC | PRN

## 2013-03-08 MED ORDER — CARBAMAZEPINE 200 MG PO TABS
200.0000 mg | ORAL_TABLET | Freq: Three times a day (TID) | ORAL | Status: AC
Start: 2013-03-08 — End: 2013-03-08
  Administered 2013-03-08 (×2): 200 mg via ORAL
  Filled 2013-03-08 (×2): qty 1

## 2013-03-08 NOTE — Progress Notes (Signed)
[facility] MD Progress Note  03/08/2013 9:38 AM Curtis Blankenship  MRN:  [id number] Subjective:  Pt noted no side effects or benefit from the Tegretol.  Will get level in AM and order QID schedule of 200 mg for him.  He has used Vistaril at 150 mg PRN, will try 100  For now.  He had been on Ambien in the past will renew.  He was in the bed today when I stood at the window of his room to discuss his plight today.   Diagnosis:   Axis I: Substance Abuse, Substance Induced Mood Disorder and rule out CNS disorder  Axis II: Deferred Axis III:  Past Medical History  Diagnosis Date  . Bipolar disorder   . PTSD (post-traumatic stress disorder)   . Depression    Axis IV: other psychosocial or environmental problems Axis V: 41-50 serious symptoms  ADL's:  Impaired  Sleep: Poor  Appetite:  Poor  Suicidal Ideation:  Denies Homicidal Ideation: Denies AEB (as evidenced by): per pt report  Psychiatric Specialty Exam: ROS  Blood pressure 130/91, pulse 85, temperature 98.4 F (36.9 C), temperature source Oral, resp. rate 18, height 5\' 9"  (1.753 m), weight 76.204 kg (168 lb).Body mass index is 24.8 kg/(m^2).  General Appearance: Disheveled and hospital gown  Eye Contact::  Poor  Speech:  Clear and Coherent  Volume:  Normal  Mood:  Hopeless, Irritable and Worthless  Affect:  Congruent  Thought Process:  Intact  Orientation:  Full (Time, Place, and Person)  Thought Content:  WDL  Suicidal Thoughts:  Yes.  without intent/plan  Homicidal Thoughts:  No  Memory:  Immediate;   Fair  Judgement:  Poor  Insight:  Lacking  Psychomotor Activity:  Normal  Concentration:  Poor  Recall:  Poor  Akathisia:  No  Handed:  Right  AIMS (if indicated):     Assets:  Communication Skills  Sleep:  Number of Hours: 6.25   Current Medications: Current Facility-Administered Medications  Medication Dose Route Frequency Provider Last Rate Last Dose  . acetaminophen (TYLENOL) tablet 650 mg  650 mg Oral Q6H PRN  Verne Spurr, PA-C      . alum & mag hydroxide-simeth (MAALOX/MYLANTA) 200-200-20 MG/5ML suspension 30 mL  30 mL Oral Q4H PRN Verne Spurr, PA-C      . carbamazepine (TEGRETOL) tablet 200 mg  200 mg Oral TID Mike Craze, MD   200 mg at 03/08/13 6213  . chlordiazePOXIDE (LIBRIUM) capsule 25 mg  25 mg Oral Q6H PRN Verne Spurr, PA-C   25 mg at 03/07/13 2142  . chlordiazePOXIDE (LIBRIUM) capsule 25 mg  25 mg Oral TID Verne Spurr, PA-C   25 mg at 03/08/13 0865   Followed by  . chlordiazePOXIDE (LIBRIUM) capsule 25 mg  25 mg Oral BH-qamhs Verne Spurr, PA-C       Followed by  . [START ON 03/10/2013] chlordiazePOXIDE (LIBRIUM) capsule 25 mg  25 mg Oral Daily Verne Spurr, PA-C      . desvenlafaxine (PRISTIQ) 24 hr tablet 50 mg  50 mg Oral Daily Mike Craze, MD      . hydrOXYzine (ATARAX/VISTARIL) tablet 100 mg  100 mg Oral Q6H PRN Mike Craze, MD      . loperamide (IMODIUM) capsule 2-4 mg  2-4 mg Oral PRN Verne Spurr, PA-C      . magnesium hydroxide (MILK OF MAGNESIA) suspension 30 mL  30 mL Oral Daily PRN Verne Spurr, PA-C      . mirtazapine (REMERON)  tablet 30 mg  30 mg Oral QHS Rachael Fee, MD   30 mg at 03/07/13 2142  . multivitamin with minerals tablet 1 tablet  1 tablet Oral Daily Verne Spurr, PA-C   1 tablet at 03/08/13 1610  . nicotine (NICODERM CQ - dosed in mg/24 hours) patch 21 mg  21 mg Transdermal Q0600 Verne Spurr, PA-C   21 mg at 03/08/13 0644  . ondansetron (ZOFRAN-ODT) disintegrating tablet 4 mg  4 mg Oral Q6H PRN Verne Spurr, PA-C      . thiamine (B-1) injection 100 mg  100 mg Intramuscular Once [employer], PA-C      . thiamine (VITAMIN B-1) tablet 100 mg  100 mg Oral Daily Verne Spurr, PA-C   100 mg at 03/08/13 9604  . traZODone (DESYREL) tablet 50 mg  50 mg Oral QHS PRN Verne Spurr, PA-C   50 mg at 03/07/13 2142  . zolpidem (AMBIEN) tablet 10 mg  10 mg Oral QHS PRN Mike Craze, MD        Lab Results: No results found for this or any previous  visit (from the past 48 hour(s)).  Physical Findings: AIMS: Facial and Oral Movements Muscles of Facial Expression: None, normal Lips and Perioral Area: None, normal Jaw: None, normal Tongue: None, normal,Extremity Movements Upper (arms, wrists, hands, fingers): None, normal Lower (legs, knees, ankles, toes): None, normal, Trunk Movements Neck, shoulders, hips: None, normal, Overall Severity Severity of abnormal movements (highest score from questions above): None, normal Incapacitation due to abnormal movements: None, normal Patient's awareness of abnormal movements (rate only patient's report): No Awareness, Dental Status Current problems with teeth and/or dentures?: No Does patient usually wear dentures?: No  CIWA:  CIWA-Ar Total: 7 COWS:     Treatment Plan Summary: Daily contact with patient to assess and evaluate symptoms and progress in treatment Medication management  Plan: Get Teg level, significantly increase Vistaril and add back Ambien for sleep Medical Decision Making Problem Points:  New problem, with no additional work-up planned (3), Review of last therapy session (1) and Review of psycho-social stressors (1) Data Points:  Review of medication regiment & side effects (2) Review of new medications or change in dosage (2)  I certify that inpatient services furnished can reasonably be expected to improve the patient's condition.   Yaeli Hartung 03/08/2013, 9:38 AM

## 2013-03-08 NOTE — Progress Notes (Signed)
Psychoeducational Group Note  Date:  03/08/2013 Time:  2100 Group Topic/Focus:  wrap up group  Participation Level: Did Not Attend  Participation Quality:  Not Applicable  Affect:  Not Applicable  Cognitive:  Not Applicable  Insight:  Not Applicable  Engagement in Group: Not Applicable  Additional Comments:  Pt remained in bed asleep.  Shelah Lewandowsky 03/08/2013, 11:59 PM

## 2013-03-08 NOTE — Progress Notes (Addendum)
Patient ID: Curtis Blankenship, male   DOB: 1981-04-08, 31 y.o.   MRN: [id number] D: Pt is awake and active on the unit this AM. Pt endorses passive SI but is able to contract for safety. Pt rates their depression at 7 and hopelessness at 7. Pt's most recent CIWA score was 8. Pt remains resistant to treatment and has spent most of the day in bed. Pt is less agitated but is not attending groups. Pt forwards little. His mood is irritable and his affect is angry/apathetic.  A: Encouraged pt to discuss feelings with staff and administered medication per MD orders. Writer also encouraged pt to participate in groups.  R: Pt is attending groups and tolerating medications well. Writer will continue to monitor. 15 minute checks are ongoing for safety.

## 2013-03-08 NOTE — Progress Notes (Signed)
Psychoeducational Group Note  Date:  03/07/2013 Time: 2100  Group Topic/Focus:  Wrap-Up Group    Participation Level: Did Not Attend  Participation Quality:  Not Applicable  Affect:  Not Applicable  Cognitive:  Not Applicable  Insight:  Not Applicable  Engagement in Group: Not Applicable  Additional Comments:  Pt remained in bed asleep.  Shelah Lewandowsky 03/08/2013, 2:14 AM

## 2013-03-08 NOTE — B[facility] Counselor (Signed)
Adult Psychosocial Assessment Update Interdisciplinary Team  Previous [facility] admissions/discharges:  Admissions Discharges  Date:  12/01/12 Date:  12/08/12  Date: Date:  Date: Date:  Date: Date:  Date: Date:   Changes since the last Psychosocial Assessment (including adherence to outpatient mental health and/or substance abuse treatment, situational issues contributing to decompensation and/or relapse). States he "did good" at first when he went home, but ran out of medications.  He would have had to pay out of pocket with his insurance, and did not have the money.  He realizes [facility] [place] to get [facility] scripts for free, but has a hard time getting a ride.  Once he did not have the medications, he became depressed and started using again.  He was sober approximately 2 months.  He did very well on the Antabuse and Pristiq.  Has taken 6 months to get a dental appointment and does not want to miss it on 03/19/13.  Would live with his sister as long as he had his medications until the dental appointment and then go to rehab.           Discharge Plan 1. Will you be returning to the same living situation after discharge?   Yes:   No:  XX    If no, what is your plan?  Wants to go to 2-year program at [facility] (not [facility])    He believes that the 2-year program he want to go to in [facility] requires a 30-day intensive inpatient program first, and he wants to go to the [facility] In [place] Darel Hong is the contact),       2. Would you like a referral for services when you are discharged? Yes:  X   If yes, for what services?  No:       Needs help getting into the [facility] program in [place] Darel Hong is the contact).       Summary and Recommendations (to be completed by the evaluator) This is a 32yo Caucasian male who was admitted requesting detox.  He was uncooperative at the time of admission, refusing to answer questions other than to  state that he has been on a 5 day binge using cocaine, benzo's, and alcohol. Pt reports taking (xanax, cocaine, and three 40 oz beers in the 8 hours prior to going to ED).  At admission, he also had reports of SI's through a couple different scenarios.  He was living with his sister but does not want to return there, except to go to a [place] dental appointment on 03/19/13.  Then he wants to go to the [facility] program (contact person Darel Hong) for 30 days, followed by a 2-year program in [facility].  He would benefit from safety monitoring, medication evaluation, psychoeducation, group therapy, and discharge planning to link with ongoing resources.                         Signature:  Sarina Ser, 03/08/2013 10:47 AM

## 2013-03-08 NOTE — Progress Notes (Signed)
Adult Psychoeducational Group Note  Date:  03/08/2013 Time:  0900  Group Topic/Focus:  Relapse Prevention Planning:   The focus of this group is to define relapse and discuss the need for planning to combat relapse.  Participation Level:  Did Not Attend  Pt refused to attend group  Edmund Holcomb Shari Prows 03/08/2013, 10:30 AM

## 2013-03-09 LAB — CARBAMAZEPINE LEVEL, TOTAL: Carbamazepine Lvl: 9.6 ug/mL (ref 4.0–12.0)

## 2013-03-09 MED ORDER — HYDROXYZINE HCL 50 MG PO TABS
50.0000 mg | ORAL_TABLET | ORAL | Status: DC | PRN
  Administered 2013-03-09 – 2013-03-13 (×5): 50 mg via ORAL
  Filled 2013-03-09 (×3): qty 1

## 2013-03-09 NOTE — Progress Notes (Signed)
Patient ID: Curtis Blankenship, male   DOB: 1981/09/09, 32 y.o.   MRN: [id number] D)  Resting quietly, eyes closed, resp reg, unlabored, no c/o's voiced. A)  Will continue to monitor for safety, POC R)  Safety maintained.

## 2013-03-09 NOTE — [facility] Group Notes (Signed)
[facility] LCSW Group Therapy  03/09/2013 1:15 PM  Type of Therapy:  Group Therapy - Topic was Overcoming Obstacles  Participation Level:  Did Not Attend  Curtis Blankenship 03/09/2013, 1:59 PM

## 2013-03-09 NOTE — Progress Notes (Signed)
[facility] MD Progress Note  03/09/2013 3:22 PM Curtis Blankenship  MRN:  [id number] Subjective:  States he feels that his life is out of control. He claims he was doing reasonably well, going to meetings staying with his sister. States that people who were giving him rides to the meetings started not showing up or calling him telling him they could not. He admits he got the "f--- It" attitude. Relapsed. Gaylyn Rong has not been able to get himself together. He feels full of shame. His mood is very unstable.Has not communicated with his mother. Feels completely hopeless, helpless. States he feels he wants to die. States he gets very irritated, agitated with people and admits that the anger is towards himself.  Diagnosis:  PTSD, Mood Disorder NOS, Alcohol Dependence, Cocaine Abuse  ADL's:  Intact  Sleep: Poor  Appetite:  Fair  Suicidal Ideation:  Plan:  denies Intent:  denies Means:  denies Homicidal Ideation:  Plan:  denies Intent:  denies Means:  denies AEB (as evidenced by):  Psychiatric Specialty Exam: Review of Systems  Constitutional: Negative.   HENT: Negative.   Eyes: Negative.   Respiratory: Negative.   Cardiovascular: Negative.   Gastrointestinal: Negative.   Genitourinary: Negative.   Musculoskeletal: Negative.   Skin: Negative.   Neurological: Negative.   Endo/Heme/Allergies: Negative.   Psychiatric/Behavioral: Positive for depression, suicidal ideas and substance abuse. The patient is nervous/anxious.     Blood pressure 133/89, pulse 73, temperature 98.4 F (36.9 C), temperature source Oral, resp. rate 18, height 5\' 9"  (1.753 m), weight 76.204 kg (168 lb).Body mass index is 24.8 kg/(m^2).  General Appearance: Disheveled  Eye Solicitor::  Fair  Speech:  Clear and Coherent and not spontaneous  Volume:  fluctuates  Mood:  Anxious, Depressed, Dysphoric and Irritable  Affect:  Labile  Thought Process:  Coherent and Goal Directed  Orientation:  Full (Time, Place, and Person)  Thought  Content:  worries, concerns, poose self esteem fear of lsoing control, helplessness, hopelessness  Suicidal Thoughts:  Yes.  without intent/plan  Homicidal Thoughts:  No  Memory:  Immediate;   Fair Recent;   Fair Remote;   Fair  Judgement:  Fair  Insight:  Present  Psychomotor Activity:  Restlessness  Concentration:  Fair  Recall:  Fair  Akathisia:  No  Handed:  Right  AIMS (if indicated):     Assets:  Financial Resources/Insurance Housing  Sleep:  Number of Hours: 4.25   Current Medications: Current Facility-Administered Medications  Medication Dose Route Frequency Provider Last Rate Last Dose  . acetaminophen (TYLENOL) tablet 650 mg  650 mg Oral Q6H PRN Verne Spurr, PA-C      . alum & mag hydroxide-simeth (MAALOX/MYLANTA) 200-200-20 MG/5ML suspension 30 mL  30 mL Oral Q4H PRN Verne Spurr, PA-C      . carbamazepine (TEGRETOL) tablet 200 mg  200 mg Oral QID Mike Craze, MD   200 mg at 03/09/13 1104  . [START ON 03/10/2013] chlordiazePOXIDE (LIBRIUM) capsule 25 mg  25 mg Oral Daily Verne Spurr, PA-C      . desvenlafaxine (PRISTIQ) 24 hr tablet 50 mg  50 mg Oral Daily Mike Craze, MD   50 mg at 03/09/13 0836  . hydrOXYzine (ATARAX/VISTARIL) tablet 50 mg  50 mg Oral Q4H PRN Rachael Fee, MD   50 mg at 03/09/13 1105  . magnesium hydroxide (MILK OF MAGNESIA) suspension 30 mL  30 mL Oral Daily PRN Verne Spurr, PA-C      . mirtazapine (REMERON)  tablet 30 mg  30 mg Oral QHS Rachael Fee, MD   30 mg at 03/08/13 2110  . multivitamin with minerals tablet 1 tablet  1 tablet Oral Daily Verne Spurr, PA-C   1 tablet at 03/09/13 0836  . nicotine (NICODERM CQ - dosed in mg/24 hours) patch 21 mg  21 mg Transdermal Q0600 Verne Spurr, PA-C   21 mg at 03/09/13 4034  . thiamine (B-1) injection 100 mg  100 mg Intramuscular Once [employer], PA-C      . thiamine (VITAMIN B-1) tablet 100 mg  100 mg Oral Daily Verne Spurr, PA-C   100 mg at 03/09/13 0836  . traZODone (DESYREL) tablet 50  mg  50 mg Oral QHS PRN Verne Spurr, PA-C   50 mg at 03/07/13 2142  . zolpidem (AMBIEN) tablet 10 mg  10 mg Oral QHS PRN Mike Craze, MD        Lab Results:  Results for orders placed during the hospital encounter of 03/05/13 (from the past 48 hour(s))  CARBAMAZEPINE LEVEL, TOTAL     Status: None   Collection Time    03/09/13  6:30 AM      Result Value Range   Carbamazepine Lvl 9.6  4.0 - 12.0 ug/mL    Physical Findings: AIMS: Facial and Oral Movements Muscles of Facial Expression: None, normal Lips and Perioral Area: None, normal Jaw: None, normal Tongue: None, normal,Extremity Movements Upper (arms, wrists, hands, fingers): None, normal Lower (legs, knees, ankles, toes): None, normal, Trunk Movements Neck, shoulders, hips: None, normal, Overall Severity Severity of abnormal movements (highest score from questions above): None, normal Incapacitation due to abnormal movements: None, normal Patient's awareness of abnormal movements (rate only patient's report): No Awareness, Dental Status Current problems with teeth and/or dentures?: No Does patient usually wear dentures?: No  CIWA:  CIWA-Ar Total: 7 COWS:     Treatment Plan Summary: Daily contact with patient to assess and evaluate symptoms and progress in treatment Medication management  Plan: Supportive approach/coping skills/relapse prevention           Anger management           Complete the detox           Optimize treatment with the Tegretol (hx of TBI) Would like to look into longer term programs. States that staying somewhere 30 days is not going to make it for him Medical Decision Making Problem Points:  Review of last therapy session (1) and Review of psycho-social stressors (1) Data Points:  Review of medication regiment & side effects (2) Review of new medications or change in dosage (2)  I certify that inpatient services furnished can reasonably be expected to improve the patient's condition.    Dorothye Berni A 03/09/2013, 3:22 PM

## 2013-03-09 NOTE — Tx Team (Signed)
Interdisciplinary Treatment Plan Update (Adult)  Date: 03/09/2013  Time Reviewed:  9:45 AM  Progress in Treatment: Attending groups: Yes Participating in groups:  Yes Taking medication as prescribed:  Yes Tolerating medication:  Yes Family/Significant othe contact made: No Patient understands diagnosis:  Yes Discussing patient identified problems/goals with staff:  Yes Medical problems stabilized or resolved:  Yes Denies suicidal/homicidal ideation: Yes Issues/concerns per patient self-inventory:  Yes Other:  New problem(s) identified: N/A  Discharge Plan or Barriers: CSW assessing for appropriate follow up for pt.   Reason for Continuation of Hospitalization: Anxiety Depression Medication Stabilization  Comments: N/A  Estimated length of stay: 2-3 days  For review of initial/current patient goals, please see plan of care.  Attendees: Patient:     Family:     Physician:  Dr. Dub Mikes 03/09/2013 1:56 PM   Nursing:   Liborio Nixon, RN 03/09/2013 1:56 PM   Clinical Social Worker:  Reyes Ivan, LCSWA 03/09/2013 1:56 PM   Other: Robbie Louis, RN 03/09/2013 1:57 PM   Other:     Other:     Other:     Other:    Other:    Other:    Other:    Other:    Other:     Scribe for Treatment Team:   Carmina Miller, 03/09/2013 1:57 PM

## 2013-03-09 NOTE — Progress Notes (Signed)
Patient ID: Curtis Blankenship, male   DOB: 09-03-1981, 32 y.o.   MRN: [id number] PER STATE REGULATIONS 482.30  THIS CHART WAS REVIEWED FOR MEDICAL NECESSITY WITH RESPECT TO THE PATIENT'S ADMISSION/ DURATION OF STAY.  NEXT REVIEW DATE: 03/12/2013  Willa Rough, RN, BSN CASE MANAGER

## 2013-03-09 NOTE — [facility] Group Notes (Signed)
[facility] LCSW Aftercare Discharge Planning Group Note   03/09/2013 8:45 AM   Participation Quality: Did Not Attend  [name], LCSWA  03/09/2013  9:20 AM    

## 2013-03-09 NOTE — Progress Notes (Signed)
Adult Psychoeducational Group Note  Date:  03/09/2013 Time:  11:46 AM  Group Topic/Focus:  Self Care:   The focus of this group is to help patients understand the importance of self-care in order to improve or restore emotional, physical, spiritual, interpersonal, and financial health.  Participation Level:  Did Not Attend   Additional Comments:  Pt. Did not attend group   Meredith Staggers 03/09/2013, 11:46 AM

## 2013-03-10 NOTE — [facility] Group Notes (Signed)
[facility] LCSW Aftercare Discharge Planning Group Note   03/10/2013 8:45  AM  Participation Quality:  Did not Attend   [name]

## 2013-03-10 NOTE — Progress Notes (Signed)
[facility] MD Progress Note  03/10/2013 5:00 PM Curtis Blankenship  MRN:  [id number] Subjective:  Very anxious early today. Was involved in a conflictive interaction with another peer that almost escalated into a physical confrontation. He states that any confrontation of this sort escalates his PTSD symptoms. Feels uneasy, afraid of how he is going to react. Admits to fear of losing control. He was initially wanting to be D/C but later was able to settle down and agree that it was not safe for him to be discharged. He has not hear from his family what has increase his depression, his negative self talk with exacerbation of self harm ruminations Diagnosis:  PTSD, Alcohol Dependence, Cocaine Abuse  ADL's:  Intact  Sleep: Poor  Appetite:  Fair  Suicidal Ideation:  Plan:  denies Intent:  denies Means:  denies Homicidal Ideation:  Plan:  denies Intent:  denies Means:  denies AEB (as evidenced by):  Psychiatric Specialty Exam: Review of Systems  Constitutional: Negative.   HENT: Negative.   Eyes: Negative.   Respiratory: Negative.   Cardiovascular: Negative.   Gastrointestinal: Negative.   Genitourinary: Negative.   Musculoskeletal: Negative.   Skin: Negative.   Neurological: Negative.   Endo/Heme/Allergies: Negative.   Psychiatric/Behavioral: Positive for depression and substance abuse. The patient is nervous/anxious and has insomnia.     Blood pressure 127/85, pulse 71, temperature 98 F (36.7 C), temperature source Oral, resp. rate 20, height 5\' 9"  (1.753 m), weight 76.204 kg (168 lb).Body mass index is 24.8 kg/(m^2).  General Appearance: Disheveled  Eye Solicitor::  Fair  Speech:  Clear and Coherent  Volume:  fluctuates  Mood:  Anxious, Depressed, Dysphoric and Irritable  Affect:  anxious, upset, worried  Thought Process:  Coherent and Goal Directed  Orientation:  Full (Time, Place, and Person)  Thought Content:  Rumination, worries, concerns, fear of losing control  Suicidal  Thoughts:  Yes.  without intent/plan  Homicidal Thoughts:  No  Memory:  Immediate;   Fair Recent;   Fair Remote;   Fair  Judgement:  Fair  Insight:  Present  Psychomotor Activity:  Restlessness  Concentration:  Fair  Recall:  Fair  Akathisia:  No  Handed:  Right  AIMS (if indicated):     Assets:  Desire for Improvement  Sleep:  Number of Hours: 6.5   Current Medications: Current Facility-Administered Medications  Medication Dose Route Frequency Provider Last Rate Last Dose  . acetaminophen (TYLENOL) tablet 650 mg  650 mg Oral Q6H PRN Verne Spurr, PA-C      . alum & mag hydroxide-simeth (MAALOX/MYLANTA) 200-200-20 MG/5ML suspension 30 mL  30 mL Oral Q4H PRN Verne Spurr, PA-C      . carbamazepine (TEGRETOL) tablet 200 mg  200 mg Oral QID Mike Craze, MD   200 mg at 03/10/13 1158  . desvenlafaxine (PRISTIQ) 24 hr tablet 50 mg  50 mg Oral Daily Mike Craze, MD   50 mg at 03/10/13 2440  . hydrOXYzine (ATARAX/VISTARIL) tablet 50 mg  50 mg Oral Q4H PRN Rachael Fee, MD   50 mg at 03/10/13 1027  . magnesium hydroxide (MILK OF MAGNESIA) suspension 30 mL  30 mL Oral Daily PRN Verne Spurr, PA-C      . mirtazapine (REMERON) tablet 30 mg  30 mg Oral QHS Rachael Fee, MD   30 mg at 03/09/13 2214  . multivitamin with minerals tablet 1 tablet  1 tablet Oral Daily Verne Spurr, PA-C   1 tablet at 03/10/13  4098  . nicotine (NICODERM CQ - dosed in mg/24 hours) patch 21 mg  21 mg Transdermal Q0600 Verne Spurr, PA-C   21 mg at 03/10/13 1191  . thiamine (B-1) injection 100 mg  100 mg Intramuscular Once [employer], PA-C      . thiamine (VITAMIN B-1) tablet 100 mg  100 mg Oral Daily Verne Spurr, PA-C   100 mg at 03/10/13 4782  . traZODone (DESYREL) tablet 50 mg  50 mg Oral QHS PRN Verne Spurr, PA-C   50 mg at 03/07/13 2142  . zolpidem (AMBIEN) tablet 10 mg  10 mg Oral QHS PRN Mike Craze, MD        Lab Results:  Results for orders placed during the hospital encounter of 03/05/13  (from the past 48 hour(s))  CARBAMAZEPINE LEVEL, TOTAL     Status: None   Collection Time    03/09/13  6:30 AM      Result Value Range   Carbamazepine Lvl 9.6  4.0 - 12.0 ug/mL    Physical Findings: AIMS: Facial and Oral Movements Muscles of Facial Expression: None, normal Lips and Perioral Area: None, normal Jaw: None, normal Tongue: None, normal,Extremity Movements Upper (arms, wrists, hands, fingers): None, normal Lower (legs, knees, ankles, toes): None, normal, Trunk Movements Neck, shoulders, hips: None, normal, Overall Severity Severity of abnormal movements (highest score from questions above): None, normal Incapacitation due to abnormal movements: None, normal Patient's awareness of abnormal movements (rate only patient's report): No Awareness, Dental Status Current problems with teeth and/or dentures?: No Does patient usually wear dentures?: No  CIWA:  CIWA-Ar Total: 1 COWS:  COWS Total Score: 0  Treatment Plan Summary: Daily contact with patient to assess and evaluate symptoms and progress in treatment Medication management  Plan: Supportive approach/stress management/coping skills/relapse prevention            Tegretol level            Optimize treatment with psychotropics  Medical Decision Making Problem Points:  Review of last therapy session (1) and Review of psycho-social stressors (1) Data Points:  Review of medication regiment & side effects (2)  I certify that inpatient services furnished can reasonably be expected to improve the patient's condition.   Ninoska Goswick A 03/10/2013, 5:00 PM

## 2013-03-10 NOTE — [facility] Group Notes (Signed)
[facility] LCSW Group Therapy  03/10/2013  1:15 PM  Type of Therapy:  Group Therapy 1:15 to 2:30 PM  Participation Level:  Did Not Attend  [name]

## 2013-03-10 NOTE — ED Provider Notes (Signed)
Medical screening examination/treatment/procedure(s) were performed by non-physician practitioner and as supervising physician I was immediately available for consultation/collaboration.  Raeford Razor, MD 03/10/13 1444

## 2013-03-10 NOTE — Progress Notes (Signed)
Pt still isolates to his room.  Says he can't be around one of his peers.  He is easily angered, and says being around that person agitates him, so he has not attended any groups.  He denies SI/HI/AV.  He reports no withdrawal symptoms at this time.  He was moved to another room this morning and is getting along with his new roommate very well.  He is unsure when he will be discharged from [facility].  He wants to go to a treatment center in [place], but that has not been arranged yet.  He makes his needs known to staff.  Support and encouragement offered.  Safety maintained with q15 minute checks.

## 2013-03-10 NOTE — Progress Notes (Signed)
Patient ID: Curtis Blankenship, male   DOB: December 16, 1980, 32 y.o.   MRN: [id number] Patient reports he is not attending groups on unit due to conflict with another patient.   [facility] [place]. CSW made several attempts to reach the counselor patient spoke of without success but was able to leave message at Phone: [phone].  If no call back CSW will send fax tomorrow to  Fax: [phone] [name], LCSWA

## 2013-03-10 NOTE — Progress Notes (Signed)
D:  Patient has secluded to his room most of the day.  He has been walking some and doing a lot of reading.  States he is not able to attend groups because he cannot be around people.  Refused to fill out his self inventory today.  Asked why it needed to be done and still declined.   A:  Educated patient about the need to have the self inventories as a way to gauge patients perception of their own progress.  Encouraged him to rethink his decision.  Medications given per orders.  Given hydroxyzine 50 mg once for complaints of anxiety. R:  Mostly pleasant with staff, but irritable at times.  Is not interacting with peers or attending groups.  Hydroxyzine effective in decreasing anxiety.

## 2013-03-10 NOTE — Progress Notes (Signed)
Adult Psychoeducational Group Note  Date:  03/10/2013 Time:  11:57 AM  Group Topic/Focus:  Recovery Goals:   The focus of this group is to identify appropriate goals for recovery and establish a plan to achieve them.  Participation Level:  Did Not Attend    Barth Kirks 03/10/2013, 11:57 AM

## 2013-03-10 NOTE — Progress Notes (Signed)
Recreation Therapy Notes  Date: 05.13.2014 Time: 3:00pm Location: [address] Dayroom  Group Topic/Focus: Problem Solving  Participation Level:  Did not attend  [employer], LRT/CTRS  Jearl Klinefelter 03/10/2013 5:29 PM

## 2013-03-10 NOTE — [facility] Group Notes (Signed)
Pt has been in bed since the beginning of the shift.  He did get up about 30 minutes ago for a snack.  He reports being tired and says he has spent most of the day in his room.  Pt contracts for safety.  He denies HI/AV.  His affect is irritable and he does not desire to participate in the activities on the hall.  He says his withdrawal symptoms are minimal.  Support and encouragement offered.  Pt makes his needs known to staff.  Safety maintained with q15 minute checks.

## 2013-03-11 DIAGNOSIS — F4310 Post-traumatic stress disorder, unspecified: Secondary | ICD-10-CM

## 2013-03-11 DIAGNOSIS — F1020 Alcohol dependence, uncomplicated: Principal | ICD-10-CM

## 2013-03-11 DIAGNOSIS — F39 Unspecified mood [affective] disorder: Secondary | ICD-10-CM

## 2013-03-11 DIAGNOSIS — F1410 Cocaine abuse, uncomplicated: Secondary | ICD-10-CM

## 2013-03-11 NOTE — [facility] Group Notes (Signed)
[facility] LCSW Group Therapy  03/11/2013   Type of Therapy:  Group Therapy  Participation Level:  Active  Participation Quality:  Attentive and Sharing  Affect:  Anxious and Depressed  Cognitive:  Alert and Oriented  Insight:  Limited  Engagement in Therapy:  Developing/Improving  Modes of Intervention:  Clarification, Confrontation, Limit-setting, Rapport Building, Dance movement psychotherapist and Support  Summary of Progress/Problems: The focus of this group session was to process how we deal with difficult emotions and share with others the patterns that play out when we are reacting to the emotion verses the situation.  Curtis Blankenship shared that one of the most difficult emotions he deals with is boredom and he often uses to deal with boredom.  "I am not a happy person in recovery, I'm a dry drunk and miserable."  When challenged to consider the possibility that he may have a different experience this time patient had difficulty being open to the possibility.  Patient preferred to focus on past using experiences verses the future without substances.     [name]

## 2013-03-11 NOTE — [facility] Group Notes (Signed)
[facility] LCSW Aftercare Discharge Planning Group Note   03/11/2013 8:45 AM  Participation Quality:  Did Not Attend   Curtis Blankenship    

## 2013-03-11 NOTE — Progress Notes (Signed)
[facility] MD Progress Note  03/11/2013 12:43 PM Curtis Blankenship  MRN:  [id number] Subjective:  Curtis Blankenship continues to have a hard time. He is dealing with a lot of shame and quilt for alll that he has put his family through. He is still endorsing mood instability. Currently avoiding interactions with other patients as he does not want to go off. Worried as does not have a clear sense of what he needs to to when he gets out of here. Reports no sense of purpose. Still intermittent suicidal ruminations Diagnosis:  PTSD, Mood Disorder NOS, Alcohol Dependence, Cocaine Abuse  ADL's:  Intact  Sleep: Fair  Appetite:  Fair  Suicidal Ideation:  Plan:  denies Intent:  denies Means:  denies Homicidal Ideation:  Plan:  denies Intent:  denies Means:  denies AEB (as evidenced by):  Psychiatric Specialty Exam: Review of Systems  Constitutional: Negative.   HENT: Negative.   Eyes: Negative.   Respiratory: Negative.   Cardiovascular: Negative.   Gastrointestinal: Negative.   Genitourinary: Negative.   Musculoskeletal: Negative.   Skin: Negative.   Neurological: Negative.   Endo/Heme/Allergies: Negative.   Psychiatric/Behavioral: Positive for depression and substance abuse. The patient is nervous/anxious.     Blood pressure 142/93, pulse 93, temperature 98.3 F (36.8 C), temperature source Oral, resp. rate 18, height 5\' 9"  (1.753 m), weight 76.204 kg (168 lb).Body mass index is 24.8 kg/(m^2).  General Appearance: Disheveled  Eye Solicitor::  Fair  Speech:  Clear and Coherent, Slow and not spontaneous  Volume:  Decreased  Mood:  Anxious, Depressed and Irritable  Affect:  Restricted  Thought Process:  Coherent and Goal Directed  Orientation:  Full (Time, Place, and Person)  Thought Content:  Rumination  Suicidal Thoughts:  No  Homicidal Thoughts:  No  Memory:  Immediate;   Fair Recent;   Fair Remote;   Fair  Judgement:  Fair  Insight:  Present  Psychomotor Activity:  Restlessness   Concentration:  Fair  Recall:  Fair  Akathisia:  No  Handed:  Right  AIMS (if indicated):     Assets:  Desire for Improvement  Sleep:  Number of Hours: 6.5   Current Medications: Current Facility-Administered Medications  Medication Dose Route Frequency Provider Last Rate Last Dose  . acetaminophen (TYLENOL) tablet 650 mg  650 mg Oral Q6H PRN Verne Spurr, PA-C      . alum & mag hydroxide-simeth (MAALOX/MYLANTA) 200-200-20 MG/5ML suspension 30 mL  30 mL Oral Q4H PRN Verne Spurr, PA-C      . carbamazepine (TEGRETOL) tablet 200 mg  200 mg Oral QID Mike Craze, MD   200 mg at 03/11/13 1210  . desvenlafaxine (PRISTIQ) 24 hr tablet 50 mg  50 mg Oral Daily Mike Craze, MD   50 mg at 03/11/13 0802  . hydrOXYzine (ATARAX/VISTARIL) tablet 50 mg  50 mg Oral Q4H PRN Rachael Fee, MD   50 mg at 03/11/13 0804  . magnesium hydroxide (MILK OF MAGNESIA) suspension 30 mL  30 mL Oral Daily PRN Verne Spurr, PA-C      . mirtazapine (REMERON) tablet 30 mg  30 mg Oral QHS Rachael Fee, MD   30 mg at 03/10/13 2204  . multivitamin with minerals tablet 1 tablet  1 tablet Oral Daily Verne Spurr, PA-C   1 tablet at 03/11/13 0802  . nicotine (NICODERM CQ - dosed in mg/24 hours) patch 21 mg  21 mg Transdermal Q0600 Verne Spurr, PA-C   21 mg at 03/11/13 0802  .  thiamine (B-1) injection 100 mg  100 mg Intramuscular Once [employer], PA-C      . thiamine (VITAMIN B-1) tablet 100 mg  100 mg Oral Daily Verne Spurr, PA-C   100 mg at 03/11/13 0802  . traZODone (DESYREL) tablet 50 mg  50 mg Oral QHS PRN Verne Spurr, PA-C   50 mg at 03/07/13 2142  . zolpidem (AMBIEN) tablet 10 mg  10 mg Oral QHS PRN Mike Craze, MD        Lab Results: No results found for this or any previous visit (from the past 48 hour(s)).  Physical Findings: AIMS: Facial and Oral Movements Muscles of Facial Expression: None, normal Lips and Perioral Area: None, normal Jaw: None, normal Tongue: None, normal,Extremity  Movements Upper (arms, wrists, hands, fingers): None, normal Lower (legs, knees, ankles, toes): None, normal, Trunk Movements Neck, shoulders, hips: None, normal, Overall Severity Severity of abnormal movements (highest score from questions above): None, normal Incapacitation due to abnormal movements: None, normal Patient's awareness of abnormal movements (rate only patient's report): No Awareness, Dental Status Current problems with teeth and/or dentures?: No Does patient usually wear dentures?: No  CIWA:  CIWA-Ar Total: 0 COWS:  COWS Total Score: 0  Treatment Plan Summary: Daily contact with patient to assess and evaluate symptoms and progress in treatment Medication management  Plan: Supportive approach/coping skills/relapse prevention           Explore placement options           Tegretol level 9.6            CBT Medical Decision Making Problem Points:  Review of last therapy session (1) and Review of psycho-social stressors (1) Data Points:  Review or order clinical lab tests (1) Review of medication regiment & side effects (2)  I certify that inpatient services furnished can reasonably be expected to improve the patient's condition.   Roy Tokarz A 03/11/2013, 12:43 PM

## 2013-03-11 NOTE — Progress Notes (Signed)
D: Patient resting in bed with eyes closed.  Respirations even and unlabored.  Patient appears to be in no apparent distress. A: Staff to monitor Q 15 mins for safety.   R:Patient remains safe on the unit.  

## 2013-03-11 NOTE — Progress Notes (Signed)
D-Patient is seclusive to room with no group or peer participation.  Rates depression at 6 and hopelessness at 7. C/O "cravings" r/t withdrawal."Constant" SI thoughts but contracts for safety.  Reports fair sleep and poor appetite.  A- Support and encouragement given. Continue POC and evaluation of treatment goals. Cont 15' checks for safety.  R- Isolative,depressed.  Safety maintained.

## 2013-03-11 NOTE — Progress Notes (Signed)
Adult Psychoeducational Group Note  Date:  03/11/2013 Time:  1:11 PM  Group Topic/Focus:  Crisis Planning:   The purpose of this group is to help patients create a crisis plan for use upon discharge or in the future, as needed.  Participation Level:  Did Not Attend  Additional Comments:  Pt was prompted several times to come to group but instead stayed in bed.   Dalia Heading 03/11/2013, 1:11 PM

## 2013-03-11 NOTE — Progress Notes (Signed)
Patient ID: Curtis Blankenship, male   DOB: Jul 09, 1981, 32 y.o.   MRN: [id number] CSW met with patient to discuss possible options for treatment as [facility].  Patient insists the [place] is his his first choice and remains uninterested in other options.  Patient reports he [facility] and insist he can go anywhere he wants but only wishes to go to [place] in [place] or a year long program.  CSW will continue to to assess for followup. [name], LCSWA

## 2013-03-12 NOTE — Progress Notes (Signed)
Patient ID: Curtis Blankenship, male   DOB: Apr 13, 1981, 32 y.o.   MRN: [id number] PER STATE REGULATIONS 482.30  THIS CHART WAS REVIEWED FOR MEDICAL NECESSITY WITH RESPECT TO THE PATIENT'S ADMISSION/ DURATION OF STAY.  NEXT REVIEW DATE: 03/15/2013  Willa Rough, RN, BSN CASE MANAGER

## 2013-03-12 NOTE — Progress Notes (Signed)
Recreation Therapy Notes  Date: 05.15.2014 Time: 3:00pm Location: [address] Dayroom      Group Topic/Focus: Leisure Education  Participation Level: Did not attend  Ludia Gartland L Sedonia Kitner, LRT/CTRS  Thane Age L 03/12/2013 4:23 PM 

## 2013-03-12 NOTE — Progress Notes (Signed)
[facility] MD Progress Note  03/12/2013 6:14 PM Roshawn Ayala  MRN:  [id number] Subjective:  Curtis Blankenship is still endorsing mood instability. He has reacted adversely to conflictive interactions with another patient that trigger his PTSD symptoms. He is trying to anticipate what are his options as he gets out of here. Still dealing with a lot os shame and guilt. Tending to isolate Diagnosis:  Alcohol Dependence, PTSD, Mood Disorder NOS  ADL's:  Intact  Sleep: Fair  Appetite:  Fair  Suicidal Ideation:  Plan:  denies Intent:  denies Means:  denies Homicidal Ideation:  Plan:  denies Intent:  denies Means:  denies AEB (as evidenced by):  Psychiatric Specialty Exam: Review of Systems  Constitutional: Negative.   HENT: Negative.   Eyes: Negative.   Respiratory: Negative.   Cardiovascular: Negative.   Gastrointestinal: Negative.   Genitourinary: Negative.   Musculoskeletal: Negative.   Skin: Negative.   Neurological: Negative.   Endo/Heme/Allergies: Negative.   Psychiatric/Behavioral: Positive for depression and substance abuse. The patient is nervous/anxious.     Blood pressure 127/83, pulse 93, temperature 97.7 F (36.5 C), temperature source Oral, resp. rate 16, height 5\' 9"  (1.753 m), weight 76.204 kg (168 lb).Body mass index is 24.8 kg/(m^2).  General Appearance: Disheveled  Eye Solicitor::  Fair  Speech:  Clear and Coherent, Slow and not spontaneous  Volume:  fluctuates  Mood:  Anxious, Dysphoric, Irritable and worried  Affect:  anxious  Thought Process:  Coherent and Goal Directed  Orientation:  Full (Time, Place, and Person)  Thought Content:  Rumination  Suicidal Thoughts:  No  Homicidal Thoughts:  No  Memory:  Immediate;   Fair Recent;   Fair Remote;   Fair  Judgement:  Fair  Insight:  Present  Psychomotor Activity:  Restlessness  Concentration:  Fair  Recall:  Fair  Akathisia:  No  Handed:  Right  AIMS (if indicated):     Assets:  Desire for Improvement  Sleep:   Number of Hours: 6.75   Current Medications: Current Facility-Administered Medications  Medication Dose Route Frequency Provider Last Rate Last Dose  . acetaminophen (TYLENOL) tablet 650 mg  650 mg Oral Q6H PRN Verne Spurr, PA-C      . alum & mag hydroxide-simeth (MAALOX/MYLANTA) 200-200-20 MG/5ML suspension 30 mL  30 mL Oral Q4H PRN Verne Spurr, PA-C      . carbamazepine (TEGRETOL) tablet 200 mg  200 mg Oral QID Mike Craze, MD   200 mg at 03/12/13 1659  . desvenlafaxine (PRISTIQ) 24 hr tablet 50 mg  50 mg Oral Daily Mike Craze, MD   50 mg at 03/12/13 0819  . hydrOXYzine (ATARAX/VISTARIL) tablet 50 mg  50 mg Oral Q4H PRN Rachael Fee, MD   50 mg at 03/11/13 0804  . magnesium hydroxide (MILK OF MAGNESIA) suspension 30 mL  30 mL Oral Daily PRN Verne Spurr, PA-C      . mirtazapine (REMERON) tablet 30 mg  30 mg Oral QHS Rachael Fee, MD   30 mg at 03/11/13 2204  . multivitamin with minerals tablet 1 tablet  1 tablet Oral Daily Verne Spurr, PA-C   1 tablet at 03/12/13 0819  . nicotine (NICODERM CQ - dosed in mg/24 hours) patch 21 mg  21 mg Transdermal Q0600 Verne Spurr, PA-C   21 mg at 03/12/13 0820  . thiamine (B-1) injection 100 mg  100 mg Intramuscular Once [employer], PA-C      . thiamine (VITAMIN B-1) tablet 100 mg  100 mg Oral Daily Verne Spurr, PA-C   100 mg at 03/12/13 1610  . traZODone (DESYREL) tablet 50 mg  50 mg Oral QHS PRN Verne Spurr, PA-C   50 mg at 03/07/13 2142  . zolpidem (AMBIEN) tablet 10 mg  10 mg Oral QHS PRN Mike Craze, MD        Lab Results: No results found for this or any previous visit (from the past 48 hour(s)).  Physical Findings: AIMS: Facial and Oral Movements Muscles of Facial Expression: None, normal Lips and Perioral Area: None, normal Jaw: None, normal Tongue: None, normal,Extremity Movements Upper (arms, wrists, hands, fingers): None, normal Lower (legs, knees, ankles, toes): None, normal, Trunk Movements Neck, shoulders,  hips: None, normal, Overall Severity Severity of abnormal movements (highest score from questions above): None, normal Incapacitation due to abnormal movements: None, normal Patient's awareness of abnormal movements (rate only patient's report): No Awareness, Dental Status Current problems with teeth and/or dentures?: No Does patient usually wear dentures?: No  CIWA:  CIWA-Ar Total: 0 COWS:  COWS Total Score: 0  Treatment Plan Summary: Daily contact with patient to assess and evaluate symptoms and progress in treatment Medication management  Plan: Supportive approach/coping skills           Optimize treatment with the medications           [facility]  Medical Decision Making Problem Points:  Review of psycho-social stressors (1) Data Points:  Review of medication regiment & side effects (2)  I certify that inpatient services furnished can reasonably be expected to improve the patient's condition.   Coden Franchi A 03/12/2013, 6:14 PM

## 2013-03-12 NOTE — Progress Notes (Signed)
Patient did not attend the evening karaoke group. Pt remained in bed sleeping after being notified that group was beginning.

## 2013-03-12 NOTE — Progress Notes (Signed)
D: Patient appropriate and cooperative with staff and peers. Patient's affect/mood is blunted and slightly depressed at times. He reported on the self inventory sheet that he slept well, appetite/ability to pay attention is improving, and energy level is low. Patient rated depression and feelings of hopelessness "5". Compliant with medication regimen.  A: Support and encouragement provided to patient. Scheduled medications administered per MD orders. Maintain Q15 minute checks for safety.  R: Patient receptive. Passive SI, but contracts for safety. Denies HI and AVH. Patient remains safe.

## 2013-03-12 NOTE — [facility] Group Notes (Signed)
[facility] LCSW Group Therapy  03/12/2013  1:15 PM   Type of Therapy:  Group Therapy  Participation Level:  Active  Participation Quality:  Appropriate and Attentive  Affect:  Appropriate, Flat and Depressed  Cognitive:  Alert and Appropriate  Insight:  Developing/Improving and Engaged  Engagement in Therapy:  Developing/Improving and Engaged  Modes of Intervention:  Clarification, Confrontation, Discussion, Education, Exploration, Limit-setting, Orientation, Problem-solving, Rapport Building, Socialization and Support  Summary of Progress/Problems: The topic for group was balance in life.  Pt participated in the discussion about when their life was in balance and out of balance and how this feels.  Pt discussed ways to get back in balance and short term goals they can work on to get where they want to be.  Pt shared that he was isolating himself to the point of losing track of time and being surrounded by beer cans.  Pt shared that there are consequences to his substance use, noting DWI's, totaling cars and losing relationships with family.  Pt was able to identify that he wants stability in his life which would look like being functional but is having a hard time following through with his plans.    Curtis Blankenship, LCSWA 03/12/2013 2:30 PM

## 2013-03-13 DIAGNOSIS — F329 Major depressive disorder, single episode, unspecified: Secondary | ICD-10-CM

## 2013-03-13 NOTE — Tx Team (Addendum)
Interdisciplinary Treatment Plan Update (Adult)  Date: 03/13/13 Time Reviewed:  10:00 AM  Progress in Treatment: Attending groups: Yes Participating in groups:  Yes Taking medication as prescribed:  Yes Tolerating medication:  Yes Family/Significant othe contact made: No Patient understands diagnosis:  Yes Discussing patient identified problems/goals with staff:  Yes Medical problems stabilized or resolved:  Yes Denies suicidal/homicidal ideation: Yes Issues/concerns per patient self-inventory:  Yes Other:  New problem(s) identified: N/A  Discharge Plan or Barriers: Plan is [facility] access [facility] patient [facility] invested in at [facility]  Reason for Continuation of Hospitalization: Anxiety Depression Medication Stabilization  Comments: N/A  Estimated length of stay: 3-5 days  For review of initial/current patient goals, please see plan of care.  Attendees: Patient:     Family:     Physician:  Dr. Dub Mikes 03/13/13 10:00 AM  Nursing:   Alease Frame, RN 03/13/13 10:00 AM  Clinical Social Worker:  Ronda Fairly, LCSWA 03/13/13 10:00 AM  Other:  Lowella Grip, RN 03/13/13 10:00 AM  Other:   03/13/13 10:00 AM  Other:  Liliane Bade, BSW (Transitional Care Manager) 03/13/13 10:00 AM  Other:     Other:    Other:    Other:    Other:    Other:    Other:     Scribe for Treatment Team:   [name], LCSWA   03/13/13 , 10:00 AM

## 2013-03-13 NOTE — Progress Notes (Signed)
D   Pt has been irritable this evening  He did not go to group and did not come for his hs medications   He was requested to get medications by staff but never came for them   He interacts minimally with select peers A   Verbal support offered   Medications offered   Q 15 min checks R   Pt safe at present

## 2013-03-13 NOTE — Progress Notes (Signed)
Chaplain saw pt in response to spiritual care consult.   Provided support around grief, anxiety, fear.   Pt spoke with chaplain about history of substance use, describing managing symptoms of PTSD,   Spoke about putting together discharge plan to attend rehab in [facility] and feeling that he needs to "get away and build my life again."   Fearful that he may not succeed, but feels motivated.    Spoke briefly about feeling as though his life should be on par with his friends who are "starting careers and families."   Chaplain and pt talked about unhelpfulness of "should" and pt and chaplain reflected on what he may want for his life and who he feels like he is when he is not consumed with substance use.  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    Spoke with chaplain briefly about grief around events that precipitated his feeling as though he needed to cope with substance use.  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

## 2013-03-13 NOTE — Progress Notes (Signed)
D.  Pt in room reading book on approach, no complaints voiced other than insomnia for which he requested Ambien tonight.  Pt did attend AA group this evening but other than that he remained in room reading.  Pt came down to medication window for his night time medications.  Pt denies SI/HI/hallucinations at this time.  A.  Support and encouragement offered  R.  Pt remains safe on unit, will continue to monitor.

## 2013-03-13 NOTE — [facility] Group Notes (Addendum)
[facility] LCSW Aftercare Discharge Planning Group Note   03/13/2013 8:45 AM  Participation Quality:  Adequate  Mood/Affect:  Anxious  Depression Rating:  "none, but it varies" (refused to rate with number)  Anxiety Rating:  "alot, I'm having racing thoughts"   Thoughts of Suicide:  No Will you contract for safety?   NA  Current AVH:  No  Plan for Discharge/Comments:  [facility] today at 1:30  Transportation Means:  Family  Supports: Family  Harrill, Julious Payer

## 2013-03-13 NOTE — Progress Notes (Signed)
[facility] MD Progress Note  03/13/2013 3:43 PM Curtis Blankenship  MRN:  [id number] Subjective:  Curtis Blankenship continues to evidence anxiety. He is trying to put together a plan for when he leaves. He wants to go the [facility] and pursue rehab. A bed could be available in the next few weeks. He is trying to come up with a safety plan. Still does not trust himself Diagnosis:  Alcohol Dependence, Cocaine Abuse, PTSD, Major Depression  ADL's:  Intact  Sleep: Fair  Appetite:  Fair  Suicidal Ideation:  Plan:  denies Intent:  denies Means:  denies Homicidal Ideation:  Plan:  denies Intent:  denies Means:  denies AEB (as evidenced by):  Psychiatric Specialty Exam: Review of Systems  Constitutional: Negative.   HENT: Negative.   Eyes: Negative.   Respiratory: Negative.   Cardiovascular: Negative.   Gastrointestinal: Negative.   Genitourinary: Negative.   Musculoskeletal: Negative.   Skin: Negative.   Neurological: Negative.   Endo/Heme/Allergies: Negative.   Psychiatric/Behavioral: Positive for depression and substance abuse. The patient is nervous/anxious.     Blood pressure 134/88, pulse 92, temperature 97.7 F (36.5 C), temperature source Oral, resp. rate 16, height 5\' 9"  (1.753 m), weight 76.204 kg (168 lb).Body mass index is 24.8 kg/(m^2).  General Appearance: Disheveled  Eye Solicitor::  Fair  Speech:  Clear and Coherent  Volume:  fluctuates  Mood:  Anxious and worried  Affect:  anxious, worried  Thought Process:  Coherent and Goal Directed  Orientation:  Full (Time, Place, and Person)  Thought Content:  worries, concerns, fear of relapsing  Suicidal Thoughts:  No  Homicidal Thoughts:  No  Memory:  Immediate;   Fair Recent;   Fair Remote;   Fair  Judgement:  Fair  Insight:  Present  Psychomotor Activity:  Restlessness  Concentration:  Fair  Recall:  Fair  Akathisia:  No  Handed:  Right  AIMS (if indicated):     Assets:  Desire for Improvement  Sleep:  Number of Hours: 4.25    Current Medications: Current Facility-Administered Medications  Medication Dose Route Frequency Provider Last Rate Last Dose  . acetaminophen (TYLENOL) tablet 650 mg  650 mg Oral Q6H PRN Verne Spurr, PA-C      . alum & mag hydroxide-simeth (MAALOX/MYLANTA) 200-200-20 MG/5ML suspension 30 mL  30 mL Oral Q4H PRN Verne Spurr, PA-C      . carbamazepine (TEGRETOL) tablet 200 mg  200 mg Oral QID Mike Craze, MD   200 mg at 03/13/13 1202  . desvenlafaxine (PRISTIQ) 24 hr tablet 50 mg  50 mg Oral Daily Mike Craze, MD   50 mg at 03/13/13 0743  . hydrOXYzine (ATARAX/VISTARIL) tablet 50 mg  50 mg Oral Q4H PRN Rachael Fee, MD   50 mg at 03/13/13 1453  . magnesium hydroxide (MILK OF MAGNESIA) suspension 30 mL  30 mL Oral Daily PRN Verne Spurr, PA-C      . mirtazapine (REMERON) tablet 30 mg  30 mg Oral QHS Rachael Fee, MD   30 mg at 03/11/13 2204  . multivitamin with minerals tablet 1 tablet  1 tablet Oral Daily Verne Spurr, PA-C   1 tablet at 03/13/13 [id number]  . nicotine (NICODERM CQ - dosed in mg/24 hours) patch 21 mg  21 mg Transdermal Q0600 Verne Spurr, PA-C   21 mg at 03/13/13 0743  . thiamine (B-1) injection 100 mg  100 mg Intramuscular Once [employer], PA-C      . thiamine (VITAMIN B-1)  tablet 100 mg  100 mg Oral Daily Verne Spurr, PA-C   100 mg at 03/13/13 0743  . traZODone (DESYREL) tablet 50 mg  50 mg Oral QHS PRN Verne Spurr, PA-C   50 mg at 03/07/13 2142  . zolpidem (AMBIEN) tablet 10 mg  10 mg Oral QHS PRN Mike Craze, MD        Lab Results: No results found for this or any previous visit (from the past 48 hour(s)).  Physical Findings: AIMS: Facial and Oral Movements Muscles of Facial Expression: None, normal Lips and Perioral Area: None, normal Jaw: None, normal Tongue: None, normal,Extremity Movements Upper (arms, wrists, hands, fingers): None, normal Lower (legs, knees, ankles, toes): None, normal, Trunk Movements Neck, shoulders, hips: None, normal,  Overall Severity Severity of abnormal movements (highest score from questions above): None, normal Incapacitation due to abnormal movements: None, normal Patient's awareness of abnormal movements (rate only patient's report): No Awareness, Dental Status Current problems with teeth and/or dentures?: No Does patient usually wear dentures?: No  CIWA:  CIWA-Ar Total: 0 COWS:  COWS Total Score: 0  Treatment Plan Summary: Daily contact with patient to assess and evaluate symptoms and progress in treatment Medication management  Plan: Supportive approach/coping skills/relapse prevention           Continue to optimize treatment   Medical Decision Making Problem Points:  Review of psycho-social stressors (1) Data Points:  Review of medication regiment & side effects (2)  I certify that inpatient services furnished can reasonably be expected to improve the patient's condition.   Eashan Schipani A 03/13/2013, 3:43 PM

## 2013-03-13 NOTE — Progress Notes (Signed)
D: Patient appropriate and cooperative with staff and peers. Patient's affect/mood is blunted and depressed. Declined self inventory sheet today. Writer observed patient interacting with peers in the hallway and in the dayroom.  A: Support and encouragement provided to patient. Administered scheduled medications per MD orders. Monitor Q15 minute checks for safety.  R: Patient receptive. Denies SI/HI/AVH. Patient remains safe on the unit.

## 2013-03-13 NOTE — [facility] Group Notes (Signed)
[facility] LCSW Group Therapy  03/13/2013  1:15 PM  Type of Therapy:  Group Therapy  Participation Level:  Did Not Attend  Harrill, Catherine Campbell  

## 2013-03-14 DIAGNOSIS — F329 Major depressive disorder, single episode, unspecified: Secondary | ICD-10-CM

## 2013-03-14 NOTE — Progress Notes (Signed)
D.  Pt positive for evening AA group, pleasant on approach.  Pt organizing card game with peers, out of room more tonight and visible on unit.  Interacting appropriately with peers.  Denies SI/HI/hallucinations at this time.  No complaints voiced.  A. Support and encouragement offered  R.  Pt remains safe on unit, will continue to monitor.

## 2013-03-14 NOTE — Progress Notes (Signed)
[facility] MD Progress Note  03/14/2013 2:32 PM Curtis Blankenship  MRN:  [id number] Subjective:  Concerned about his ability to stay sober long term. States that in the past he has gotten too complacent and thinks he can drink the one drink. He is anxious about going back to AA and take "the walk of shame," to pick up his white chip. He states he needs a long term program. Worried about this last relapse as it is getting worst and the alcohol triggers his anxiety, PTSD symptoms Diagnosis:  PTSD, Alcohol Dependence, Depressive Disorder NOS  ADL's:  Intact  Sleep: Fair  Appetite:  Fair  Suicidal Ideation:  Plan:  denies Intent:  denies Means:  denies Homicidal Ideation:  Plan:  denies Intent:  denies Means:  denies AEB (as evidenced by):  Psychiatric Specialty Exam: Review of Systems  Constitutional: Negative.   HENT: Negative.   Eyes: Negative.   Respiratory: Negative.   Cardiovascular: Negative.   Gastrointestinal: Negative.   Genitourinary: Negative.   Musculoskeletal: Negative.   Skin: Negative.   Neurological: Negative.   Endo/Heme/Allergies: Negative.   Psychiatric/Behavioral: Positive for substance abuse. The patient is nervous/anxious.     Blood pressure 132/90, pulse 92, temperature 98.7 F (37.1 C), temperature source Oral, resp. rate 16, height 5\' 9"  (1.753 m), weight 76.204 kg (168 lb).Body mass index is 24.8 kg/(m^2).  General Appearance: Disheveled  Eye Solicitor::  Fair  Speech:  Clear and Coherent  Volume:  Normal  Mood:  Anxious and worried  Affect:  Restricted  Thought Process:  Coherent and Goal Directed  Orientation:  Full (Time, Place, and Person)  Thought Content:  worries, concerns  Suicidal Thoughts:  No  Homicidal Thoughts:  No  Memory:  Immediate;   Fair Recent;   Fair Remote;   Fair  Judgement:  Fair  Insight:  Present  Psychomotor Activity:  Restlessness  Concentration:  Fair  Recall:  Fair  Akathisia:  No  Handed:  Right  AIMS (if  indicated):     Assets:  Desire for Improvement  Sleep:  Number of Hours: 7   Current Medications: Current Facility-Administered Medications  Medication Dose Route Frequency Provider Last Rate Last Dose  . acetaminophen (TYLENOL) tablet 650 mg  650 mg Oral Q6H PRN Verne Spurr, PA-C      . alum & mag hydroxide-simeth (MAALOX/MYLANTA) 200-200-20 MG/5ML suspension 30 mL  30 mL Oral Q4H PRN Verne Spurr, PA-C      . carbamazepine (TEGRETOL) tablet 200 mg  200 mg Oral QID Mike Craze, MD   200 mg at 03/14/13 1249  . desvenlafaxine (PRISTIQ) 24 hr tablet 50 mg  50 mg Oral Daily Mike Craze, MD   50 mg at 03/14/13 0834  . hydrOXYzine (ATARAX/VISTARIL) tablet 50 mg  50 mg Oral Q4H PRN Rachael Fee, MD   50 mg at 03/13/13 1453  . magnesium hydroxide (MILK OF MAGNESIA) suspension 30 mL  30 mL Oral Daily PRN Verne Spurr, PA-C      . mirtazapine (REMERON) tablet 30 mg  30 mg Oral QHS Rachael Fee, MD   30 mg at 03/13/13 2226  . multivitamin with minerals tablet 1 tablet  1 tablet Oral Daily Verne Spurr, PA-C   1 tablet at 03/14/13 [id number]  . nicotine (NICODERM CQ - dosed in mg/24 hours) patch 21 mg  21 mg Transdermal Q0600 Verne Spurr, PA-C   21 mg at 03/14/13 0834  . thiamine (B-1) injection 100 mg  100 mg  Intramuscular Once Verne Spurr, PA-C      . thiamine (VITAMIN B-1) tablet 100 mg  100 mg Oral Daily Verne Spurr, PA-C   100 mg at 03/14/13 0834  . traZODone (DESYREL) tablet 50 mg  50 mg Oral QHS PRN Verne Spurr, PA-C   50 mg at 03/07/13 2142  . zolpidem (AMBIEN) tablet 10 mg  10 mg Oral QHS PRN Mike Craze, MD   10 mg at 03/13/13 2226    Lab Results: No results found for this or any previous visit (from the past 48 hour(s)).  Physical Findings: AIMS: Facial and Oral Movements Muscles of Facial Expression: None, normal Lips and Perioral Area: None, normal Jaw: None, normal Tongue: None, normal,Extremity Movements Upper (arms, wrists, hands, fingers): None, normal Lower  (legs, knees, ankles, toes): None, normal, Trunk Movements Neck, shoulders, hips: None, normal, Overall Severity Severity of abnormal movements (highest score from questions above): None, normal Incapacitation due to abnormal movements: None, normal Patient's awareness of abnormal movements (rate only patient's report): No Awareness, Dental Status Current problems with teeth and/or dentures?: No Does patient usually wear dentures?: No  CIWA:  CIWA-Ar Total: 0 COWS:  COWS Total Score: 0  Treatment Plan Summary: Daily contact with patient to assess and evaluate symptoms and progress in treatment Medication management  Plan: Supportive approach/coping skills/relapse prevention           Consider Naltrexone vs. Campral  Medical Decision Making Problem Points:  Review of psycho-social stressors (1) Data Points:  Review of medication regiment & side effects (2)  I certify that inpatient services furnished can reasonably be expected to improve the patient's condition.   Vaudie Engebretsen A 03/14/2013, 2:32 PM

## 2013-03-14 NOTE — Progress Notes (Signed)
Psychoeducational Group Note  Date:  03/14/13 Time: 1300  Group Topic/Focus:  Identifying Needs:   The focus of this group is to help patients identify their personal needs that have been historically problematic and identify healthy behaviors to address their needs.  Participation Level:  active Participation Quality: good Affect: flat Cognitive: good   Insight:  good  Engagement in Group: engaged  Additional Comments:   PD RN [facility]

## 2013-03-14 NOTE — [facility] Group Notes (Signed)
[facility] Group Notes: (Clinical Social Work)   03/14/2013      Type of Therapy:  Group Therapy   Participation Level:  Did Not Attend    Ambrose Mantle, LCSW 03/14/2013, 12:24 PM

## 2013-03-14 NOTE — Progress Notes (Signed)
Patient did attend the evening speaker AA meeting.  

## 2013-03-14 NOTE — Progress Notes (Signed)
D   Pt is pleasant and cooperative  He did refuse his self inventory sheet this morning saying he has filled out so many of them and he is just tired of filling them out and tired of being here  He said the doctor was holding him here until they get a bed for him at a treatment center   He did not attend groups A    Verbal support given    Medications administered and effectiveness monitored   Q 15 min checks  R   Pt safe at present

## 2013-03-14 NOTE — Progress Notes (Signed)
Psychoeducational Group Note  Date:  03/14/2013 Time:  0945 am  Group Topic/Focus:  Identifying Needs:   The focus of this group is to help patients identify their personal needs that have been historically problematic and identify healthy behaviors to address their needs.  Self inventory  Participation Level:  Did Not Attend   [name] 03/14/2013,10:50 AM

## 2013-03-14 NOTE — Progress Notes (Signed)
Date: 03/14/2013  Time: 4:10 PM  Group Topic/Focus:  Healthy Communication: The focus of this group is to discuss communication, barriers to communication, as well as healthy ways to communicate with others.  Participation Level: Did Not Attend  Participation Quality:  Affect:  Cognitive:  Insight:  Engagement in Group:  Modes of Intervention:  Additional Comments: Pt was in the bed sleeping, pt refused to attend group.  Junell Cullifer M  03/14/2013, 4:10 PM  

## 2013-03-15 NOTE — [facility] Group Notes (Addendum)
[facility] Group Notes: (Clinical Social Work)   03/15/2013      Type of Therapy:  Group Therapy   Participation Level:  Did Not Attend    Ambrose Mantle, LCSW 03/15/2013, 4:56 PM

## 2013-03-15 NOTE — Progress Notes (Signed)
Adult Psychoeducational Group Note  Date:  03/15/2013 Time:  3:51 PM  Group Topic/Focus:  Conflict Resolution:   The focus of this group is to discuss the conflict resolution process and how it may be used upon discharge.  Participation Level:  Did Not Attend  Participation Quality:    Affect:    Cognitive:    Insight:   Engagement in Group:    Modes of Intervention:    Additional Comments:  Pt was sleeping, pt never attends groups.  Isla Pence M 03/15/2013, 3:51 PM

## 2013-03-15 NOTE — Progress Notes (Signed)
D   Pt has isolated to his room today   He was speaking very negatively about his treatment and continues to want to be discharged   He is much less engaging than he was yesterday and is isolating more and interacting with others less A   Verbal support given   Medications administered and effectiveness monitored   Q 15 min checks R   Pt safe at present

## 2013-03-15 NOTE — Progress Notes (Signed)
Psychoeducational Group Note  Date:  03/15/2013 Time: 1300 Group Topic/Focus:  Making Healthy Choices:   The focus of this group is to help patients identify negative/unhealthy choices they were using prior to admission and identify positive/healthier coping strategies to replace them upon discharge.  Participation Level:  Did Not Attend   Additional Comments:    Rich Brave 3:01 PM. 03/15/2013

## 2013-03-15 NOTE — Progress Notes (Signed)
D.  Pt in bed on approach, did not feel well enough to attend evening AA group tonight.  Has been interacting appropriately within milieu, took medications as ordered.  Denies SI/HI/hallucinations at this time.  Requested Ambien for sleep.  A.  Support and encouragement offered, medication given as ordered R.  Pt remains safe on unit, will continue to monitor.

## 2013-03-15 NOTE — Progress Notes (Signed)
Psychoeducational Group Note  Date:  03/15/2013 Time:  0945 am  Group Topic/Focus:  Making Healthy Choices:   The focus of this group is to help patients identify negative/unhealthy choices they were using prior to admission and identify positive/healthier coping strategies to replace them upon discharge.  Participation Level:  Did Not Attend   Zakirah Weingart J 03/15/2013, 10:29 AM 

## 2013-03-15 NOTE — Progress Notes (Signed)
[facility] MD Progress Note  03/15/2013 3:24 PM Curtis Blankenship  MRN:  [id number] Subjective:  Anticipating discharge in the morning. He is planning to stay home with his mother and father, possibly come to the [facility] and be calling every other day to the [facility] for a bed in their rehab. He is worried due to his inability to maintain sobriety. He admits that he has a lot going on for himself but his relapses has kept him from being able to do the things he is capable of accomplishing.  Diagnosis:  Alcohol Dependence, PTSD, Depressive Disorder NOS  ADL's:  Intact  Sleep: Fair  Appetite:  Fair  Suicidal Ideation:  Plan:  denies Intent:  denies Means:  denies Homicidal Ideation:  Plan:  denies Intent:  denies Means:  denies AEB (as evidenced by):  Psychiatric Specialty Exam: Review of Systems  Psychiatric/Behavioral: Positive for substance abuse.    Blood pressure 107/69, pulse 102, temperature 98.7 F (37.1 C), temperature source Oral, resp. rate 18, height 5\' 9"  (1.753 m), weight 76.204 kg (168 lb).Body mass index is 24.8 kg/(m^2).  General Appearance: Fairly Groomed  Patent attorney::  Fair  Speech:  Clear and Coherent  Volume:  Normal  Mood:  worried  Affect:  worried  Thought Process:  Coherent and Goal Directed  Orientation:  Full (Time, Place, and Person)  Thought Content:  worries, concerns  Suicidal Thoughts:  No  Homicidal Thoughts:  No  Memory:  Immediate;   Fair Recent;   Fair Remote;   Fair  Judgement:  Fair  Insight:  Present  Psychomotor Activity:  Restlessness  Concentration:  Fair  Recall:  Fair  Akathisia:  No  Handed:  Right  AIMS (if indicated):     Assets:  Desire for Improvement  Sleep:  Number of Hours: 4.25   Current Medications: Current Facility-Administered Medications  Medication Dose Route Frequency Provider Last Rate Last Dose  . acetaminophen (TYLENOL) tablet 650 mg  650 mg Oral Q6H PRN Verne Spurr, PA-C      . alum & mag  hydroxide-simeth (MAALOX/MYLANTA) 200-200-20 MG/5ML suspension 30 mL  30 mL Oral Q4H PRN Verne Spurr, PA-C      . carbamazepine (TEGRETOL) tablet 200 mg  200 mg Oral QID Mike Craze, MD   200 mg at 03/15/13 1253  . desvenlafaxine (PRISTIQ) 24 hr tablet 50 mg  50 mg Oral Daily Mike Craze, MD   50 mg at 03/15/13 0807  . hydrOXYzine (ATARAX/VISTARIL) tablet 50 mg  50 mg Oral Q4H PRN Rachael Fee, MD   50 mg at 03/13/13 1453  . magnesium hydroxide (MILK OF MAGNESIA) suspension 30 mL  30 mL Oral Daily PRN Verne Spurr, PA-C      . mirtazapine (REMERON) tablet 30 mg  30 mg Oral QHS Rachael Fee, MD   30 mg at 03/14/13 2237  . multivitamin with minerals tablet 1 tablet  1 tablet Oral Daily Verne Spurr, PA-C   1 tablet at 03/15/13 6962  . nicotine (NICODERM CQ - dosed in mg/24 hours) patch 21 mg  21 mg Transdermal Q0600 Verne Spurr, PA-C   21 mg at 03/15/13 0806  . thiamine (B-1) injection 100 mg  100 mg Intramuscular Once [employer], PA-C      . thiamine (VITAMIN B-1) tablet 100 mg  100 mg Oral Daily Verne Spurr, PA-C   100 mg at 03/15/13 9528  . traZODone (DESYREL) tablet 50 mg  50 mg Oral QHS PRN  Verne Spurr, PA-C   50 mg at 03/07/13 2142  . zolpidem (AMBIEN) tablet 10 mg  10 mg Oral QHS PRN Mike Craze, MD   10 mg at 03/14/13 2237    Lab Results: No results found for this or any previous visit (from the past 48 hour(s)).  Physical Findings: AIMS: Facial and Oral Movements Muscles of Facial Expression: None, normal Lips and Perioral Area: None, normal Jaw: None, normal Tongue: None, normal,Extremity Movements Upper (arms, wrists, hands, fingers): None, normal Lower (legs, knees, ankles, toes): None, normal, Trunk Movements Neck, shoulders, hips: None, normal, Overall Severity Severity of abnormal movements (highest score from questions above): None, normal Incapacitation due to abnormal movements: None, normal Patient's awareness of abnormal movements (rate only  patient's report): No Awareness, Dental Status Current problems with teeth and/or dentures?: No Does patient usually wear dentures?: No  CIWA:  CIWA-Ar Total: 0 COWS:  COWS Total Score: 0  Treatment Plan Summary: Daily contact with patient to assess and evaluate symptoms and progress in treatment Medication management  Plan: Supportive approach/coping skills/relapse prevention           D/C in AM as planned  Medical Decision Making Problem Points:  Review of last therapy session (1) and Review of psycho-social stressors (1) Data Points:  Review of medication regiment & side effects (2)  I certify that inpatient services furnished can reasonably be expected to improve the patient's condition.   Nicol Herbig A 03/15/2013, 3:24 PM

## 2013-03-16 MED ORDER — TRAZODONE HCL 50 MG PO TABS: 50.0000 mg | ORAL_TABLET | Freq: Every evening | ORAL | Status: DC | PRN

## 2013-03-16 MED ORDER — DISULFIRAM 250 MG PO TABS: 250.0000 mg | ORAL_TABLET | Freq: Every day | ORAL | Status: DC

## 2013-03-16 MED ORDER — CARBAMAZEPINE 200 MG PO TABS: 200.0000 mg | ORAL_TABLET | Freq: Four times a day (QID) | ORAL | Status: DC

## 2013-03-16 MED ORDER — ZOLPIDEM TARTRATE 10 MG PO TABS: 10.0000 mg | ORAL_TABLET | Freq: Every evening | ORAL | Status: DC | PRN

## 2013-03-16 MED ORDER — DESVENLAFAXINE SUCCINATE ER 50 MG PO TB24: 50.0000 mg | ORAL_TABLET | Freq: Every day | ORAL | Status: DC

## 2013-03-16 MED ORDER — DISULFIRAM 250 MG PO TABS
250.0000 mg | ORAL_TABLET | Freq: Every day | ORAL | Status: DC
  Administered 2013-03-16: 250 mg via ORAL
  Filled 2013-03-16 (×2): qty 1
  Filled 2013-03-16: qty 14
  Filled 2013-03-16: qty 1

## 2013-03-16 MED ORDER — MIRTAZAPINE 30 MG PO TABS: 30.0000 mg | ORAL_TABLET | Freq: Every day | ORAL | Status: DC

## 2013-03-16 NOTE — Discharge Summary (Signed)
Physician Discharge Summary Note  Patient:  Curtis Blankenship is an 32 y.o., male MRN:  [id number] DOB:  [birth year] Patient phone:  [phone] (home)  Patient address:   [address] [place] [address],   Date of Admission:  03/05/2013  Date of Discharge: 03/16/13  Reason for Admission:  Alcohol intoxication, Cocaine abuse  Discharge Diagnoses: Active Problems:   Alcohol dependence   Cocaine abuse   PTSD (post-traumatic stress disorder)   Depressive disorder   CNS disorder  Review of Systems  Constitutional: Negative.   HENT: Negative.   Eyes: Negative.   Respiratory: Negative.   Cardiovascular: Negative.   Gastrointestinal: Negative.   Genitourinary: Negative.   Musculoskeletal: Negative.   Skin: Negative.   Neurological: Negative.   Endo/Heme/Allergies: Negative.   Psychiatric/Behavioral: Positive for depression (Stabilized with medication prior to discharge) and substance abuse (Alcohol dependence). Negative for suicidal ideas, hallucinations and memory loss. The patient is nervous/anxious (Stabilized with medication prior to discharge) and has insomnia (Stabilized with medication prior to discharge).    Axis Diagnosis:   AXIS I:  Post Traumatic Stress Disorder and Alcohol dependence, Cocaine abuse, Major depression AXIS II:  Deferred AXIS III:   Past Medical History  Diagnosis Date  . Bipolar disorder   . PTSD (post-traumatic stress disorder)   . Depression     AXIS IV:  Alcoholism, Cocaine abuse AXIS V:  63  Level of Care:  OP  Hospital Course:  32 Y/O male who was discharged from our unit December 09, 2011. He was to be admitted to [facility] residential treatment center on February the 18. He now comes having relapse on cocaine, alcohol, benzodiazepines. Admitted to a several days binge. He endorsed suicidal ideas with a plan. He was re admitted. On initial assessment at the ED has very irritate, easily agitated and refused to answer questions. He was admitted  to our unit. He is in bed, still uncooperative, wants medications to help come off the alcohol and the drugs. He has had on going difficulties with anxiety, memories, and intrusive thoughts of the war. He has exhibited drastic mood fluctuations with irritability, anger. Isolates and avoids interactions.  Upon admission into this hospital, and after admission assessment/evaluation, it was determined that patient will need detoxification treatment to stabilize his systems of alcohol intoxication and to combat the withdrawal symptoms of these substances as well. And his discharge plans included a referral to a long term treatment facility for more intense substance abuse treatment. Mr. Boys was then started on Librium protocol for his alcohol detoxification. He was also enrolled in group counseling sessions and activities where he was counseled and learned coping skills that should help him after discharge to cope better, manage his substance abuse problems to maintain a much longer sobriety. He also was enrolled and attended AA/NA meetings being offered and held on this unit. He has no other previous and or identifiable medical conditions that required treatment and or monitoring. However, he was monitored closely for any potential problems that may arise as a result of and or during detoxification treatment. Patient tolerated his treatment regimen and detoxification treatment without any significant adverse effects and or reactions.  Besides the detoxification treatment protocol, [name] also was medicated with Tegretol 200 mg for mood stabilization, Pristiq 50 mg for depression, Disulfiram 250 mg for prevention of alcohol cravings, Trazodone 50 mg for sleep and Ambien for sleep as well. Patient attended treatment team meeting this am and met with the treatment team members. His  reason for admission, present symptoms, substance abuse issues, response to treatment and discharge plans discussed. Patient  endorsed that he is doing well and stable for discharge to pursue the next phase of his substance abuse treatment. It was then decided that he will follow-up care at the [facility] in [place], [place]. [facility]. He was also encouraged to join/attend AA/NA meetings being offered and held within his community. He is instructed and encouraged to get a trusted sponsor from the advise of others or from whomever within the AA meetings seems to make sense, and who has a proven track record, and will hold him responsible for his sobriety.   Upon discharge, patient adamantly denies suicidal, homicidal ideations, auditory, visual hallucinations, delusional thinking and or withdrawal symptoms. Patient left [facility] with all personal belongings in no apparent distress. He received 4 days worth samples of his discharge medications. Transportation per patient's arrangement.   Consults:  None  Significant Diagnostic Studies:  labs: CBC with diff, CMP, UDS, Toxicology tests, U/A  Discharge Vitals:   Blood pressure 133/96, pulse 88, temperature 97.1 F (36.2 C), temperature source Oral, resp. rate 18, height 5\' 9"  (1.753 m), weight 76.204 kg (168 lb). Body mass index is 24.8 kg/(m^2). Lab Results:   No results found for this or any previous visit (from the past 72 hour(s)).  Physical Findings: AIMS: Facial and Oral Movements Muscles of Facial Expression: None, normal Lips and Perioral Area: None, normal Jaw: None, normal Tongue: None, normal,Extremity Movements Upper (arms, wrists, hands, fingers): None, normal Lower (legs, knees, ankles, toes): None, normal, Trunk Movements Neck, shoulders, hips: None, normal, Overall Severity Severity of abnormal movements (highest score from questions above): None, normal Incapacitation due to abnormal movements: None, normal Patient's awareness of abnormal movements (rate only patient's report): No  Awareness, Dental Status Current problems with teeth and/or dentures?: No Does patient usually wear dentures?: No  CIWA:  CIWA-Ar Total: 0 COWS:  COWS Total Score: 0  Psychiatric Specialty Exam: See Psychiatric Specialty Exam and Suicide Risk Assessment completed by Attending Physician prior to discharge.  Discharge destination:  Home  Is patient on multiple antipsychotic therapies at discharge:  No   Has Patient had three or more failed trials of antipsychotic monotherapy by history:  No  Recommended Plan for Multiple Antipsychotic Therapies: NA     Medication List    STOP taking these medications       divalproex 250 MG 24 hr tablet  Commonly known as:  DEPAKOTE ER      TAKE these medications     Indication   carbamazepine 200 MG tablet  Commonly known as:  TEGRETOL  Take 1 tablet (200 mg total) by mouth 4 (four) times daily. For mood stabilization   Indication:  Mood stabilization     desvenlafaxine 50 MG 24 hr tablet  Commonly known as:  PRISTIQ  Take 1 tablet (50 mg total) by mouth daily. For depression   Indication:  Major Depressive Disorder     disulfiram 250 MG tablet  Commonly known as:  ANTABUSE  Take 1 tablet (250 mg total) by mouth daily. For alcohol abstinence   Indication:  Excessive Use of Alcohol     mirtazapine 30 MG tablet  Commonly known as:  REMERON  Take 1 tablet (30 mg total) by mouth at bedtime. For depression/sleep   Indication:  Trouble Sleeping, Major Depressive Disorder     traZODone 50 MG  tablet  Commonly known as:  DESYREL  Take 1 tablet (50 mg total) by mouth at bedtime as needed for sleep.   Indication:  Trouble Sleeping     zolpidem 10 MG tablet  Commonly known as:  AMBIEN  Take 1 tablet (10 mg total) by mouth at bedtime as needed for sleep. For sleep.   Indication:  Trouble Sleeping       Follow-up Information   Follow up with [facility]   [facility])    Contact information:   [address] [place], [place] [address] Phone: [phone] Fax: [phone]     Follow-up recommendations:  Activity:  As tolerated Diet: As recommended by your primary care doctor. Keep all scheduled follow-up appointments as recommended. Continue to work your relapse prevention plan Comments:  Take all your medications as prescribed by your mental healthcare provider. Report any adverse effects and or reactions from your medicines to your outpatient provider promptly. Patient is instructed and cautioned to not engage in alcohol and or illegal drug use while on prescription medicines. In the event of worsening symptoms, patient is instructed to call the crisis hotline, 911 and or go to the nearest ED for appropriate evaluation and treatment of symptoms. Follow-up with your primary care provider for your other medical issues, concerns and or health care needs.   Total Discharge Time:  Greater than 30 minutes.  SignedArmandina Stammer I 03/17/2013, 4:07 PM

## 2013-03-16 NOTE — Progress Notes (Signed)
Psychoeducational Group Note  Date:  03/16/2013 Time:  1000 Group Topic/Focus:  Wellness Toolbox:   The focus of this group is to discuss various aspects of wellness, balancing those aspects and exploring ways to increase the ability to experience wellness.  Patients will create a wellness toolbox for use upon discharge.  Participation Level: Did Not Attend  Participation Quality:  Not Applicable  Affect:  Not Applicable  Cognitive:  Not Applicable  Insight:  Not Applicable  Engagement in Group: Not Applicable  Additional Comments:  Pt remained resting in bed during group. Staff communicated that it was group time and pt remained resting in bed.   Sharyn Lull 03/16/2013, 10:56 AM

## 2013-03-16 NOTE — [facility] Suicide Risk Assessment (Signed)
[facility] INPATIENT:  Family/Significant Other Suicide Prevention Education  Suicide Prevention Education:  Patient refused to sign consent for CSW to provide suicide prevention education to either sister with whom he will be living or mother.  Writer provided suicide prevention education directly to patient; conversation included risk factors, warning signs and resources to contact for help. Mobile crisis services explained and contact card placed in chart for pt to receive at discharge.    [name] 03/16/2013, 11:26 AM

## 2013-03-16 NOTE — Progress Notes (Signed)
[facility] LCSW Aftercare Discharge Planning Group Note   03/16/2013 8:45 AM  Participation Quality:  Appropriate, patient did not attend, CSW met with patient after group 1 to 1   Mood/Affect:  Appropriate  Depression Rating:  1  Anxiety Rating:  7  Thoughts of Suicide:  No Will you contract for safety?   NA  Current AVH:  No  Plan for Discharge/Comments:  Patient to follow up at AA as he awaits followup at [facility]  Transportation Means: Friend  Supports: Sister, mother, AA community friends  Dyane Dustman, Julious Payer

## 2013-03-16 NOTE — Progress Notes (Signed)
Pt was discharged home today.  He denied any S/I H/I or A/V hallucinations.    He was given f/u appointment, rx, sample medications, hotline info booklet.  He voiced understanding to all instructions provided.  He declined the need for smoking cessation materials.  He removed his nicotine patch before he left. 

## 2013-03-16 NOTE — Progress Notes (Signed)
[facility] Adult Case Management Discharge Plan :  Will you be returning to the same living situation after discharge: No. Patient will be living with sister until he admits at [facility] At discharge, do you have transportation home?:Yes,  patient arranging for friend to pick up Do you have the ability to pay for your medications:Yes,  through [place]  Release of information consent forms completed and in the chart;  Patient's signature needed at discharge.  Patient to Follow up at: Follow-up Information   Follow up with [facility] ([facility]  [facility])    Contact information:   [address] [place], [place] [address] Phone: [phone] Fax: [phone]      Patient denies SI/HI:   Yes,  denies both    Safety Planning and Suicide Prevention discussed:  Yes,  with patient  [name] 03/16/2013, 11:30 AM

## 2013-03-16 NOTE — [facility] Suicide Risk Assessment (Signed)
Suicide Risk Assessment  Discharge Assessment     Demographic Factors:  Adolescent or young adult and Caucasian  Mental Status Per Nursing Assessment::   On Admission:  Self-harm thoughts  Current Mental Status by Physician: In full contact with reality. There are no suicidal ideas, plans or intent. His mood is euthymic, his affect is appropriate. He is going to keep calling the [place] until he gets a bed. He is meanwhile going to stay with his mother and father, go to AA and take the Antabuse as a precaution. He is committed to abstinence.   Loss Factors: NA  Historical Factors: NA  Risk Reduction Factors:   Sense of responsibility to family, Living with another person, especially a relative and Positive social support  Continued Clinical Symptoms:  Depression:   Comorbid alcohol abuse/dependence Alcohol/Substance Abuse/Dependencies  Cognitive Features That Contribute To Risk: None identified   Suicide Risk:  Minimal: No identifiable suicidal ideation.  Patients presenting with no risk factors but with morbid ruminations; may be classified as minimal risk based on the severity of the depressive symptoms  Discharge Diagnoses:   AXIS I:  PTD, Alcohol Dependence, Cocaine Abuse AXIS II:  Deferred AXIS III:   Past Medical History  Diagnosis Date  . Bipolar disorder   . PTSD (post-traumatic stress disorder)   . Depression    AXIS IV:  other psychosocial or environmental problems AXIS V:  61-70 mild symptoms  Plan Of Care/Follow-up recommendations:  Activity:  as tolerated Diet:  regular Will be waiting for a bed at the [facility]. Meanwhile will be at home going to AA. Will consider coming to the [facility] Is patient on multiple antipsychotic therapies at discharge:  No   Has Patient had three or more failed trials of antipsychotic monotherapy by history:  No  Recommended Plan for Multiple Antipsychotic Therapies: N/A   Jaimin Krupka A 03/16/2013, 12:16 PM

## 2013-03-17 MED ORDER — DISULFIRAM 250 MG PO TABS: 250.0000 mg | ORAL_TABLET | Freq: Every day | ORAL | Status: DC

## 2013-03-19 NOTE — Progress Notes (Signed)
Patient Discharge Instructions:  No Documentation sent to [facility].  The patient refused to sign the ROI.  Jerelene Redden, 03/19/2013, 1:47 PM

## 2013-04-08 NOTE — ED Provider Notes (Signed)
Medical screening examination/treatment/procedure(s) were performed by non-physician practitioner and as supervising physician I was immediately available for consultation/collaboration.  Alvita Fana, MD 04/08/13 1606 

## 2013-09-19 ENCOUNTER — Encounter (HOSPITAL_COMMUNITY): Payer: Self-pay | Admitting: Emergency Medicine

## 2013-09-19 ENCOUNTER — Emergency Department (HOSPITAL_COMMUNITY): Payer: Medicare Other

## 2013-09-19 ENCOUNTER — Inpatient Hospital Stay (HOSPITAL_COMMUNITY)
Admission: EM | Admit: 2013-09-19 | Discharge: 2013-09-22 | DRG: 897 | Disposition: A | Discharge status: Home / Self Care | Payer: Medicare Other | Attending: Internal Medicine | Admitting: Internal Medicine

## 2013-09-19 DIAGNOSIS — I379 Nonrheumatic pulmonary valve disorder, unspecified: Secondary | ICD-10-CM

## 2013-09-19 DIAGNOSIS — R29818 Other symptoms and signs involving the nervous system: Secondary | ICD-10-CM

## 2013-09-19 DIAGNOSIS — F1910 Other psychoactive substance abuse, uncomplicated: Secondary | ICD-10-CM

## 2013-09-19 DIAGNOSIS — H539 Unspecified visual disturbance: Secondary | ICD-10-CM

## 2013-09-19 DIAGNOSIS — F4310 Post-traumatic stress disorder, unspecified: Secondary | ICD-10-CM | POA: Diagnosis present

## 2013-09-19 DIAGNOSIS — F10231 Alcohol dependence with withdrawal delirium: Principal | ICD-10-CM | POA: Diagnosis present

## 2013-09-19 DIAGNOSIS — G988 Other disorders of nervous system: Secondary | ICD-10-CM

## 2013-09-19 DIAGNOSIS — F329 Major depressive disorder, single episode, unspecified: Secondary | ICD-10-CM

## 2013-09-19 DIAGNOSIS — Z7982 Long term (current) use of aspirin: Secondary | ICD-10-CM

## 2013-09-19 DIAGNOSIS — G969 Disorder of central nervous system, unspecified: Secondary | ICD-10-CM

## 2013-09-19 DIAGNOSIS — F10931 Alcohol use, unspecified with withdrawal delirium: Principal | ICD-10-CM | POA: Diagnosis present

## 2013-09-19 DIAGNOSIS — G459 Transient cerebral ischemic attack, unspecified: Secondary | ICD-10-CM

## 2013-09-19 DIAGNOSIS — Z9119 Patient's noncompliance with other medical treatment and regimen: Secondary | ICD-10-CM

## 2013-09-19 DIAGNOSIS — Z91199 Patient's noncompliance with other medical treatment and regimen due to unspecified reason: Secondary | ICD-10-CM

## 2013-09-19 DIAGNOSIS — F17200 Nicotine dependence, unspecified, uncomplicated: Secondary | ICD-10-CM | POA: Diagnosis present

## 2013-09-19 DIAGNOSIS — F1020 Alcohol dependence, uncomplicated: Secondary | ICD-10-CM

## 2013-09-19 DIAGNOSIS — F1410 Cocaine abuse, uncomplicated: Secondary | ICD-10-CM | POA: Diagnosis present

## 2013-09-19 DIAGNOSIS — Z8249 Family history of ischemic heart disease and other diseases of the circulatory system: Secondary | ICD-10-CM

## 2013-09-19 DIAGNOSIS — F319 Bipolar disorder, unspecified: Secondary | ICD-10-CM | POA: Diagnosis present

## 2013-09-19 DIAGNOSIS — R2990 Unspecified symptoms and signs involving the nervous system: Secondary | ICD-10-CM

## 2013-09-19 DIAGNOSIS — R209 Unspecified disturbances of skin sensation: Secondary | ICD-10-CM

## 2013-09-19 DIAGNOSIS — F1920 Other psychoactive substance dependence, uncomplicated: Secondary | ICD-10-CM | POA: Diagnosis present

## 2013-09-19 LAB — CBC WITH DIFFERENTIAL/PLATELET
Basophils Absolute: 0.1 10*3/uL (ref 0.0–0.1)
HCT: 43.2 % (ref 39.0–52.0)
Lymphocytes Relative: 13 % (ref 12–46)
Neutro Abs: 11.8 10*3/uL — ABNORMAL HIGH (ref 1.7–7.7)
Neutrophils Relative %: 80 % — ABNORMAL HIGH (ref 43–77)
Platelets: 379 10*3/uL (ref 150–400)
RDW: 13 % (ref 11.5–15.5)
WBC: 14.7 10*3/uL — ABNORMAL HIGH (ref 4.0–10.5)

## 2013-09-19 LAB — ETHANOL: Alcohol, Ethyl (B): <11 mg/dL (ref 0–11)

## 2013-09-19 LAB — URINALYSIS, ROUTINE W REFLEX MICROSCOPIC
Bilirubin Urine: NEGATIVE
Glucose, UA: NEGATIVE mg/dL
Hgb urine dipstick: NEGATIVE
Ketones, ur: NEGATIVE mg/dL
Leukocytes, UA: NEGATIVE
Specific Gravity, Urine: 1.018 (ref 1.005–1.030)
pH: 5.5 (ref 5.0–8.0)

## 2013-09-19 LAB — LIPID PANEL
Cholesterol: 181 mg/dL (ref 0–200)
HDL: 51 mg/dL (ref >39)
LDL Cholesterol: 99 mg/dL (ref 0–99)
Triglycerides: 153 mg/dL — ABNORMAL HIGH (ref <150)
VLDL: 31 mg/dL (ref 0–40)

## 2013-09-19 LAB — HEMOGLOBIN A1C
Hgb A1c MFr Bld: 5 % (ref <5.7)
Mean Plasma Glucose: 97 mg/dL (ref <117)

## 2013-09-19 LAB — SODIUM, URINE, RANDOM: Sodium, Ur: 55 mEq/L

## 2013-09-19 LAB — BASIC METABOLIC PANEL
CO2: 23 mEq/L (ref 19–32)
Chloride: 93 mEq/L — ABNORMAL LOW (ref 96–112)
Potassium: 4 mEq/L (ref 3.5–5.1)
Sodium: 131 mEq/L — ABNORMAL LOW (ref 135–145)

## 2013-09-19 LAB — GLUCOSE, CAPILLARY
Glucose-Capillary: 123 mg/dL — ABNORMAL HIGH (ref 70–99)
Glucose-Capillary: 91 mg/dL (ref 70–99)

## 2013-09-19 LAB — HEPATIC FUNCTION PANEL
Alkaline Phosphatase: 92 U/L (ref 39–117)
Bilirubin, Direct: <0.1 mg/dL (ref 0.0–0.3)
Total Bilirubin: 0.7 mg/dL (ref 0.3–1.2)

## 2013-09-19 LAB — RAPID URINE DRUG SCREEN, HOSP PERFORMED
Amphetamines: NOT DETECTED
Benzodiazepines: NOT DETECTED
Cocaine: POSITIVE — AB
Opiates: NOT DETECTED
Tetrahydrocannabinol: NOT DETECTED

## 2013-09-19 LAB — CREATININE, URINE, RANDOM: Creatinine, Urine: 131.57 mg/dL

## 2013-09-19 LAB — TROPONIN I
Troponin I: <0.3 ng/mL (ref <0.30)
Troponin I: <0.3 ng/mL (ref <0.30)

## 2013-09-19 LAB — OSMOLALITY, URINE: Osmolality, Ur: 545 mOsm/kg (ref 390–1090)

## 2013-09-19 MED ORDER — THIAMINE HCL 100 MG/ML IJ SOLN
100.0000 mg | Freq: Every day | INTRAMUSCULAR | Status: DC
  Filled 2013-09-19 (×3): qty 1

## 2013-09-19 MED ORDER — LORAZEPAM 2 MG/ML IJ SOLN: 1.0000 mg | Freq: Four times a day (QID) | INTRAMUSCULAR | Status: AC | PRN

## 2013-09-19 MED ORDER — CLONIDINE HCL 0.1 MG PO TABS
0.1000 mg | ORAL_TABLET | Freq: Four times a day (QID) | ORAL | Status: DC
  Administered 2013-09-19 (×3): 0.1 mg via ORAL
  Filled 2013-09-19 (×8): qty 1

## 2013-09-19 MED ORDER — INFLUENZA VAC SPLIT QUAD 0.5 ML IM SUSP
0.5000 mL | INTRAMUSCULAR | Status: DC
  Filled 2013-09-19: qty 0.5

## 2013-09-19 MED ORDER — LORAZEPAM 1 MG PO TABS
1.0000 mg | ORAL_TABLET | Freq: Four times a day (QID) | ORAL | Status: AC | PRN
  Administered 2013-09-19 – 2013-09-21 (×4): 1 mg via ORAL
  Filled 2013-09-19 (×4): qty 1

## 2013-09-19 MED ORDER — METHOCARBAMOL 500 MG PO TABS
500.0000 mg | ORAL_TABLET | Freq: Three times a day (TID) | ORAL | Status: DC | PRN
  Administered 2013-09-21: 500 mg via ORAL
  Filled 2013-09-19: qty 1

## 2013-09-19 MED ORDER — ASPIRIN 325 MG PO TABS
325.0000 mg | ORAL_TABLET | Freq: Every day | ORAL | Status: DC
  Administered 2013-09-19 – 2013-09-22 (×4): 325 mg via ORAL
  Filled 2013-09-19 (×4): qty 1

## 2013-09-19 MED ORDER — VITAMIN B-1 100 MG PO TABS
100.0000 mg | ORAL_TABLET | Freq: Every day | ORAL | Status: DC
  Administered 2013-09-19 – 2013-09-22 (×4): 100 mg via ORAL
  Filled 2013-09-19 (×4): qty 1

## 2013-09-19 MED ORDER — LOPERAMIDE HCL 2 MG PO CAPS
2.0000 mg | ORAL_CAPSULE | ORAL | Status: DC | PRN
  Filled 2013-09-19: qty 2

## 2013-09-19 MED ORDER — ACETAMINOPHEN 325 MG PO TABS
650.0000 mg | ORAL_TABLET | ORAL | Status: DC | PRN
  Administered 2013-09-20 – 2013-09-22 (×2): 650 mg via ORAL
  Filled 2013-09-19 (×2): qty 2

## 2013-09-19 MED ORDER — DICYCLOMINE HCL 20 MG PO TABS
20.0000 mg | ORAL_TABLET | Freq: Four times a day (QID) | ORAL | Status: DC | PRN
  Filled 2013-09-19: qty 1

## 2013-09-19 MED ORDER — LORAZEPAM 2 MG/ML IJ SOLN: 0.0000 mg | Freq: Two times a day (BID) | INTRAMUSCULAR | Status: DC

## 2013-09-19 MED ORDER — FOLIC ACID 1 MG PO TABS
1.0000 mg | ORAL_TABLET | Freq: Every day | ORAL | Status: DC
  Administered 2013-09-19 – 2013-09-22 (×4): 1 mg via ORAL
  Filled 2013-09-19 (×4): qty 1

## 2013-09-19 MED ORDER — CLONIDINE HCL 0.1 MG PO TABS: 0.1000 mg | ORAL_TABLET | Freq: Every day | ORAL | Status: DC

## 2013-09-19 MED ORDER — NAPROXEN 500 MG PO TABS
500.0000 mg | ORAL_TABLET | Freq: Two times a day (BID) | ORAL | Status: DC | PRN
  Filled 2013-09-19: qty 1

## 2013-09-19 MED ORDER — SODIUM CHLORIDE 0.9 % IV SOLN
INTRAVENOUS | Status: AC
  Administered 2013-09-19: 05:00:00 via INTRAVENOUS

## 2013-09-19 MED ORDER — LORAZEPAM 2 MG/ML IJ SOLN
0.0000 mg | Freq: Four times a day (QID) | INTRAMUSCULAR | Status: AC
  Administered 2013-09-20 (×2): 1 mg via INTRAVENOUS
  Administered 2013-09-21: 2 mg via INTRAVENOUS
  Filled 2013-09-19 (×3): qty 1

## 2013-09-19 MED ORDER — ONDANSETRON 4 MG PO TBDP
4.0000 mg | ORAL_TABLET | Freq: Four times a day (QID) | ORAL | Status: DC | PRN
  Administered 2013-09-19: 4 mg via ORAL
  Filled 2013-09-19: qty 1

## 2013-09-19 MED ORDER — HYDROXYZINE HCL 25 MG PO TABS
25.0000 mg | ORAL_TABLET | Freq: Four times a day (QID) | ORAL | Status: DC | PRN
  Administered 2013-09-21 – 2013-09-22 (×2): 25 mg via ORAL
  Filled 2013-09-19 (×2): qty 1

## 2013-09-19 MED ORDER — CLONIDINE HCL 0.1 MG PO TABS: 0.1000 mg | ORAL_TABLET | ORAL | Status: DC

## 2013-09-19 MED ORDER — ADULT MULTIVITAMIN W/MINERALS CH
1.0000 | ORAL_TABLET | Freq: Every day | ORAL | Status: DC
  Administered 2013-09-19 – 2013-09-22 (×4): 1 via ORAL
  Filled 2013-09-19 (×4): qty 1

## 2013-09-19 NOTE — H&P (Signed)
[facility]  Chief Complaint:  Chest pain  HPI: Curtis Blankenship is a 32 y.o. male   has a past medical history of Bipolar disorder; PTSD (post-traumatic stress disorder); and Depression.   Presented with  Patient has been drinking and using cocaine through out the day. Around midnight he started to have left arm heaviness, chest pain. Blurred vision in left eye. Left side of his head and face went numb. Patient called his mother and was brought to ER. His symptoms have improved.  States his last drink was at 9 PM 09/18/2013. He has hx of tremor in AM but never had DT's although he also has not been without alcohol for too long.  ER MD spoke to Neurology who would like full TIA work up and transfer to [facility].    Review of Systems:    Pertinent positives include: chest pain,  Tingling, loss ofsensation  Constitutional:  No weight loss, night sweats, Fevers, chills, fatigue, weight loss  HEENT:  No headaches, Difficulty swallowing,Tooth/dental problems,Sore throat,  No sneezing, itching, ear ache, nasal congestion, post nasal drip,  Cardio-vascular:  No Orthopnea, PND, anasarca, dizziness, palpitations.no Bilateral lower extremity swelling  GI:  No heartburn, indigestion, abdominal pain, nausea, vomiting, diarrhea, change in bowel habits, loss of appetite, melena, blood in stool, hematemesis Resp:  no shortness of breath at rest. No dyspnea on exertion, No excess mucus, no productive cough, No non-productive cough, No coughing up of blood.No change in color of mucus.No wheezing. Skin:  no rash or lesions. No jaundice GU:  no dysuria, change in color of urine, no urgency or frequency. No straining to urinate.  No flank pain.  Musculoskeletal:  No joint pain or no joint swelling. No decreased range of motion. No back pain.  Psych:  No change in mood or affect. No depression or anxiety. No memory loss.  Neuro: no localizing neurological complaints, no no weakness, no double vision, no gait  abnormality, no slurred speech, no confusion  Otherwise ROS are negative except for above, 10 systems were reviewed  Past Medical History: Past Medical History  Diagnosis Date  . Bipolar disorder   . PTSD (post-traumatic stress disorder)   . Depression    Past Surgical History  Procedure Laterality Date  . Appendectomy       Medications: Prior to Admission medications   Medication Sig Start Date End Date Taking? Authorizing Provider  aspirin 325 MG tablet Take 650 mg by mouth 2 (two) times daily as needed for mild pain or headache.   Yes Historical Provider, MD  diphenhydrAMINE (BENADRYL) 25 MG tablet Take 25 mg by mouth every 8 (eight) hours as needed for allergies.   Yes Historical Provider, MD    Allergies:  No Known Allergies  Social History:  Ambulatory   Independently  Lives at   home   reports that he has been smoking Cigarettes.  He has a 15 pack-year smoking history. He has quit using smokeless tobacco. His smokeless tobacco use included Snuff. He reports that he drinks about 97.2 ounces of alcohol per week. He reports that he uses illicit drugs (Cocaine, Other-see comments, and Benzodiazepines) about 3 times per week.   Family History: family history includes Alcoholism in his other; Hypertension in his mother.    Physical Exam: Patient Vitals for the past 24 hrs:  BP Temp Temp src Pulse Resp SpO2  09/19/13 0236 - 98.2 F (36.8 C) - - - -  09/19/13 0133 146/92 mmHg 98.2 F (36.8 C) Oral  107 20 98 %    1. General:  in No Acute distress 2. Psychological: Alert and  Oriented 3. Head/ENT:   Moist   Mucous Membranes                          Head Non traumatic, neck supple                          Normal  Dentition 4. SKIN:  decreased Skin turgor,  Skin clean Dry and intact no rash 5. Heart: Regular rate and rhythm no Murmur, Rub or gallop 6. Lungs: Clear to auscultation bilaterally, no wheezes or crackles   7. Abdomen: Soft, non-tender, Non distended 8.  Lower extremities: no clubbing, cyanosis, or edema 9. Neurologically strength 5/5 i all 4 ext, CN2-12 intact 10. MSK: Normal range of motion  body mass index is unknown because there is no weight on file.   Labs on Admission:   Recent Labs  09/19/13 0215  NA 131*  K 4.0  CL 93*  CO2 23  GLUCOSE 89  BUN 11  CREATININE 1.01  CALCIUM 9.9   No results found for this basename: AST, ALT, ALKPHOS, BILITOT, PROT, ALBUMIN,  in the last 72 hours No results found for this basename: LIPASE, AMYLASE,  in the last 72 hours  Recent Labs  09/19/13 0215  WBC 14.7*  NEUTROABS 11.8*  HGB 15.0  HCT 43.2  MCV 91.5  PLT 379    Recent Labs  09/19/13 0215  TROPONINI <0.30   No results found for this basename: TSH, T4TOTAL, FREET3, T3FREE, THYROIDAB,  in the last 72 hours No results found for this basename: VITAMINB12, FOLATE, FERRITIN, TIBC, IRON, RETICCTPCT,  in the last 72 hours No results found for this basename: HGBA1C    The CrCl is unknown because both a height and weight (above a minimum accepted value) are required for this calculation. ABG No results found for this basename: phart, pco2, po2, hco3, tco2, acidbasedef, o2sat     No results found for this basename: DDIMER     Other results:  I have pearsonaly reviewed this: ECG REPORT  Rate: 76  Rhythm: NSR ST&T Change: no ischemia   Cultures: No results found for this basename: sdes, specrequest, cult, reptstatus       Radiological Exams on Admission: Ct Head Wo Contrast  09/19/2013   CLINICAL DATA:  Left facial numbness with visual changes  EXAM: CT HEAD WITHOUT CONTRAST  TECHNIQUE: Contiguous axial images were obtained from the base of the skull through the vertex without intravenous contrast.  COMPARISON:  None.  FINDINGS: There is no acute intracranial hemorrhage or large vessel territory infarct. Gray-white matter differentiation is maintained. No mass or midline shift. No hydrocephalus. No extra-axial fluid  collection. Calvarium is intact.  Paranasal sinuses and mastoid air cells are clear.  IMPRESSION: Normal head CT with no acute intracranial process identified.   Electronically Signed   By: Rise Mu M.D.   On: 09/19/2013 02:20    Chart has been reviewed  Assessment/Plan  32 yo M with polysubstance abuse with with stroke like symptoms after using cocaine most likely due to vasospasm.   Present on Admission:  . Cocaine abuse - SW consult , vasospasms likely contributing to chest pain and neurological symptoms patient this point is interested in quitting  patient also endorses taking opiates and benzodiazepines whenever he had his hands on and  will initiate opioid withdrawal protocol  . Alcohol dependence - to put on CIWA protocol and monitor for good control. Patient had never had severe withdrawal in the past  . TIA (transient ischemic attack) - order MRI MRA echo and carotid Dopplers as part of a full workup most likely this is due to vasospasm due to cocaine abuse   Prophylaxis: SCD Protonix  CODE STATUS: FULL CODE  Other plan as per orders.  I have spent a total of 55 min on this admission  Abigaile Rossie 09/19/2013, 3:59 AM

## 2013-09-19 NOTE — Progress Notes (Signed)
Nutrition Brief Note  Patient identified on the Malnutrition Screening Tool (MST) Report  Wt Readings from Last 15 Encounters:  09/19/13 161 lb 9.6 oz (73.3 kg)  03/05/13 168 lb (76.204 kg)  12/04/12 175 lb (79.379 kg)  11/30/12 180 lb (81.647 kg)    Body mass index is 22.55 kg/(m^2). Patient meets criteria for normal weight based on current BMI.   Current diet order is heart healthy, patient is consuming approximately >50% of meals at this time. Labs and medications reviewed. Admitted with left arm heaviness, chest pain, and blurred vision in left eye with left side of head/face going numb. Hx of alcohol and cocaine use. Met with pt who reports eating when he wanted to PTA and thinks he may have lost weight recently but is unsure how much. C/o some nausea, notified RN. Reports appetite improving and has been eating over 50% of meals. Not interested in nutritional supplements. Getting thiamine and multivitamin.   No nutrition interventions warranted at this time. If nutrition issues arise, please consult RD.   Levon Hedger MS, RD, LDN [phone] Weekend/After Hours Pager

## 2013-09-19 NOTE — Progress Notes (Signed)
PT Cancellation Note  Patient Details Name: Curtis Blankenship MRN: [id number] DOB: 07-Nov-1980   Cancelled Treatment:    Reason Eval/Treat Not Completed: Medical issues which prohibited therapy. 12 hour bedrest orders active. Will need updated activity orders to proceed.   Fabio Asa 09/19/2013, 1:39 PM

## 2013-09-19 NOTE — Consult Note (Addendum)
Referring Physician: Adela Glimpse    Chief Complaint: Numbness and blurred vision  HPI: Curtis Blankenship is an 32 y.o. male who reports that he was drinking alcohol and doing cocaine throughout the day yesterday.  At about 1230 last night he experienced numbness on the left side of his face and in the left arm.  There was also numbness in the right leg.  The vision in his left eye became blurry.  He experienced chest pain as well.  The patient called his mother who brought him to the ED.  He reports that within 2-3 hours all of his symptoms had resolved other than some numbness that he continues to have on the left side of his face.    Date last known well: Date: 09/19/2013 Time last known well: Time: 00:30 tPA Given: No: Resolution of symptoms  Past Medical History  Diagnosis Date  . Bipolar disorder   . PTSD (post-traumatic stress disorder)   . Depression     Past Surgical History  Procedure Laterality Date  . Appendectomy      Family History  Problem Relation Age of Onset  . Hypertension Mother   . Alcoholism Other    Social History:  reports that he has been smoking Cigarettes.  He has a 15 pack-year smoking history. He has quit using smokeless tobacco. His smokeless tobacco use included Snuff. He reports that he drinks about 97.2 ounces of alcohol per week. He reports that he uses illicit drugs (Cocaine, Other-see comments, and Benzodiazepines) about 3 times per week. Has a history of IVDA as well.    Allergies: No Known Allergies  Medications:  I have reviewed the patient's current medications. Prior to Admission:  Prescriptions prior to admission  Medication Sig Dispense Refill  . aspirin 325 MG tablet Take 650 mg by mouth 2 (two) times daily as needed for mild pain or headache.      . diphenhydrAMINE (BENADRYL) 25 MG tablet Take 25 mg by mouth every 8 (eight) hours as needed for allergies.       Scheduled: . aspirin  325 mg Oral Daily  . cloNIDine  0.1 mg Oral QID   Followed by  . [START ON 09/21/2013] cloNIDine  0.1 mg Oral BH-qamhs   Followed by  . [START ON 09/23/2013] cloNIDine  0.1 mg Oral QAC breakfast  . folic acid  1 mg Oral Daily  . [START ON 09/20/2013] influenza vac split quadrivalent PF  0.5 mL Intramuscular Tomorrow-1000  . LORazepam  0-4 mg Intravenous Q6H   Followed by  . [START ON 09/21/2013] LORazepam  0-4 mg Intravenous Q12H  . multivitamin with minerals  1 tablet Oral Daily  . thiamine  100 mg Oral Daily   Or  . thiamine  100 mg Intravenous Daily    ROS: History obtained from the patient  General ROS: generalized fatigue Psychological ROS: negative for - behavioral disorder, hallucinations, memory difficulties, mood swings or suicidal ideation Ophthalmic ROS: as noted in HPI ENT ROS: negative for - epistaxis, nasal discharge, oral lesions, sore throat, tinnitus or vertigo Allergy and Immunology ROS: negative for - hives or itchy/watery eyes Hematological and Lymphatic ROS: negative for - bleeding problems, bruising or swollen lymph nodes Endocrine ROS: negative for - galactorrhea, hair pattern changes, polydipsia/polyuria or temperature intolerance Respiratory ROS: negative for - cough, hemoptysis, shortness of breath or wheezing Cardiovascular ROS: as noted in HPI Gastrointestinal ROS: negative for - abdominal pain, diarrhea, hematemesis, nausea/vomiting or stool incontinence Genito-Urinary ROS: negative for - dysuria,  hematuria, incontinence or urinary frequency/urgency Musculoskeletal ROS: negative for - joint swelling or muscular weakness Neurological ROS: as noted in HPI Dermatological ROS: negative for rash and skin lesion changes  Physical Examination: Blood pressure 133/72, pulse 81, temperature 97.9 F (36.6 C), temperature source Oral, resp. rate 20, height 5\' 11"  (1.803 m), weight 73.3 kg (161 lb 9.6 oz), SpO2 98.00%.  Neurologic Examination: Mental Status: Alert, oriented, thought content appropriate.  Speech  fluent without evidence of aphasia.  Able to follow 3 step commands without difficulty. Cranial Nerves: II: Discs flat bilaterally; Visual fields grossly normal, pupils equal, round, reactive to light and accommodation III,IV, VI: ptosis not present, extra-ocular motions intact bilaterally V,VII: smile symmetric, facial light touch sensation decreased in the lower part of the face on the left VIII: hearing normal bilaterally IX,X: gag reflex present XI: bilateral shoulder shrug XII: midline tongue extension Motor: Right : Upper extremity   5/5    Left:     Upper extremity   5/5  Lower extremity   5/5     Lower extremity   5/5 Tone and bulk:normal tone throughout; no atrophy noted Sensory: Pinprick and light touch intact throughout, bilaterally Deep Tendon Reflexes: 2+ and symmetric throughout Plantars: Right: downgoing   Left: downgoing Cerebellar: normal finger-to-nose and normal heel-to-shin test Gait: Unable to test CV: pulses palpable throughout    Laboratory Studies:  Basic Metabolic Panel:  Recent Labs Lab 09/19/13 0215  NA 131*  K 4.0  CL 93*  CO2 23  GLUCOSE 89  BUN 11  CREATININE 1.01  CALCIUM 9.9    Liver Function Tests:  Recent Labs Lab 09/19/13 0215  AST 26  ALT 18  ALKPHOS 92  BILITOT 0.7  PROT 8.0  ALBUMIN 4.7   No results found for this basename: LIPASE, AMYLASE,  in the last 168 hours No results found for this basename: AMMONIA,  in the last 168 hours  CBC:  Recent Labs Lab 09/19/13 0215  WBC 14.7*  NEUTROABS 11.8*  HGB 15.0  HCT 43.2  MCV 91.5  PLT 379    Cardiac Enzymes:  Recent Labs Lab 09/19/13 0215  TROPONINI <0.30    BNP: No components found with this basename: POCBNP,   CBG: No results found for this basename: GLUCAP,  in the last 168 hours  Microbiology: No results found for this or any previous visit.  Coagulation Studies: No results found for this basename: LABPROT, INR,  in the last 72 hours  Urinalysis:  No results found for this basename: COLORURINE, APPERANCEUR, LABSPEC, PHURINE, GLUCOSEU, HGBUR, BILIRUBINUR, KETONESUR, PROTEINUR, UROBILINOGEN, NITRITE, LEUKOCYTESUR,  in the last 168 hours  Lipid Panel: No results found for this basename: chol, trig, hdl, cholhdl, vldl, ldlcalc    HgbA1C:  No results found for this basename: HGBA1C    Urine Drug Screen:     Component Value Date/Time   LABOPIA NONE DETECTED 09/19/2013 0520   COCAINSCRNUR POSITIVE* 09/19/2013 0520   LABBENZ NONE DETECTED 09/19/2013 0520   AMPHETMU NONE DETECTED 09/19/2013 0520   THCU NONE DETECTED 09/19/2013 0520   LABBARB NONE DETECTED 09/19/2013 0520    Alcohol Level: No results found for this basename: ETH,  in the last 168 hours  Other results: EKG: sinus rhythm at 76 bpm.  Imaging: Ct Head Wo Contrast  09/19/2013   CLINICAL DATA:  Left facial numbness with visual changes  EXAM: CT HEAD WITHOUT CONTRAST  TECHNIQUE: Contiguous axial images were obtained from the base of the skull through the  vertex without intravenous contrast.  COMPARISON:  None.  FINDINGS: There is no acute intracranial hemorrhage or large vessel territory infarct. Gray-white matter differentiation is maintained. No mass or midline shift. No hydrocephalus. No extra-axial fluid collection. Calvarium is intact.  Paranasal sinuses and mastoid air cells are clear.  IMPRESSION: Normal head CT with no acute intracranial process identified.   Electronically Signed   By: Rise Mu M.D.   On: 09/19/2013 02:20    Assessment: 32 y.o. male with a history of polysubstance abuse who presents with complaints of numbness on the left side of the face, left arm and right leg.  Also complained of left eye visual disturbances as well.  Currently his only complaint is that of some paresthesias on the left side of the face.  Head CT has been reviewed and is unremarkable.  Further work up recommended.   Patient also seeks detox.    Stroke Risk Factors -  smoking  Plan: 1. HgbA1c, fasting lipid panel 2. MRI, MRA  of the brain without contrast 3. Echocardiogram 4. Carotid dopplers 5. Prophylactic therapy-Continue ASA daily 6. Risk factor modification 7. Telemetry monitoring 8. Frequent neuro checks 9. Psych consult and CIWA protocol  Thana Farr, MD [facility] [phone] 09/19/2013, 6:33 AM

## 2013-09-19 NOTE — ED Provider Notes (Signed)
CSN: [id number]     Arrival date & time 09/19/13  0129 History   First MD Initiated Contact with Patient 09/19/13 0138     Chief Complaint  Patient presents with  . Numbness    facial  . Visual Field Change    left side   (Consider location/radiation/quality/duration/timing/severity/associated sxs/prior Treatment) HPI 32 year old  male presents emergency department from home with complaint of left-sided numbness, blurred vision in the left eye, and transitory pain in chest and left arm tonight.  Symptoms started about an hour ago after using crack cocaine.  Pt took two aspirin and benadryl after sxs started. Symptoms have been waxing and waning since that time.  No prior history of same.  Patient has history of alcoholism, PTSD, depression, polysubstance abuse.  He is a poor historian.  He reports that after onset of his symptoms.  He went online and found that they were strokelike in nature, prompting him to come to the emergency room.  Patient reports blurred vision, just to the left periphery lasted a few seconds.  When walking up the stairs, he had a brief episode of chest pain, and left arm pain and heaviness.  Patient reports the most consistent symptom is numbness and a tingling sensation to the left side of his face and head.  He reports he has numbness and tingling to the left side of his tongue as well.  No facial droop, no weakness to arm or leg.  No vertigo, no difficulties with ambulation, no difficulties speaking thinking, or swallowing. Past Medical History  Diagnosis Date  . Bipolar disorder   . PTSD (post-traumatic stress disorder)   . Depression    Past Surgical History  Procedure Laterality Date  . Appendectomy     History reviewed. No pertinent family history. History  Substance Use Topics  . Smoking status: Current Every Day Smoker -- 1.50 packs/day for 10 years    Types: Cigarettes  . Smokeless tobacco: Former Neurosurgeon    Types: Snuff  . Alcohol Use: 97.2 oz/week   150 Cans of beer, 12 Shots of liquor per week     Comment: has been drinking daily since ran out of antabuse    Review of Systems  See History of Present Illness; otherwise all other systems are reviewed and negative Allergies  Review of patient's allergies indicates no known allergies.  Home Medications   Current Outpatient Rx  Name  Route  Sig  Dispense  Refill  . carbamazepine (TEGRETOL) 200 MG tablet   Oral   Take 1 tablet (200 mg total) by mouth 4 (four) times daily. For mood stabilization   120 tablet   0   . desvenlafaxine (PRISTIQ) 50 MG 24 hr tablet   Oral   Take 1 tablet (50 mg total) by mouth daily. For depression   30 tablet   0   . disulfiram (ANTABUSE) 250 MG tablet   Oral   Take 1 tablet (250 mg total) by mouth daily. For alcohol abstinence   30 tablet   0   . mirtazapine (REMERON) 30 MG tablet   Oral   Take 1 tablet (30 mg total) by mouth at bedtime. For depression/sleep   30 tablet   0   . traZODone (DESYREL) 50 MG tablet   Oral   Take 1 tablet (50 mg total) by mouth at bedtime as needed for sleep.   30 tablet   0   . zolpidem (AMBIEN) 10 MG tablet   Oral  Take 1 tablet (10 mg total) by mouth at bedtime as needed for sleep. For sleep.   30 tablet   0    BP 146/92  Pulse 107  Temp(Src) 98.2 F (36.8 C) (Oral)  Resp 20  SpO2 98% Physical Exam  Nursing note and vitals reviewed. Constitutional: He is oriented to person, place, and time. He appears well-developed and well-nourished.  HENT:  Head: Normocephalic and atraumatic.  Right Ear: External ear normal.  Left Ear: External ear normal.  Nose: Nose normal.  Mouth/Throat: Oropharynx is clear and moist.  Eyes: Conjunctivae and EOM are normal. Pupils are equal, round, and reactive to light.  Neck: Normal range of motion. Neck supple. No JVD present. No tracheal deviation present. No thyromegaly present.  Cardiovascular: Normal rate, regular rhythm, normal heart sounds and intact distal  pulses.  Exam reveals no gallop and no friction rub.   No murmur heard. Pulmonary/Chest: Effort normal and breath sounds normal. No stridor. No respiratory distress. He has no wheezes. He has no rales. He exhibits no tenderness.  Abdominal: Soft. Bowel sounds are normal. He exhibits no distension and no mass. There is no tenderness. There is no rebound and no guarding.  Musculoskeletal: Normal range of motion. He exhibits no edema and no tenderness.  Lymphadenopathy:    He has no cervical adenopathy.  Neurological: He is alert and oriented to person, place, and time. He has normal reflexes. No cranial nerve deficit. He exhibits normal muscle tone. Coordination normal.  Patient reports numbness and tingling to left side of face, but tactile sensation is normal.  When compared to the right  Skin: Skin is warm and dry. No rash noted. No erythema. No pallor.  Psychiatric: He has a normal mood and affect. His behavior is normal. Judgment and thought content normal.    ED Course  Procedures (including critical care time) Labs Review Labs Reviewed  CBC WITH DIFFERENTIAL - Abnormal; Notable for the following:    WBC 14.7 (*)    Neutrophils Relative % 80 (*)    Neutro Abs 11.8 (*)    All other components within normal limits  BASIC METABOLIC PANEL - Abnormal; Notable for the following:    Sodium 131 (*)    Chloride 93 (*)    All other components within normal limits  TROPONIN I  URINE RAPID DRUG SCREEN (HOSP PERFORMED)  HEPATIC FUNCTION PANEL   Imaging Review Ct Head Wo Contrast  09/19/2013   CLINICAL DATA:  Left facial numbness with visual changes  EXAM: CT HEAD WITHOUT CONTRAST  TECHNIQUE: Contiguous axial images were obtained from the base of the skull through the vertex without intravenous contrast.  COMPARISON:  None.  FINDINGS: There is no acute intracranial hemorrhage or large vessel territory infarct. Gray-white matter differentiation is maintained. No mass or midline shift. No  hydrocephalus. No extra-axial fluid collection. Calvarium is intact.  Paranasal sinuses and mastoid air cells are clear.  IMPRESSION: Normal head CT with no acute intracranial process identified.   Electronically Signed   By: Rise Mu M.D.   On: 09/19/2013 02:20    EKG Interpretation    Date/Time:  Saturday September 19 2013 02:29:25 EST Ventricular Rate:  76 PR Interval:  116 QRS Duration: 92 QT Interval:  377 QTC Calculation: 424 R Axis:   72 Text Interpretation:  Sinus rhythm Borderline short PR interval ST elev, probable normal early repol pattern Confirmed by Tina Gruner  MD, Grover Woodfield ([id number]) on 09/19/2013 2:40:11 AM  MDM   1. Stroke-like symptoms   2. Polysubstance abuse   3. Cocaine abuse   4. Alcohol dependence    32 year old male with neurologic changes, and brief chest pain after using cocaine.  Concern for ischemia, or vasospasm.  Exam is unremarkable.  We'll check EKGs.  Labs and CT head.  2:38 AM D/w Dr Thad Ranger from neurology who recommends observation overnight at [facility] for worsening of symptoms.  Will complete w/u here and d/w hospitalist.    Olivia Mackie, MD 09/19/13 [phone]

## 2013-09-19 NOTE — Progress Notes (Signed)
OT Cancellation Note  Patient Details Name: Curtis Blankenship MRN: [id number] DOB: 09-03-1981   Cancelled Treatment:    Reason Eval/Treat Not Completed: Medical issues which prohibited therapy 12 hour bedrest orders active. Will need updated activity orders to proceed.   Earlie Raveling OTR/L [phone] 09/19/2013, 2:27 PM

## 2013-09-19 NOTE — Progress Notes (Signed)
  Echocardiogram 2D Echocardiogram has been performed.  [place] 09/19/2013, 3:27 PM

## 2013-09-19 NOTE — Progress Notes (Signed)
VASCULAR LAB PRELIMINARY  PRELIMINARY  PRELIMINARY  PRELIMINARY  Carotid duplex  completed.    Preliminary report:  Bilateral:  1-39% ICA stenosis.  Vertebral artery flow is antegrade.      Raechal Raben, RVT 09/19/2013, 9:50 AM

## 2013-09-19 NOTE — Progress Notes (Signed)
Patient seen examined. Agree with current plan.  Patient pending MRI, carotid Doppler study, echocardiogram for rule out TIA. Patient also has a history of polysubstance abuse and is currently on withdrawal protocol. Will consult case management for possible outpatient rehabilitation for his drug and alcohol abuse.

## 2013-09-19 NOTE — ED Notes (Signed)
Patient is alert and oriented x3.  He is complaining of left sided facial numbness and vision changes.   Patient admits to use of cocaine about an hours ago.

## 2013-09-20 ENCOUNTER — Observation Stay (HOSPITAL_COMMUNITY): Payer: Medicare Other

## 2013-09-20 LAB — CBC
HCT: 41.4 % (ref 39.0–52.0)
Hemoglobin: 13.7 g/dL (ref 13.0–17.0)
MCV: 97 fL (ref 78.0–100.0)
Platelets: 285 10*3/uL (ref 150–400)
RBC: 4.27 MIL/uL (ref 4.22–5.81)
WBC: 6.7 10*3/uL (ref 4.0–10.5)

## 2013-09-20 MED ORDER — NICOTINE 14 MG/24HR TD PT24
14.0000 mg | MEDICATED_PATCH | Freq: Every day | TRANSDERMAL | Status: DC
  Administered 2013-09-21 (×2): 14 mg via TRANSDERMAL
  Filled 2013-09-20 (×3): qty 1

## 2013-09-20 NOTE — Progress Notes (Signed)
OT Cancellation Note  Patient Details Name: [name] MRN: [id number] DOB: Jul 31, 1981   Cancelled Treatment:    Reason Eval/Treat Not Completed: OT screened, no needs identified, will sign off  Spoke with PT and pt does not have any current OT needs. OT to sign off.   Earlie Raveling OTR/L [phone] 09/20/2013, 1:26 PM

## 2013-09-20 NOTE — Progress Notes (Addendum)
NEURO HOSPITALIST PROGRESS NOTE   SUBJECTIVE:                                                                                                                        Offers no neurological complains. Stated that the numbness of his left side is gone. TTE, CUS unremarkable. MRI brain pending.  OBJECTIVE:                                                                                                                           Vital signs in last 24 hours: Temp:  [97.4 F (36.3 C)-98.2 F (36.8 C)] 97.4 F (36.3 C) (11/23 0554) Pulse Rate:  [57-75] 57 (11/23 0554) Resp:  [20] 20 (11/23 0554) BP: (98-119)/(45-87) 98/45 mmHg (11/23 0554) SpO2:  [98 %-100 %] 99 % (11/23 0554)  Intake/Output from previous day: 11/22 0701 - 11/23 0700 In: 600 [P.O.:600] Out: 600 [Urine:600] Intake/Output this shift:   Nutritional status: Cardiac  Past Medical History  Diagnosis Date  . Bipolar disorder   . PTSD (post-traumatic stress disorder)   . Depression     Neurologic Exam:  Mental Status: Alert, oriented, thought content appropriate.  Speech fluent without evidence of aphasia.  Able to follow 3 step commands without difficulty. Cranial Nerves: II: Discs flat bilaterally; Visual fields grossly normal, pupils equal, round, reactive to light and accommodation III,IV, VI: ptosis not present, extra-ocular motions intact bilaterally V,VII: smile symmetric, facial light touch sensation normal bilaterally VIII: hearing normal bilaterally IX,X: gag reflex present XI: bilateral shoulder shrug XII: midline tongue extension without atrophy or fasciculations  Motor: Right : Upper extremity   5/5    Left:     Upper extremity   5/5  Lower extremity   5/5     Lower extremity   5/5 Tone and bulk:normal tone throughout; no atrophy noted Sensory: Pinprick and light touch intact throughout, bilaterally Deep Tendon Reflexes:  Right: Upper Extremity   Left: Upper  extremity   biceps (C-5 to C-6) 2/4   biceps (C-5 to C-6) 2/4 tricep (C7) 2/4    triceps (C7) 2/4 Brachioradialis (C6) 2/4  Brachioradialis (C6) 2/4  Lower Extremity Lower Extremity  quadriceps (L-2 to L-4) 2/4   quadriceps (L-2 to  L-4) 2/4 Achilles (S1) 2/4   Achilles (S1) 2/4  Plantars: Right: downgoing   Left: downgoing Cerebellar: normal finger-to-nose,  normal heel-to-shin test Gait: No tested CV: pulses palpable throughout    Lab Results: Lab Results  Component Value Date/Time   CHOL 181 09/19/2013  9:40 AM   Lipid Panel  Recent Labs  09/19/13 0940  CHOL 181  TRIG 153*  HDL 51  CHOLHDL 3.5  VLDL 31  LDLCALC 99    Studies/Results: Ct Head Wo Contrast  09/19/2013   CLINICAL DATA:  Left facial numbness with visual changes  EXAM: CT HEAD WITHOUT CONTRAST  TECHNIQUE: Contiguous axial images were obtained from the base of the skull through the vertex without intravenous contrast.  COMPARISON:  None.  FINDINGS: There is no acute intracranial hemorrhage or large vessel territory infarct. Gray-white matter differentiation is maintained. No mass or midline shift. No hydrocephalus. No extra-axial fluid collection. Calvarium is intact.  Paranasal sinuses and mastoid air cells are clear.  IMPRESSION: Normal head CT with no acute intracranial process identified.   Electronically Signed   By: Rise Mu M.D.   On: 09/19/2013 02:20    MEDICATIONS                                                                                                                       I have reviewed the patient's current medications.  ASSESSMENT/PLAN:                                                                                                           32 y.o. male with a history of polysubstance abuse who presents with complaints of numbness on the left side of the face, left arm and right leg. Also complained of left eye visual disturbances as well. Currently asymptomatic from a  neurological standpoint. Awaiting MRI brain.  Curtis Portela, MD [facility] [phone]  09/20/2013, 8:51 AM Addendum: MRI/MRA brain normal. Patient asymptomatic from a neuro-standpoint. No further neurological intervention needed. Will sign off.  Curtis Portela, MD

## 2013-09-20 NOTE — Progress Notes (Addendum)
[facility]                                                                                Patient Demographics  Curtis Blankenship, is a 32 y.o. male, DOB - 12/21/1980, ZOX:[id number]  Admit date - 09/19/2013   Admitting Physician Therisa Doyne, MD  Outpatient Primary MD for the patient is Provider Not In System  LOS - 1   Chief Complaint  Patient presents with  . Numbness    facial  . Visual Field Change    left side        Assessment & Plan    Active Problems:   Alcohol dependence   Cocaine abuse   TIA (transient ischemic attack)   Disturbance of skin sensation  Numbness and blurry vision, rule out TIA -CT of the head was negative. -Echocardiogram showed EF 55-65% with no PFO noted. -Carotid Doppler: 1-39% ICA stenosis. Vertebral artery flow is antegrade.  -Neurology consult -Pending MRI -Numbness improved vision resolved  Polysubstance abuse -Currently on withdrawal protocol -Patient has been counseled -Will speak to case management regarding possible rehabilitation options  Possible Delirium Tremens -Possible withdrawal of benzo, cocaine, and opioids -Continue CIWA  Code Status: Full  Family Communication: None  Disposition Plan: Admitted   Procedures Echocardiogram Study Conclusions - Left ventricle: The cavity size was normal. Wall thickness was normal. Systolic function was normal. The estimated ejection fraction was in the range of 55% to 65%. Wall motion was normal; there were no regional wall motion abnormalities. Left ventricular diastolic function  parameters were normal. No evidence of thrombus. - Atrial septum: No defect or patent foramen ovale was identified. - Pulmonic valve: Mild regurgitation.  Carotid doppler Preliminary report: Bilateral: 1-39% ICA stenosis. Vertebral artery flow is antegrade.   Consults   Neurology  DVT Prophylaxis  SCDs   Lab Results  Component Value Date   PLT 285 09/20/2013     Medications  Scheduled Meds: . aspirin  325 mg Oral Daily  . cloNIDine  0.1 mg Oral QID   Followed by  . [START ON 09/21/2013] cloNIDine  0.1 mg Oral BH-qamhs   Followed by  . [START ON 09/23/2013] cloNIDine  0.1 mg Oral QAC breakfast  . folic acid  1 mg Oral Daily  . influenza vac split quadrivalent PF  0.5 mL Intramuscular Tomorrow-1000  . LORazepam  0-4 mg Intravenous Q6H   Followed by  . [START ON 09/21/2013] LORazepam  0-4 mg Intravenous Q12H  . multivitamin with minerals  1 tablet Oral Daily  . thiamine  100 mg Oral Daily   Or  . thiamine  100 mg Intravenous Daily   Continuous Infusions:  PRN Meds:.acetaminophen, dicyclomine, hydrOXYzine, loperamide, LORazepam, LORazepam, methocarbamol, naproxen, ondansetron  Antibiotics   Anti-infectives   None       Time Spent in minutes   30 minutes   Curtis Blankenship D.O. on 09/20/2013 at 10:33 AM  Between 7am to 7pm - Pager - [phone]  After 7pm go to [email or web address] - password [email or web address]  And look for the night coverage person covering for me after hours  [facility] Office  [phone]    Subjective:   Curtis Blankenship  seen and examined today. Patient no longer complains of numbness or blurry vision. Does state that he has felt shaky and somewhat hot. Patient denies dizziness, chest pain, shortness of breath, abdominal pain, N/V/D/C, new weakness, numbess, tingling.    Objective:   Filed Vitals:   09/19/13 2123 09/20/13 0124 09/20/13 0554 09/20/13 0952  BP: 119/87 104/61 98/45 122/69  Pulse: 75 58 57 72  Temp: 97.8 F (36.6 C) 97.8 F (36.6 C) 97.4 F (36.3 C) 97.7 F (36.5 C)  TempSrc: Oral Oral Oral Oral  Resp: 20 20 20 20   Height:      Weight:      SpO2: 98% 100% 99% 100%    Wt Readings from Last 3 Encounters:  09/19/13 73.3 kg (161 lb 9.6 oz)  03/05/13 76.204 kg (168 lb)  12/04/12 79.379 kg (175 lb)     Intake/Output Summary (Last 24 hours) at 09/20/13 1033 Last data filed at  09/19/13 1700  Gross per 24 hour  Intake    600 ml  Output    600 ml  Net      0 ml    Exam  General: Well developed, well nourished, NAD, appears stated age  HEENT: NCAT, PERRLA, EOMI, Anicteic Sclera, mucous membranes moist. No pharyngeal erythema or exudates  Neck: Supple, no JVD, no masses  Cardiovascular: S1 S2 auscultated, no rubs, murmurs or gallops. Regular rate and rhythm.  Respiratory: Clear to auscultation bilaterally with equal chest rise  Abdomen: Soft, nontender, nondistended, + bowel sounds  Extremities: warm dry without cyanosis clubbing or edema  Neuro: AAOx3, cranial nerves grossly intact. Strength 5/5 in patient's upper and lower extremities bilaterally  Skin: Without rashes exudates or nodules  Psych: Normal affect and demeanor with intact judgement and insight  Data Review   Micro Results No results found for this or any previous visit (from the past 240 hour(s)).  Radiology Reports Ct Head Wo Contrast  09/19/2013   CLINICAL DATA:  Left facial numbness with visual changes  EXAM: CT HEAD WITHOUT CONTRAST  TECHNIQUE: Contiguous axial images were obtained from the base of the skull through the vertex without intravenous contrast.  COMPARISON:  None.  FINDINGS: There is no acute intracranial hemorrhage or large vessel territory infarct. Gray-white matter differentiation is maintained. No mass or midline shift. No hydrocephalus. No extra-axial fluid collection. Calvarium is intact.  Paranasal sinuses and mastoid air cells are clear.  IMPRESSION: Normal head CT with no acute intracranial process identified.   Electronically Signed   By: Rise Mu M.D.   On: 09/19/2013 02:20    CBC  Recent Labs Lab 09/19/13 0215 09/20/13 0745  WBC 14.7* 6.7  HGB 15.0 13.7  HCT 43.2 41.4  PLT 379 285  MCV 91.5 97.0  MCH 31.8 32.1  MCHC 34.7 33.1  RDW 13.0 13.4  LYMPHSABS 2.0  --   MONOABS 0.8  --   EOSABS 0.0  --   BASOSABS 0.1  --     Chemistries    Recent Labs Lab 09/19/13 0215  NA 131*  K 4.0  CL 93*  CO2 23  GLUCOSE 89  BUN 11  CREATININE 1.01  CALCIUM 9.9  AST 26  ALT 18  ALKPHOS 92  BILITOT 0.7   ------------------------------------------------------------------------------------------------------------------ estimated creatinine clearance is 110.9 ml/min (by C-G formula based on Cr of 1.01). ------------------------------------------------------------------------------------------------------------------  Recent Labs  09/19/13 0940  HGBA1C 5.0   ------------------------------------------------------------------------------------------------------------------  Recent Labs  09/19/13 0940  CHOL 181  HDL 51  LDLCALC 99  TRIG 153*  CHOLHDL 3.5   ------------------------------------------------------------------------------------------------------------------ No results found for this basename: TSH, T4TOTAL, FREET3, T3FREE, THYROIDAB,  in the last 72 hours ------------------------------------------------------------------------------------------------------------------ No results found for this basename: VITAMINB12, FOLATE, FERRITIN, TIBC, IRON, RETICCTPCT,  in the last 72 hours  Coagulation profile No results found for this basename: INR, PROTIME,  in the last 168 hours  No results found for this basename: DDIMER,  in the last 72 hours  Cardiac Enzymes  Recent Labs Lab 09/19/13 0215 09/19/13 1308 09/19/13 1955  TROPONINI <0.30 <0.30 <0.30   ------------------------------------------------------------------------------------------------------------------ No components found with this basename: POCBNP,

## 2013-09-20 NOTE — Progress Notes (Signed)
Utilization Review Completed.Curtis Blankenship T11/23/2014  

## 2013-09-20 NOTE — Progress Notes (Signed)
PT Cancellation Note  Patient Details Name: Curtis Blankenship MRN: [id number] DOB: May 28, 1981   Cancelled Treatment:    Reason Eval/Treat Not Completed: PT screened, no needs identified, will sign off (Pt gross mobility is Independent/unimpaired).  Observed in room and hallways. Per patient, likely d/c to behavioral health. No follow up needed.   Narda Amber [facility] 09/20/2013, 10:04 AM

## 2013-09-21 DIAGNOSIS — Z9119 Patient's noncompliance with other medical treatment and regimen: Secondary | ICD-10-CM

## 2013-09-21 DIAGNOSIS — F1920 Other psychoactive substance dependence, uncomplicated: Secondary | ICD-10-CM

## 2013-09-21 MED ORDER — ADULT MULTIVITAMIN W/MINERALS CH: 1.0000 | ORAL_TABLET | Freq: Every day | ORAL | Status: DC

## 2013-09-21 MED ORDER — FOLIC ACID 1 MG PO TABS: 1.0000 mg | ORAL_TABLET | Freq: Every day | ORAL | Status: DC

## 2013-09-21 MED ORDER — THIAMINE HCL 100 MG PO TABS: 100.0000 mg | ORAL_TABLET | Freq: Every day | ORAL | Status: DC

## 2013-09-21 MED ORDER — NICOTINE 14 MG/24HR TD PT24: 14.0000 mg | MEDICATED_PATCH | Freq: Every day | TRANSDERMAL | Status: DC

## 2013-09-21 MED ORDER — DICYCLOMINE HCL 20 MG PO TABS: 20.0000 mg | ORAL_TABLET | Freq: Four times a day (QID) | ORAL | Status: DC | PRN

## 2013-09-21 MED ORDER — VENLAFAXINE HCL ER 75 MG PO CP24
75.0000 mg | ORAL_CAPSULE | Freq: Every day | ORAL | Status: DC
  Administered 2013-09-22: 75 mg via ORAL
  Filled 2013-09-21 (×2): qty 1

## 2013-09-21 MED ORDER — HYDROXYZINE HCL 25 MG PO TABS: 25.0000 mg | ORAL_TABLET | Freq: Four times a day (QID) | ORAL | Status: DC | PRN

## 2013-09-21 MED ORDER — METHOCARBAMOL 500 MG PO TABS: 500.0000 mg | ORAL_TABLET | Freq: Three times a day (TID) | ORAL | Status: DC | PRN

## 2013-09-21 MED ORDER — CHLORDIAZEPOXIDE HCL 10 MG PO CAPS: ORAL_CAPSULE | ORAL | Status: DC

## 2013-09-21 NOTE — Progress Notes (Signed)
Clinical Social Work Department BRIEF PSYCHOSOCIAL ASSESSMENT 09/21/2013  Patient:  Curtis Blankenship, Curtis Blankenship     Account Number:  [id number]     Admit date:  09/19/2013  Clinical Social Worker:  Leron Croak, CLINICAL SOCIAL WORKER  Date/Time:  09/21/2013 03:50 PM  Referred by:  Physician  Date Referred:  09/21/2013 Referred for  Substance Abuse  Other - See comment   Other Referral:   Pt with severe PTSD that has led Pt to substance abuse   Interview type:  Patient Other interview type:    PSYCHOSOCIAL DATA Living Status:  ALONE Admitted from facility:   Level of care:   Primary support name:  Curtis Blankenship   [phone] Primary support relationship to patient:  PARENT Degree of support available:   Pt has some support from parents    CURRENT CONCERNS Current Concerns  Substance Abuse   Other Concerns:    SOCIAL WORK ASSESSMENT / PLAN CSW met with the Pt at the bedside to discuss consult for substance abuse. Pt does agree to assessment. CSW introduced self and purpose for visit. Pt is alert and oriented x4. Pt stated that he is in the hospital for alcohol detox and that he "would like to receive inpt drug rehab and detox". Pt stated that he "has been over to [facility] on several occasions and has received inpt rehab through [facility] administration and also in [place] (45 day PTSD treatment)". Pt is interested in recovery and stated that he "does intend to go into a long term treatment program to increase his changes for long term recovery. Pt gave permission for CSW to send Pt information to [facility] and also to [facility] for treatment.    Pt stated that he has been using for a "long time" but did not state an exact length of time of his drug use. Pt did state that his drug of choice is "alcohol and that he has 3 DUI's with no current warrants".    Pt also has a Hx of using cocaine and stated that he "usually uses alcohol with stimulants."    Per chart the Pt has a Hx of Bipolar, Depression, and PTSD.,  Pt served in combat in 2005/2006.    Pt is 100% disabled veteran and receives services at the [employer]. Pt has received inpt drug rehab through the [facility] and if able would like to go to the inpt rehab in [place], [place].    CSW referred Pt to inpt [facility] for review for possible admission into their program for [facility] treatment. Pt is agreeable to this, but does know that he is up for d/c and may need to make a decision for d/c plan if these options are not viable.    CSW to follow for placement and d/c planning.   Assessment/plan status:  Information/Referral to [facility] Other assessment/ plan:   Information/referral to community resources:   CSW provided a listing of rehab facilities and drug addiction resource.    PATIENT'S/FAMILY'S RESPONSE TO PLAN OF CARE: Pt was appreciative for assistance. Pt stated that he needs treatment and would like placement, however does have an alternate plan if unable to d/c to [facility] or [facility].        Leron Croak [facility]  [address];  [phone] Phone: [phone]

## 2013-09-21 NOTE — Discharge Summary (Signed)
Physician Discharge Summary  Curtis Blankenship NWG:[id number] DOB: 1981-02-17 DOA: 09/19/2013  PCP: Provider Not In System  Admit date: 09/19/2013 Discharge date: 09/21/2013  Time spent: 45 minutes  Recommendations for Outpatient Follow-up:  Patient should followup with his primary care physician within one week of discharge. He will be discharged to [facility].  Patient was given a Librium taper and to continue this.  Discharge Diagnoses:  Active Problems:   Alcohol dependence   Cocaine abuse   TIA (transient ischemic attack)   Disturbance of skin sensation   Discharge Condition: Stable  Diet recommendation: Regular  Filed Weights   09/19/13 0607  Weight: 73.3 kg (161 lb 9.6 oz)    History of present illness:  Curtis Blankenship is a 32 y.o. male  has a past medical history of Bipolar disorder; PTSD (post-traumatic stress disorder); and Depression. Patient has been drinking and using cocaine through out the day. Around midnight he started to have left arm heaviness, chest pain. Blurred vision in left eye. Left side of his head and face went numb. Patient called his mother and was brought to ER. His symptoms have improved.  States his last drink was at 9 PM 09/18/2013. He has hx of tremor in AM but never had DT's although he also has not been without alcohol for too long. ER MD spoke to Neurology who would like full TIA work up and transfer to [facility].   Hospital Course:  Is a 32 year old male history of bipolar disorder, posterior back stress disorder, depression and polysubstance abuse that presents emergency department with complaints of left arm heaviness as well as blurred vision in the left eye. Upon arrival to the emergency room patient did state that his symptoms had improved. Patient does have a history of polysubstance abuse and stated that his last drink was approximately 09/18/2013 at 9 PM.  Patient was admitted for possible TIA. CT of the head was negative. His echocardiogram did show  an EF of 55-65% with no PFO noted. Carotid Dopplers was also conducted showing a 1-39% ICA stenosis with vertebral artery flow is antegrade. Neurology was also consulted and following the patient. Patient's MRI was done showing no acute intracranial abnormalities. MRA was also negative. Patient patient's numbness as well as blurry vision had subsided upon the admission. Social work was consulted for possible rehabilitation placement and 8 with this patient due to his polysubstance abuse including cocaine, benzos, opioids and alcohol. He was placed on CIWA protocol. He will be discharged with a Librium taper. During his hospital stay patient was noted to have some symptoms of withdrawal on did have some delirium tremens which have improved.  Procedures: Echocardiogram  Study Conclusions - Left ventricle: The cavity size was normal. Wall thickness was normal. Systolic function was normal. The estimated ejection fraction was in the range of 55% to 65%. Wall motion was normal; there were no regional wall motion abnormalities. Left ventricular diastolic function parameters were normal. No evidence of thrombus. - Atrial septum: No defect or patent foramen ovale was identified. - Pulmonic valve: Mild regurgitation.  Carotid doppler  Preliminary report: Bilateral: 1-39% ICA stenosis. Vertebral artery flow is antegrade.   Consultations: Neurology  Discharge Exam: Filed Vitals:   09/21/13 1021  BP: 129/85  Pulse: 53  Temp: 97.3 F (36.3 C)  Resp: 18    Exam  General: Well developed, well nourished, NAD, appears stated age  HEENT: NCAT, PERRLA, EOMI, Anicteic Sclera, mucous membranes moist. No pharyngeal erythema or exudates  Neck: Supple, no  JVD, no masses  Cardiovascular: S1 S2 auscultated, no rubs, murmurs or gallops. Regular rate and rhythm.  Respiratory: Clear to auscultation bilaterally with equal chest rise  Abdomen: Soft, nontender, nondistended, + bowel sounds  Extremities: warm dry  without cyanosis clubbing or edema  Neuro: AAOx3, cranial nerves grossly intact. Strength 5/5 in patient's upper and lower extremities bilaterally  Skin: Without rashes exudates or nodules  Psych: Normal affect and demeanor with intact judgement and insight  Discharge Instructions     Medication List         aspirin 325 MG tablet  Take 650 mg by mouth 2 (two) times daily as needed for mild pain or headache.     chlordiazePOXIDE 10 MG capsule  Commonly known as:  LIBRIUM  - Day 1 and 2: Take 3 pills.    - Day 3 and 4: Take 2 pills.  - Day 5 and 6: Take 1 pill.  - Day 7 stop.     dicyclomine 20 MG tablet  Commonly known as:  BENTYL  Take 1 tablet (20 mg total) by mouth every 6 (six) hours as needed for spasms (abdominal cramping).     diphenhydrAMINE 25 MG tablet  Commonly known as:  BENADRYL  Take 25 mg by mouth every 8 (eight) hours as needed for allergies.     folic acid 1 MG tablet  Commonly known as:  FOLVITE  Take 1 tablet (1 mg total) by mouth daily.     hydrOXYzine 25 MG tablet  Commonly known as:  ATARAX/VISTARIL  Take 1 tablet (25 mg total) by mouth every 6 (six) hours as needed for anxiety.     methocarbamol 500 MG tablet  Commonly known as:  ROBAXIN  Take 1 tablet (500 mg total) by mouth every 8 (eight) hours as needed for muscle spasms.     multivitamin with minerals Tabs tablet  Take 1 tablet by mouth daily.     nicotine 14 mg/24hr patch  Commonly known as:  NICODERM CQ - dosed in mg/24 hours  Place 1 patch (14 mg total) onto the skin daily.     thiamine 100 MG tablet  Take 1 tablet (100 mg total) by mouth daily.       No Known Allergies    The results of significant diagnostics from this hospitalization (including imaging, microbiology, ancillary and laboratory) are listed below for reference.    Significant Diagnostic Studies: Ct Head Wo Contrast  09/19/2013   CLINICAL DATA:  Left facial numbness with visual changes  EXAM: CT HEAD  WITHOUT CONTRAST  TECHNIQUE: Contiguous axial images were obtained from the base of the skull through the vertex without intravenous contrast.  COMPARISON:  None.  FINDINGS: There is no acute intracranial hemorrhage or large vessel territory infarct. Gray-white matter differentiation is maintained. No mass or midline shift. No hydrocephalus. No extra-axial fluid collection. Calvarium is intact.  Paranasal sinuses and mastoid air cells are clear.  IMPRESSION: Normal head CT with no acute intracranial process identified.   Electronically Signed   By: Rise Mu M.D.   On: 09/19/2013 02:20   Mri Brain Without Contrast  09/20/2013   CLINICAL DATA:  Numbness left face and arm. Numbness right leg. Rule out stroke.  EXAM: MRI HEAD WITHOUT CONTRAST  MRA HEAD WITHOUT CONTRAST  TECHNIQUE: Multiplanar, multiecho pulse sequences of the brain and surrounding structures were obtained without intravenous contrast. Angiographic images of the head were obtained using MRA technique without contrast.  COMPARISON:  CT head  09/19/2013.  FINDINGS: MRI HEAD FINDINGS  Ventricle size is normal. Craniocervical junction is normal. Pituitary is normal in size.  Negative for acute or chronic infarct. Negative for demyelinating disease. Cerebral white matter is normal. Basal ganglia and brainstem are normal.  Negative for intracranial hemorrhage or fluid collection. Negative for mass or edema.  Paranasal sinuses are clear.  MRA HEAD FINDINGS  Both vertebral arteries are patent to the basilar. Posterior inferior cerebellar artery is patent on the left but not visualized on the right. There is a dominant right AICA. Basilar is widely patent. Superior cerebellar and posterior cerebral arteries are patent bilaterally.  Internal carotid artery is widely patent bilaterally. Anterior and middle cerebral arteries are patent bilaterally.  Negative for cerebral aneurysm.  IMPRESSION: MRI of the brain is normal.  Normal MRA of the head.    Electronically Signed   By: Marlan Palau M.D.   On: 09/20/2013 13:33   Mr Maxine Glenn Head/brain Wo Cm  09/20/2013   CLINICAL DATA:  Numbness left face and arm. Numbness right leg. Rule out stroke.  EXAM: MRI HEAD WITHOUT CONTRAST  MRA HEAD WITHOUT CONTRAST  TECHNIQUE: Multiplanar, multiecho pulse sequences of the brain and surrounding structures were obtained without intravenous contrast. Angiographic images of the head were obtained using MRA technique without contrast.  COMPARISON:  CT head 09/19/2013.  FINDINGS: MRI HEAD FINDINGS  Ventricle size is normal. Craniocervical junction is normal. Pituitary is normal in size.  Negative for acute or chronic infarct. Negative for demyelinating disease. Cerebral white matter is normal. Basal ganglia and brainstem are normal.  Negative for intracranial hemorrhage or fluid collection. Negative for mass or edema.  Paranasal sinuses are clear.  MRA HEAD FINDINGS  Both vertebral arteries are patent to the basilar. Posterior inferior cerebellar artery is patent on the left but not visualized on the right. There is a dominant right AICA. Basilar is widely patent. Superior cerebellar and posterior cerebral arteries are patent bilaterally.  Internal carotid artery is widely patent bilaterally. Anterior and middle cerebral arteries are patent bilaterally.  Negative for cerebral aneurysm.  IMPRESSION: MRI of the brain is normal.  Normal MRA of the head.   Electronically Signed   By: Marlan Palau M.D.   On: 09/20/2013 13:33    Microbiology: No results found for this or any previous visit (from the past 240 hour(s)).   Labs: Basic Metabolic Panel:  Recent Labs Lab 09/19/13 0215  NA 131*  K 4.0  CL 93*  CO2 23  GLUCOSE 89  BUN 11  CREATININE 1.01  CALCIUM 9.9   Liver Function Tests:  Recent Labs Lab 09/19/13 0215  AST 26  ALT 18  ALKPHOS 92  BILITOT 0.7  PROT 8.0  ALBUMIN 4.7   No results found for this basename: LIPASE, AMYLASE,  in the last 168  hours No results found for this basename: AMMONIA,  in the last 168 hours CBC:  Recent Labs Lab 09/19/13 0215 09/20/13 0745  WBC 14.7* 6.7  NEUTROABS 11.8*  --   HGB 15.0 13.7  HCT 43.2 41.4  MCV 91.5 97.0  PLT 379 285   Cardiac Enzymes:  Recent Labs Lab 09/19/13 0215 09/19/13 1308 09/19/13 1955  TROPONINI <0.30 <0.30 <0.30   BNP: BNP (last 3 results) No results found for this basename: PROBNP,  in the last 8760 hours CBG:  Recent Labs Lab 09/19/13 0632 09/19/13 1615  GLUCAP 123* 91       Signed:  Azir Muzyka  [facility]  09/21/2013, 10:27 AM

## 2013-09-21 NOTE — Consult Note (Signed)
Reason for Consult: polysubstance abuse Referring Physician: Edsel Petrin, DO   Curtis Blankenship is an 32 y.o. male.  [name] was seen and chart reviewed. Patient reported he was in [employer] between 2003 - 2008, deployed to in [place] and also reported he was diagnosed with her posttraumatic stress disorder by [facility] and has multiple acute psychiatric hospitalizations and also residential substance abuse program in [place]. Patient reportedly noncompliant with his medication and has been abusing several drugs including alcohol, crack cocaine, opiates and benzodiazepines. Patient reportedly staying with his sister and a grandmother. Patient reportedly has at least 3 D.W. I. and lost his driver's license and unable to get ride to the [place] programs. Reportedly he has been calling substance abuse program in [place] with out obtaining bed for treatment. Patient stated he gave up after calling 5 weeks. Patient reported he was diagnosed with the traumatic brain injury along with her posttraumatic stress disorder. Patient endorses getting scared with the his presentation after been drinking and using cocaine.  Patient Has No current symptoms of alcohol withdrawal or cocaine craving.  Patient Never  had DT's although he also has not been without alcohol for too long. patient made a statement he hate being sober. Patient stated that he is ready to make change in his treatment. Patient denied suicidal or homicidal ideation intentions or plans. Patient has no evidence of psychotic symptoms. Patient father has been in [employer].   Mental Status Examination: Patient is calm and cooperative. He has been poorly groomed,  disheveled and unshaven beard. He has good eye contact. Patient has  anxious  mood and his affect was  appropriate. He has normal rate, rhythm, and volume of speech. His thought process is linear and goal directed. Patient has denied suicidal, homicidal ideations, intentions or plans. Patient  has no evidence of auditory or visual hallucinations, delusions, and paranoia. Patient has fair insight judgment and impulse control.  Past Medical History  Diagnosis Date  . Bipolar disorder   . PTSD (post-traumatic stress disorder)   . Depression     Past Surgical History  Procedure Laterality Date  . Appendectomy      Family History  Problem Relation Age of Onset  . Hypertension Mother   . Alcoholism Other     Social History:  reports that he has been smoking Cigarettes.  He has a 15 pack-year smoking history. He has quit using smokeless tobacco. His smokeless tobacco use included Snuff. He reports that he drinks about 97.2 ounces of alcohol per week. He reports that he uses illicit drugs (Cocaine, Other-see comments, and Benzodiazepines) about 3 times per week.  Allergies: No Known Allergies  Medications: I have reviewed the patient's current medications.  Results for orders placed during the hospital encounter of 09/19/13 (from the past 48 hour(s))  TROPONIN I     Status: None   Collection Time    09/19/13  7:55 PM      Result Value Range   Troponin I <0.30  <0.30 ng/mL   Comment:            Due to the release kinetics of cTnI,     a negative result within the first hours     of the onset of symptoms does not rule out     myocardial infarction with certainty.     If myocardial infarction is still suspected,     repeat the test at appropriate intervals.  CBC     Status: None  Collection Time    09/20/13  7:45 AM      Result Value Range   WBC 6.7  4.0 - 10.5 K/uL   RBC 4.27  4.22 - 5.81 MIL/uL   Hemoglobin 13.7  13.0 - 17.0 g/dL   HCT 16.1  09.6 - 04.5 %   MCV 97.0  78.0 - 100.0 fL   MCH 32.1  26.0 - 34.0 pg   MCHC 33.1  30.0 - 36.0 g/dL   RDW 40.9  81.1 - 91.4 %   Platelets 285  150 - 400 K/uL    Mri Brain Without Contrast  09/20/2013   CLINICAL DATA:  Numbness left face and arm. Numbness right leg. Rule out stroke.  EXAM: MRI HEAD WITHOUT CONTRAST  MRA  HEAD WITHOUT CONTRAST  TECHNIQUE: Multiplanar, multiecho pulse sequences of the brain and surrounding structures were obtained without intravenous contrast. Angiographic images of the head were obtained using MRA technique without contrast.  COMPARISON:  CT head 09/19/2013.  FINDINGS: MRI HEAD FINDINGS  Ventricle size is normal. Craniocervical junction is normal. Pituitary is normal in size.  Negative for acute or chronic infarct. Negative for demyelinating disease. Cerebral white matter is normal. Basal ganglia and brainstem are normal.  Negative for intracranial hemorrhage or fluid collection. Negative for mass or edema.  Paranasal sinuses are clear.  MRA HEAD FINDINGS  Both vertebral arteries are patent to the basilar. Posterior inferior cerebellar artery is patent on the left but not visualized on the right. There is a dominant right AICA. Basilar is widely patent. Superior cerebellar and posterior cerebral arteries are patent bilaterally.  Internal carotid artery is widely patent bilaterally. Anterior and middle cerebral arteries are patent bilaterally.  Negative for cerebral aneurysm.  IMPRESSION: MRI of the brain is normal.  Normal MRA of the head.   Electronically Signed   By: Marlan Palau M.D.   On: 09/20/2013 13:33   Mr Maxine Glenn Head/brain Wo Cm  09/20/2013   CLINICAL DATA:  Numbness left face and arm. Numbness right leg. Rule out stroke.  EXAM: MRI HEAD WITHOUT CONTRAST  MRA HEAD WITHOUT CONTRAST  TECHNIQUE: Multiplanar, multiecho pulse sequences of the brain and surrounding structures were obtained without intravenous contrast. Angiographic images of the head were obtained using MRA technique without contrast.  COMPARISON:  CT head 09/19/2013.  FINDINGS: MRI HEAD FINDINGS  Ventricle size is normal. Craniocervical junction is normal. Pituitary is normal in size.  Negative for acute or chronic infarct. Negative for demyelinating disease. Cerebral white matter is normal. Basal ganglia and brainstem are  normal.  Negative for intracranial hemorrhage or fluid collection. Negative for mass or edema.  Paranasal sinuses are clear.  MRA HEAD FINDINGS  Both vertebral arteries are patent to the basilar. Posterior inferior cerebellar artery is patent on the left but not visualized on the right. There is a dominant right AICA. Basilar is widely patent. Superior cerebellar and posterior cerebral arteries are patent bilaterally.  Internal carotid artery is widely patent bilaterally. Anterior and middle cerebral arteries are patent bilaterally.  Negative for cerebral aneurysm.  IMPRESSION: MRI of the brain is normal.  Normal MRA of the head.   Electronically Signed   By: Marlan Palau M.D.   On: 09/20/2013 13:33    Positive for anxiety, bad mood, behavior problems, bipolar, excessive alcohol consumption, illegal drug usage, mood swings and sleep disturbance Blood pressure 119/77, pulse 84, temperature 97.7 F (36.5 C), temperature source Oral, resp. rate 18, height 5\' 11"  (1.803 m), weight  73.3 kg (161 lb 9.6 oz), SpO2 100.00%.   Assessment/Plan: Posttraumatic stress disorder by history Polysubstance dependence Alcohol intoxication Cocaine intoxication  Recommendation: Patient does not meet criteria for acute psychiatric hospitalization Patient will be referred to the residential substance abuse treatment program Social service provided information regarding local programs Patient mother is willing to place him in a long-term residential treatment place out of the state after 20 a day program  Continue current treatment in the detox program Effexor XL 75 mg daily with breakfast for depression and anxiety Appreciate psychiatric consultation and will sign off at this time  [place]rib[place] Lehnen,JANARDHAHA R. 09/21/2013, 5:06 PM

## 2013-09-22 MED ORDER — VENLAFAXINE HCL ER 75 MG PO CP24: 75.0000 mg | ORAL_CAPSULE | Freq: Every day | ORAL | Status: DC

## 2013-09-22 NOTE — Progress Notes (Signed)
Pt. DC'd home via car with mom.  DC instructions and prescriptions given.  Assessments were stable.

## 2013-09-22 NOTE — Clinical Social Work Note (Signed)
CSW contacted [facility] regarding inpatient drug treatment for patient and was advised that he is not appropriate for their services. CSW contacted [facility] and spoke with [place] regarding inpatient treatment for patient. She requested to speak with patient and gave phone number for patient to call her [phone]). CSW visited with patient and talked with him about inpatient treatment. Patient advised that he is not appropriate for [facility] and that clinican from [facility] wanted to speak with him. Mr. Massenburg indicated that he would rather go to [facility] in [place] and he had been provided with the contact information by CSW Leron Croak on 09/21/13. Patient advised that he will have to make contact with them for inpatient treatment and he is ready for discharge today from the hospital. Patient thanked CSW for her assistance.  Genelle Bal, MSW, LCSW [phone]

## 2013-09-22 NOTE — Progress Notes (Signed)
[facility]                                                                                Patient Demographics  Curtis Blankenship, is a 32 y.o. male, DOB - 1981/08/04, WUJ:[id number]  Admit date - 09/19/2013   Admitting Physician Therisa Doyne, MD  Outpatient Primary MD for the patient is Provider Not In System  LOS - 3   Chief Complaint  Patient presents with  . Numbness    facial  . Visual Field Change    left side        Assessment & Plan    Active Problems:   Alcohol dependence   Cocaine abuse   TIA (transient ischemic attack)   Disturbance of skin sensation  Numbness and blurry vision, rule out TIA -CT of the head was negative. -Echocardiogram showed EF 55-65% with no PFO noted. -Carotid Doppler: 1-39% ICA stenosis. Vertebral artery flow is antegrade.  -Neurology consult -MRI/MRA negative for acute process -Numbness improved vision resolved  Polysubstance abuse -Currently on withdrawal protocol -Patient has been counseled -Will speak to case management regarding possible rehabilitation options -Psychiatry consulted and started patient on Effexor  Possible Delirium Tremens -Possible withdrawal of benzo, cocaine, and opioids -Continue CIWA  Code Status: Full  Family Communication: None  Disposition Plan: Patient's be discharge today please see full discharge summary conducted yesterday. No changes overnight. Again patient was started on Effexor for by psychiatry. At this time he is pending placement. Patient was not a candidate for inpatient psychiatry.   Procedures Echocardiogram Study Conclusions - Left ventricle: The cavity size was normal. Wall thickness was normal. Systolic function was normal. The estimated ejection fraction was in the range of 55% to 65%. Wall motion was normal; there were no regional wall motion abnormalities. Left ventricular diastolic function  parameters were normal. No evidence of thrombus. - Atrial septum: No  defect or patent foramen ovale was identified. - Pulmonic valve: Mild regurgitation.  Carotid doppler Preliminary report: Bilateral: 1-39% ICA stenosis. Vertebral artery flow is antegrade.   Consults   Neurology  DVT Prophylaxis  SCDs   Lab Results  Component Value Date   PLT 285 09/20/2013    Medications  Scheduled Meds: . aspirin  325 mg Oral Daily  . folic acid  1 mg Oral Daily  . influenza vac split quadrivalent PF  0.5 mL Intramuscular Tomorrow-1000  . LORazepam  0-4 mg Intravenous Q12H  . multivitamin with minerals  1 tablet Oral Daily  . nicotine  14 mg Transdermal Daily  . thiamine  100 mg Oral Daily  . venlafaxine XR  75 mg Oral Q breakfast   Continuous Infusions:  PRN Meds:.acetaminophen, dicyclomine, hydrOXYzine, loperamide, methocarbamol, naproxen, ondansetron  Antibiotics   Anti-infectives   None       Time Spent in minutes   20 minutes   Macoy Rodwell D.O. on 09/22/2013 at 8:17 AM  Between 7am to 7pm - Pager - [phone]  After 7pm go to [email or web address] - password [email or web address]  And look for the night coverage person covering for me after hours  [facility] Office  [phone]    Subjective:   Curtis Blankenship seen and  examined today. Patient no longer complains of numbness or blurry vision.  Patient denies dizziness, chest pain, shortness of breath, abdominal pain, N/V/D/C, new weakness, numbess, tingling.    Objective:   Filed Vitals:   09/21/13 2140 09/21/13 2329 09/22/13 0120 09/22/13 0400  BP: 122/80 128/78 136/89 120/66  Pulse: 93 96 97 58  Temp: 98.6 F (37 C)  98.3 F (36.8 C) 97.9 F (36.6 C)  TempSrc: Oral  Oral Oral  Resp: 16  16 16   Height:      Weight:      SpO2: 99%  94% 99%    Wt Readings from Last 3 Encounters:  09/19/13 73.3 kg (161 lb 9.6 oz)  03/05/13 76.204 kg (168 lb)  12/04/12 79.379 kg (175 lb)     Intake/Output Summary (Last 24 hours) at 09/22/13 0817 Last data filed at 09/21/13 1700  Gross  per 24 hour  Intake    760 ml  Output      0 ml  Net    760 ml    Exam  General: Well developed, well nourished, NAD, appears stated age  HEENT: NCAT, PERRLA, EOMI, Anicteic Sclera, mucous membranes moist. No pharyngeal erythema or exudates  Neck: Supple, no JVD, no masses  Cardiovascular: S1 S2 auscultated, no rubs, murmurs or gallops. Regular rate and rhythm.  Respiratory: Clear to auscultation bilaterally with equal chest rise  Abdomen: Soft, nontender, nondistended, + bowel sounds  Extremities: warm dry without cyanosis clubbing or edema  Neuro: AAOx3, cranial nerves grossly intact. Strength 5/5 in patient's upper and lower extremities bilaterally  Skin: Without rashes exudates or nodules  Psych: Normal affect and demeanor with intact judgement and insight  Data Review   Micro Results No results found for this or any previous visit (from the past 240 hour(s)).  Radiology Reports Ct Head Wo Contrast  09/19/2013   CLINICAL DATA:  Left facial numbness with visual changes  EXAM: CT HEAD WITHOUT CONTRAST  TECHNIQUE: Contiguous axial images were obtained from the base of the skull through the vertex without intravenous contrast.  COMPARISON:  None.  FINDINGS: There is no acute intracranial hemorrhage or large vessel territory infarct. Gray-white matter differentiation is maintained. No mass or midline shift. No hydrocephalus. No extra-axial fluid collection. Calvarium is intact.  Paranasal sinuses and mastoid air cells are clear.  IMPRESSION: Normal head CT with no acute intracranial process identified.   Electronically Signed   By: Rise Mu M.D.   On: 09/19/2013 02:20    CBC  Recent Labs Lab 09/19/13 0215 09/20/13 0745  WBC 14.7* 6.7  HGB 15.0 13.7  HCT 43.2 41.4  PLT 379 285  MCV 91.5 97.0  MCH 31.8 32.1  MCHC 34.7 33.1  RDW 13.0 13.4  LYMPHSABS 2.0  --   MONOABS 0.8  --   EOSABS 0.0  --   BASOSABS 0.1  --     Chemistries   Recent Labs Lab  09/19/13 0215  NA 131*  K 4.0  CL 93*  CO2 23  GLUCOSE 89  BUN 11  CREATININE 1.01  CALCIUM 9.9  AST 26  ALT 18  ALKPHOS 92  BILITOT 0.7   ------------------------------------------------------------------------------------------------------------------ estimated creatinine clearance is 110.9 ml/min (by C-G formula based on Cr of 1.01). ------------------------------------------------------------------------------------------------------------------  Recent Labs  09/19/13 0940  HGBA1C 5.0   ------------------------------------------------------------------------------------------------------------------  Recent Labs  09/19/13 0940  CHOL 181  HDL 51  LDLCALC 99  TRIG 153*  CHOLHDL 3.5   ------------------------------------------------------------------------------------------------------------------ No results found  for this basename: TSH, T4TOTAL, FREET3, T3FREE, THYROIDAB,  in the last 72 hours ------------------------------------------------------------------------------------------------------------------ No results found for this basename: VITAMINB12, FOLATE, FERRITIN, TIBC, IRON, RETICCTPCT,  in the last 72 hours  Coagulation profile No results found for this basename: INR, PROTIME,  in the last 168 hours  No results found for this basename: DDIMER,  in the last 72 hours  Cardiac Enzymes  Recent Labs Lab 09/19/13 0215 09/19/13 1308 09/19/13 1955  TROPONINI <0.30 <0.30 <0.30   ------------------------------------------------------------------------------------------------------------------ No components found with this basename: POCBNP,

## 2013-09-23 NOTE — Progress Notes (Signed)
UR complete.  Kimiyah Blick RN, MSN 

## 2013-09-28 ENCOUNTER — Emergency Department (HOSPITAL_COMMUNITY)
Admission: EM | Admit: 2013-09-28 | Discharge: 2013-09-29 | Disposition: A | Discharge status: Home / Self Care | Payer: Non-veteran care | Attending: Emergency Medicine | Admitting: Emergency Medicine

## 2013-09-28 ENCOUNTER — Encounter (HOSPITAL_COMMUNITY): Payer: Self-pay | Admitting: Emergency Medicine

## 2013-09-28 DIAGNOSIS — F1310 Sedative, hypnotic or anxiolytic abuse, uncomplicated: Secondary | ICD-10-CM | POA: Insufficient documentation

## 2013-09-28 DIAGNOSIS — F1020 Alcohol dependence, uncomplicated: Secondary | ICD-10-CM

## 2013-09-28 DIAGNOSIS — IMO0002 Reserved for concepts with insufficient information to code with codable children: Secondary | ICD-10-CM | POA: Insufficient documentation

## 2013-09-28 DIAGNOSIS — R45851 Suicidal ideations: Secondary | ICD-10-CM | POA: Insufficient documentation

## 2013-09-28 DIAGNOSIS — F1410 Cocaine abuse, uncomplicated: Secondary | ICD-10-CM

## 2013-09-28 DIAGNOSIS — Z046 Encounter for general psychiatric examination, requested by authority: Secondary | ICD-10-CM | POA: Diagnosis present

## 2013-09-28 DIAGNOSIS — F329 Major depressive disorder, single episode, unspecified: Secondary | ICD-10-CM

## 2013-09-28 DIAGNOSIS — F17200 Nicotine dependence, unspecified, uncomplicated: Secondary | ICD-10-CM | POA: Diagnosis not present

## 2013-09-28 DIAGNOSIS — F4310 Post-traumatic stress disorder, unspecified: Secondary | ICD-10-CM | POA: Diagnosis not present

## 2013-09-28 DIAGNOSIS — F319 Bipolar disorder, unspecified: Secondary | ICD-10-CM | POA: Insufficient documentation

## 2013-09-28 DIAGNOSIS — Z8673 Personal history of transient ischemic attack (TIA), and cerebral infarction without residual deficits: Secondary | ICD-10-CM | POA: Diagnosis not present

## 2013-09-28 DIAGNOSIS — F1110 Opioid abuse, uncomplicated: Secondary | ICD-10-CM | POA: Diagnosis not present

## 2013-09-28 DIAGNOSIS — F32A Depression, unspecified: Secondary | ICD-10-CM

## 2013-09-28 DIAGNOSIS — F1910 Other psychoactive substance abuse, uncomplicated: Secondary | ICD-10-CM

## 2013-09-28 LAB — CBC WITH DIFFERENTIAL/PLATELET
Basophils Absolute: 0.1 10*3/uL (ref 0.0–0.1)
Basophils Relative: 1 % (ref 0–1)
Eosinophils Relative: 2 % (ref 0–5)
HCT: 43.9 % (ref 39.0–52.0)
MCHC: 35.5 g/dL (ref 30.0–36.0)
MCV: 91.8 fL (ref 78.0–100.0)
Monocytes Absolute: 0.8 10*3/uL (ref 0.1–1.0)
Monocytes Relative: 10 % (ref 3–12)
Neutro Abs: 4.5 10*3/uL (ref 1.7–7.7)
Platelets: 405 10*3/uL — ABNORMAL HIGH (ref 150–400)
RDW: 13.1 % (ref 11.5–15.5)

## 2013-09-28 LAB — COMPREHENSIVE METABOLIC PANEL
AST: 21 U/L (ref 0–37)
Albumin: 4.6 g/dL (ref 3.5–5.2)
BUN: 15 mg/dL (ref 6–23)
CO2: 23 mEq/L (ref 19–32)
Calcium: 10 mg/dL (ref 8.4–10.5)
Chloride: 95 mEq/L — ABNORMAL LOW (ref 96–112)
Creatinine, Ser: 1.08 mg/dL (ref 0.50–1.35)
GFR calc non Af Amer: >90 mL/min (ref >90)
Sodium: 134 mEq/L — ABNORMAL LOW (ref 135–145)

## 2013-09-28 MED ORDER — LORAZEPAM 1 MG PO TABS: 0.0000 mg | ORAL_TABLET | Freq: Two times a day (BID) | ORAL | Status: DC

## 2013-09-28 MED ORDER — THIAMINE HCL 100 MG/ML IJ SOLN: 100.0000 mg | Freq: Every day | INTRAMUSCULAR | Status: DC

## 2013-09-28 MED ORDER — VITAMIN B-1 100 MG PO TABS
100.0000 mg | ORAL_TABLET | Freq: Every day | ORAL | Status: DC
  Administered 2013-09-29: 10:00:00 100 mg via ORAL
  Filled 2013-09-28: qty 1

## 2013-09-28 MED ORDER — LORAZEPAM 1 MG PO TABS
0.0000 mg | ORAL_TABLET | Freq: Four times a day (QID) | ORAL | Status: DC
  Administered 2013-09-29: 1 mg via ORAL
  Administered 2013-09-29: 2 mg via ORAL
  Filled 2013-09-28: qty 2
  Filled 2013-09-28: qty 1

## 2013-09-28 NOTE — ED Notes (Signed)
Patient has been IVC'd.  IVC papers state that the patient is suicidal and papers were taken out by the mother. Papers also indicate that the patient is aggressive. [facility] department report that the patent has been cooperative and calm. Patient denies SI/HI. Patient currently has handcuffs on. Patient states he last drank alcohol 12 hours ago. Opiates and Benzos 8 hours ago. Cocaine approx 8 hours ago.

## 2013-09-28 NOTE — [facility] Counselor (Signed)
Writer attempted to assess the pt, pt is unable to stay awake to participate in the assessment. Writer contacted Minerva Areola- to discuss case. Denice Bors, [facility] 09/28/2013 7:04 PM

## 2013-09-29 ENCOUNTER — Encounter (HOSPITAL_COMMUNITY): Payer: Self-pay | Admitting: Registered Nurse

## 2013-09-29 DIAGNOSIS — F329 Major depressive disorder, single episode, unspecified: Secondary | ICD-10-CM

## 2013-09-29 DIAGNOSIS — F1020 Alcohol dependence, uncomplicated: Secondary | ICD-10-CM

## 2013-09-29 DIAGNOSIS — F1410 Cocaine abuse, uncomplicated: Secondary | ICD-10-CM

## 2013-09-29 DIAGNOSIS — F4310 Post-traumatic stress disorder, unspecified: Secondary | ICD-10-CM

## 2013-09-29 LAB — RAPID URINE DRUG SCREEN, HOSP PERFORMED
Barbiturates: NOT DETECTED
Benzodiazepines: POSITIVE — AB
Cocaine: POSITIVE — AB
Tetrahydrocannabinol: NOT DETECTED

## 2013-09-29 MED ORDER — TRAZODONE HCL 50 MG PO TABS: 50.0000 mg | ORAL_TABLET | Freq: Every evening | ORAL | Status: DC | PRN

## 2013-09-29 NOTE — ED Provider Notes (Signed)
Patient is released was IVC. Awaiting hopefully go to the [place] for voluntary treatment for polysubstance abuse. He change his mind.  To discharge. Is not on IVC. He will be discharged with outpatient resources  Roney Marion, MD 09/29/13 2158

## 2013-09-29 NOTE — ED Provider Notes (Signed)
CSN: [id number]     Arrival date & time 09/28/13  1424 History   First MD Initiated Contact with Patient 09/28/13 2258     Chief Complaint  Patient presents with  . Medical Clearance  . Suicidal   (Consider location/radiation/quality/duration/timing/severity/associated sxs/prior Treatment) HPI  This is a 32 year old male with history of polysubstance abuse and post traumatic stress disorder followed primarily at the [place] who presents with IVC paperwork.  Patient presented earlier today by police escort.  Patient was just seen and discharged from the hospital for cocaine abuse and TIA workup.  Per IVC paperwork, patient has hallucinations and is suicidal. Patient denies this to me. He denies homicidal or suicidal ideation. He is cooperative with exam. He does endorse alcohol usage within the last 24 hours as well as opiates, benzos, and cocaine. He denies any physical symptoms.  I spoke with the patient's mother who took out the IVC paperwork on the patient. The mother describes that since discharge, the patient has continued to abuse alcohol and drugs. When he is under the importance. He gets easily agitated. He got very mad at his mom over a money-related issue. At one point, the mother did state that while he was drunk and he got mad at her and said "why don't you just shoot me."  Mother states he is otherwise not had any suicidal or homicidal ideation. She states that she does feel that he is at risk because his addiction but some in contact with dangerous drug users. She states they have come out of her house.  Past Medical History  Diagnosis Date  . Bipolar disorder   . PTSD (post-traumatic stress disorder)   . Depression    Past Surgical History  Procedure Laterality Date  . Appendectomy     Family History  Problem Relation Age of Onset  . Hypertension Mother   . Alcoholism Other    History  Substance Use Topics  . Smoking status: Current Every Day Smoker -- 1.50 packs/day for 10  years    Types: Cigarettes  . Smokeless tobacco: Former Neurosurgeon    Types: Snuff  . Alcohol Use: 97.2 oz/week    150 Cans of beer, 12 Shots of liquor per week     Comment: he drinks every day as much as he can    Review of Systems  Constitutional: Negative.   Respiratory: Negative.  Negative for chest tightness and shortness of breath.   Cardiovascular: Negative.  Negative for chest pain.  Gastrointestinal: Negative.  Negative for abdominal pain.  Genitourinary: Negative.  Negative for dysuria.  Musculoskeletal: Negative for back pain.  Skin: Negative for rash.  Neurological: Negative for headaches.  Psychiatric/Behavioral: Negative for suicidal ideas, self-injury and agitation.  All other systems reviewed and are negative.    Allergies  Review of patient's allergies indicates no known allergies.  Home Medications   Current Outpatient Rx  Name  Route  Sig  Dispense  Refill  . aspirin 325 MG tablet   Oral   Take 650 mg by mouth 2 (two) times daily as needed for mild pain or headache.         . dicyclomine (BENTYL) 20 MG tablet   Oral   Take 1 tablet (20 mg total) by mouth every 6 (six) hours as needed for spasms (abdominal cramping).   30 tablet   0   . diphenhydrAMINE (BENADRYL) 25 MG tablet   Oral   Take 25 mg by mouth every 8 (eight) hours as  needed for allergies.         . folic acid (FOLVITE) 1 MG tablet   Oral   Take 1 tablet (1 mg total) by mouth daily.   30 tablet      . hydrOXYzine (ATARAX/VISTARIL) 25 MG tablet   Oral   Take 1 tablet (25 mg total) by mouth every 6 (six) hours as needed for anxiety.   30 tablet   0   . methocarbamol (ROBAXIN) 500 MG tablet   Oral   Take 1 tablet (500 mg total) by mouth every 8 (eight) hours as needed for muscle spasms.   30 tablet   0   . Multiple Vitamin (MULTIVITAMIN WITH MINERALS) TABS tablet   Oral   Take 1 tablet by mouth daily.         . nicotine (NICODERM CQ - DOSED IN MG/24 HOURS) 14 mg/24hr patch    Transdermal   Place 1 patch (14 mg total) onto the skin daily.   28 patch   0   . thiamine 100 MG tablet   Oral   Take 1 tablet (100 mg total) by mouth daily.         Marland Kitchen venlafaxine XR (EFFEXOR-XR) 75 MG 24 hr capsule   Oral   Take 1 capsule (75 mg total) by mouth daily with breakfast.   30 capsule   0    BP 104/66  Pulse 79  Temp(Src) 97.6 F (36.4 C) (Oral)  Resp 18  Ht 5\' 11"  (1.803 m)  SpO2 97% Physical Exam  Nursing note and vitals reviewed. Constitutional: He is oriented to person, place, and time.  Disheveled, no acute distress  HENT:  Head: Normocephalic and atraumatic.  Mouth/Throat: No oropharyngeal exudate.  Eyes: Pupils are equal, round, and reactive to light.  Neck: Neck supple.  Cardiovascular: Normal rate and normal heart sounds.   Pulmonary/Chest: Effort normal. No respiratory distress.  Abdominal: Soft. There is no tenderness.  Musculoskeletal: He exhibits no edema.  Lymphadenopathy:    He has no cervical adenopathy.  Neurological: He is alert and oriented to person, place, and time.  Skin: Skin is warm and dry.  Psychiatric:  Sleepy but arousable, alert and oriented, calm and cooperative, clear thought content    ED Course  Procedures (including critical care time) Labs Review Labs Reviewed  CBC WITH DIFFERENTIAL - Abnormal; Notable for the following:    Platelets 405 (*)    All other components within normal limits  COMPREHENSIVE METABOLIC PANEL - Abnormal; Notable for the following:    Sodium 134 (*)    Chloride 95 (*)    Glucose, Bld 137 (*)    All other components within normal limits  ETHANOL  URINE RAPID DRUG SCREEN (HOSP PERFORMED)   Imaging Review No results found.  EKG Interpretation   None       MDM  No diagnosis found.  This is a 32 year old male who presents for involuntary commitment. Patient has a history of polysubstance abuse as well as bipolar and PTSD. Patient denies any HI or SI on my initial evaluation. He  is calm, cooperative and does not appear psychotic.  I called the patient's mother who clearly describes events leading up to the IVC paperwork. While the patient clearly has an issue with drugs and alcohol and those behaviors put him at risk, the mother confirms that he has not been homicidal or suicidal. She also states that he is only agitated and only gets mad when he is  under the influence. Given this collateral information, I have rescinded IVC paperwork. I have requested that the patient remain here under voluntary terms to be evaluated. Patient states he is willing to stay.  Psych holding orders placed.  CIWA.    Shon Baton, MD 09/29/13 [phone]

## 2013-09-29 NOTE — ED Notes (Signed)
Patient requests Ativan at HS.  Has been in bed all shift.  Support offered.

## 2013-09-29 NOTE — Consult Note (Signed)
[facility] Face-to-Face Psychiatry Consult   Reason for Consult:  Drug and alcohol abuse Referring Physician:  EDP  Curtis Blankenship is an 32 y.o. male.  Assessment: AXIS I:  Alcohol Abuse, Depressive Disorder NOS and Substance Abuse AXIS II:  Deferred AXIS III:   Past Medical History  Diagnosis Date  . Bipolar disorder   . PTSD (post-traumatic stress disorder)   . Depression    AXIS IV:  occupational problems, other psychosocial or environmental problems and problems related to social environment AXIS V:  51-60 moderate symptoms  Plan:  No evidence of imminent risk to self or others at present.   Patient does not meet criteria for psychiatric inpatient admission.  Subjective:   Curtis Blankenship is a 32 y.o. male patient.  HPI:  Patient states "I don't know why I am here.  My mother took papers out on me.  I didn't do anything.  I won't even at home."  Patient also states "I am not in the business of killing other people"  When asked if he was having suicidal thoughts patient responded "If you keep asking questions that might be negotiable."  Patient states that he was on "a plethora of medicines" but he has not taken them in about 8 months.  Patient states that he is not receiving any outpatient services at this time because he doesn't have transportation or money to buy his medication.  "I don't have the means to get to a doctor or buy my medicines."  Patient agitated and states that he is not answering anymore questions "you can read my chart."  During consult/interview patient had covers pulled over his head and wouldn't pull down; no eye contact; irritable at questions and angry because he was here in hospital.    Past Psychiatric History: Past Medical History  Diagnosis Date  . Bipolar disorder   . PTSD (post-traumatic stress disorder)   . Depression     reports that he has been smoking Cigarettes.  He has a 15 pack-year smoking history. He has quit using smokeless tobacco. His  smokeless tobacco use included Snuff. He reports that he drinks about 97.2 ounces of alcohol per week. He reports that he uses illicit drugs (Cocaine, Other-see comments, and Benzodiazepines) about 3 times per week. Family History  Problem Relation Age of Onset  . Hypertension Mother   . Alcoholism Other    Family History Substance Abuse: No Family Supports: Yes, List: (Mother ) Living Arrangements: Other (Comment) (Homeless ) Can pt return to current living arrangement?: Yes Abuse/Neglect [facility]) Physical Abuse: Denies Verbal Abuse: Denies Sexual Abuse: Denies Allergies:  No Known Allergies  ACT Assessment Complete:  Yes:    Educational Status    Risk to Self: Risk to self Suicidal Ideation: Yes-Currently Present Suicidal Intent: Yes-Currently Present Is patient at risk for suicide?: Yes Suicidal Plan?: No-Not Currently/Within Last 6 Months Access to Means: No What has been your use of drugs/alcohol within the last 12 months?: Pt Abusing: Alcohol, Opiates  Previous Attempts/Gestures: Yes How many times?: 1 Other Self Harm Risks: None  Triggers for Past Attempts: Unpredictable Intentional Self Injurious Behavior: None Family Suicide History: No 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) Persecutory voices/beliefs?: No Depression: Yes Depression Symptoms: Loss of interest in usual pleasures;Feeling worthless/self pity Substance abuse history and/or treatment for substance abuse?: Yes Suicide prevention information given to non-admitted patients: Not applicable  Risk to Others: Risk to Others Homicidal Ideation: No Thoughts of Harm to Others: No Current Homicidal  Intent: No Current Homicidal Plan: No Access to Homicidal Means: No Identified Victim: None  History of harm to others?: No Assessment of Violence: None Noted Violent Behavior Description: None  Does patient have access to weapons?: No Criminal Charges Pending?: No Does patient have a  court date: No  Abuse: Abuse/Neglect Assessment (Assessment to be complete while patient is alone) Physical Abuse: Denies Verbal Abuse: Denies Sexual Abuse: Denies Exploitation of patient/patient's resources: Denies Self-Neglect: Denies  Prior Inpatient Therapy: Prior Inpatient Therapy Prior Inpatient Therapy: Yes Prior Therapy Dates: 2012 [facility] Reason for Treatment: Detox/Rehab/SI/Depression   Prior Outpatient Therapy: Prior Outpatient Therapy Prior Outpatient Therapy: Yes Prior Therapy Dates: 2014 Prior Therapy Facilty/Provider(s): [facility] [place]  Reason for Treatment: Med Mgt   Additional Information: Additional Information 1:1 In Past 12 Months?: No CIRT Risk: No Elopement Risk: No Does patient have medical clearance?: Yes                  Objective: Blood pressure 120/75, pulse 78, temperature 97.4 F (36.3 C), temperature source Oral, resp. rate 18, height 5\' 11"  (1.803 m), SpO2 98.00%.There is no weight on file to calculate BMI. Results for orders placed during the hospital encounter of 09/28/13 (from the past 72 hour(s))  CBC WITH DIFFERENTIAL     Status: Abnormal   Collection Time    09/28/13  2:45 PM      Result Value Range   WBC 8.1  4.0 - 10.5 K/uL   RBC 4.78  4.22 - 5.81 MIL/uL   Hemoglobin 15.6  13.0 - 17.0 g/dL   HCT 16.1  09.6 - 04.5 %   MCV 91.8  78.0 - 100.0 fL   MCH 32.6  26.0 - 34.0 pg   MCHC 35.5  30.0 - 36.0 g/dL   RDW 40.9  81.1 - 91.4 %   Platelets 405 (*) 150 - 400 K/uL   Neutrophils Relative % 56  43 - 77 %   Neutro Abs 4.5  1.7 - 7.7 K/uL   Lymphocytes Relative 33  12 - 46 %   Lymphs Abs 2.7  0.7 - 4.0 K/uL   Monocytes Relative 10  3 - 12 %   Monocytes Absolute 0.8  0.1 - 1.0 K/uL   Eosinophils Relative 2  0 - 5 %   Eosinophils Absolute 0.1  0.0 - 0.7 K/uL   Basophils Relative 1  0 - 1 %   Basophils Absolute 0.1  0.0 - 0.1 K/uL  COMPREHENSIVE METABOLIC PANEL     Status:  Abnormal   Collection Time    09/28/13  2:45 PM      Result Value Range   Sodium 134 (*) 135 - 145 mEq/L   Potassium 3.7  3.5 - 5.1 mEq/L   Chloride 95 (*) 96 - 112 mEq/L   CO2 23  19 - 32 mEq/L   Glucose, Bld 137 (*) 70 - 99 mg/dL   BUN 15  6 - 23 mg/dL   Creatinine, Ser 7.82  0.50 - 1.35 mg/dL   Calcium 95.6  8.4 - 21.3 mg/dL   Total Protein 8.1  6.0 - 8.3 g/dL   Albumin 4.6  3.5 - 5.2 g/dL   AST 21  0 - 37 U/L   ALT 21  0 - 53 U/L   Alkaline Phosphatase 105  39 - 117 U/L   Total Bilirubin 0.8  0.3 - 1.2 mg/dL   GFR calc non Af Amer >90  >  90 mL/min   GFR calc Af Amer >90  >90 mL/min   Comment: (NOTE)     The eGFR has been calculated using the CKD EPI equation.     This calculation has not been validated in all clinical situations.     eGFR's persistently <90 mL/min signify possible Chronic Kidney     Disease.  ETHANOL     Status: None   Collection Time    09/28/13  2:45 PM      Result Value Range   Alcohol, Ethyl (B) <11  0 - 11 mg/dL   Comment:            LOWEST DETECTABLE LIMIT FOR     SERUM ALCOHOL IS 11 mg/dL     FOR MEDICAL PURPOSES ONLY  URINE RAPID DRUG SCREEN (HOSP PERFORMED)     Status: Abnormal   Collection Time    09/29/13  2:55 AM      Result Value Range   Opiates NONE DETECTED  NONE DETECTED   Cocaine POSITIVE (*) NONE DETECTED   Benzodiazepines POSITIVE (*) NONE DETECTED   Amphetamines NONE DETECTED  NONE DETECTED   Tetrahydrocannabinol NONE DETECTED  NONE DETECTED   Barbiturates NONE DETECTED  NONE DETECTED   Comment:            DRUG SCREEN FOR MEDICAL PURPOSES     ONLY.  IF CONFIRMATION IS NEEDED     FOR ANY PURPOSE, NOTIFY LAB     WITHIN 5 DAYS.                LOWEST DETECTABLE LIMITS     FOR URINE DRUG SCREEN     Drug Class       Cutoff (ng/mL)     Amphetamine      1000     Barbiturate      200     Benzodiazepine   200     Tricyclics       300     Opiates          300     Cocaine          300     THC              50     Current  Facility-Administered Medications  Medication Dose Route Frequency Provider Last Rate Last Dose  . LORazepam (ATIVAN) tablet 0-4 mg  0-4 mg Oral Q6H Shon Baton, MD   2 mg at 09/29/13 0257   Followed by  . [START ON 10/01/2013] LORazepam (ATIVAN) tablet 0-4 mg  0-4 mg Oral Q12H Mayer Masker Horton, MD      . thiamine (VITAMIN B-1) tablet 100 mg  100 mg Oral Daily Shon Baton, MD   100 mg at 09/29/13 1014   Or  . thiamine (B-1) injection 100 mg  100 mg Intravenous Daily Shon Baton, MD       Current Outpatient Prescriptions  Medication Sig Dispense Refill  . aspirin 325 MG tablet Take 650 mg by mouth 2 (two) times daily as needed for mild pain or headache.      . dicyclomine (BENTYL) 20 MG tablet Take 1 tablet (20 mg total) by mouth every 6 (six) hours as needed for spasms (abdominal cramping).  30 tablet  0  . diphenhydrAMINE (BENADRYL) 25 MG tablet Take 25 mg by mouth every 8 (eight) hours as needed for allergies.      . folic acid (FOLVITE) 1 MG  tablet Take 1 tablet (1 mg total) by mouth daily.  30 tablet    . hydrOXYzine (ATARAX/VISTARIL) 25 MG tablet Take 1 tablet (25 mg total) by mouth every 6 (six) hours as needed for anxiety.  30 tablet  0  . methocarbamol (ROBAXIN) 500 MG tablet Take 1 tablet (500 mg total) by mouth every 8 (eight) hours as needed for muscle spasms.  30 tablet  0  . Multiple Vitamin (MULTIVITAMIN WITH MINERALS) TABS tablet Take 1 tablet by mouth daily.      . nicotine (NICODERM CQ - DOSED IN MG/24 HOURS) 14 mg/24hr patch Place 1 patch (14 mg total) onto the skin daily.  28 patch  0  . thiamine 100 MG tablet Take 1 tablet (100 mg total) by mouth daily.      Marland Kitchen venlafaxine XR (EFFEXOR-XR) 75 MG 24 hr capsule Take 1 capsule (75 mg total) by mouth daily with breakfast.  30 capsule  0    Psychiatric Specialty Exam:     Blood pressure 120/75, pulse 78, temperature 97.4 F (36.3 C), temperature source Oral, resp. rate 18, height 5\' 11"  (1.803 m), SpO2  98.00%.There is no weight on file to calculate BMI.  General Appearance: Disheveled  Eye Solicitor::  None  Speech:  Clear and Coherent and Normal Rate  Volume:  Normal  Mood:  Angry and Irritable  Affect:  Blunt  Thought Process:  Circumstantial  Orientation:  Full (Time, Place, and Person)  Thought Content:  "I don't even know why I'm here"  Suicidal Thoughts:  No  Homicidal Thoughts:  No  Memory:  Immediate;   Good Recent;   Good  Judgement:  Fair  Insight:  Shallow  Psychomotor Activity:  Normal  Concentration:  Fair  Recall:  Fair  Akathisia:  No  Handed:  Right  AIMS (if indicated):     Assets:  Leisure Time  Sleep:      Treatment Plan Summary: Discharge  Disposition:  Discharge home; Give patient resources for outpatient resources medication management assistance and Therapy.  Assunta Found, FNP-BC 09/29/2013 12:16 PM

## 2013-09-29 NOTE — Progress Notes (Signed)
Pt mother called, stating that she had spoken with the [place]. CSW ifnormed mother that patient would have to make that decision. CSW received call from the  [place] who were going to be faxing a screening form. Pt states is he willing to go to the [place] for detox and treatment. CSW awaiting the fax and will assist with pt disposition plan.   Catha Gosselin, LCSW [phone]  ED CSW .09/29/2013 1324pm

## 2013-09-29 NOTE — Consult Note (Signed)
Note reviewed and agreed with  

## 2013-09-29 NOTE — ED Notes (Signed)
Spoke with mother concerning POC.  Belongings secure.  Support offered and referred to [employer].

## 2013-09-29 NOTE — ED Provider Notes (Addendum)
Per psych, sw, patient is stable for d/c.  Gerhard Munch, MD 09/29/13 1255  3:14 PM Patient is now going to [facility] - awaiting bed  Gerhard Munch, MD 09/29/13 1514

## 2013-09-29 NOTE — Progress Notes (Addendum)
TTS counselor gave VA treatment agreement and information form to review, and informed pt that TTS or CSW would come back later to go over the information and ask some additional questions to complete the application.  CSW attempted to complete VA application with pt.  Pt was uncooperative and irritable.  When CSW asked pt if he had read the papers that were left with him, he reported " I'll get it done before 8 am in the morning".  CSW attempted to explain to pt that we needed  to complete the information prior to the am, so that it could faxed over to the [place] for consideration.  Pt yells at CSW "Well I guess we are on your time now, fine".   Pt signed the agreement without reviewing it, and covered his head with his sheet.  CSW attempted to go over questions on the the [place] referral form with pt.   Pt answered the first five questions concerning legal issues.  When CSW started to ask additional questions concerning family history, pt reports that "they are all fucked up".  When CSW attempted to get the patient to clarify his answer, he started to get belligerent and yelled "why don't you go ask them or just look it up in the chart?".   CSW attempted to explain to pt that CSW was just attempting to assist him to with a referral he stated that he wanted. CSW explained to pt that if he did not want to complete the information or be referred to the [place] that he didn't have to be, but he could not continue to yell at CSW.  CSW did not attempt to complete any more of the referral form with pt. And informed him that CSW would just document that he was no longer interested in his [place] application.   Pt just covered his head with sheet again.   CSW will leave incomplete document in pts file for possible completion by oncoming TTS/CSW.  Marva Panda, Theresia Majors  [phone]  .09/29/2013   8:45 pm   CSW informed by nurse that pt now wants to leave.   Will review pts IVC paperwork and consult with TTS and EDP to see if pt can be  discharged.    Nurse Gabriel Cirri following up with TTS concerning the IVC paperwork, and will let CSW know response so CSW can consult with EDP.   Marland KitchenKarle Plumber  [phone] 09/29/2013

## 2013-09-29 NOTE — ED Notes (Signed)
Informed psych providers of need for first opinion.

## 2013-09-29 NOTE — Progress Notes (Addendum)
Per discussion with psychiatrist, patient psychiatrically stable for discharge home. Pt does not meet IVC criteria. CSW provided patient with outpatient resources. CSW informed RN and EDP.   Marland KitchenCatha Gosselin, [place] [phone]  ED CSW .09/29/2013 12:22pm

## 2013-09-29 NOTE — BH Assessment (Addendum)
Assessment Note  Curtis Blankenship is a 32 y.o. male who presents via involuntary petition initiated by his mother.  Per Dr. Wilkie Aye, did not feel that pt was appropriate for IVC commitment.  IVC petition was rescinded by Dr. Wilkie Aye.  Pt reports the following: he is SI--"I don't have the guts to harm myself".  Pt says he has SI thoughts "all the time", but denies any current plan or intent, however he had 1 previous SI attempt, last year by overdose.  Pt denies HI/AVH.  Pt says he was engaging in outpatient services with [facility], however has been off medications for 8 mos and has not seen a psychiatrist. Pt is homeless and states that he is having trouble with transportation to appointments.   Pt admits he is abusing alcohol and opiates.  Pt has past admissions with [facility] for detox/rehab/SI/Depression/PTSD.  Pt consumes 2 cases of beer daily, last drink was 09/28/13, drank 1 case. Pt uses opiates(vivodin, percocet).  Pt takes 30mg s, daily last use was 09/27/13, he used 30mg s.  Pt denies any issues with seizures, endorses blackouts. Pt c/o w/d sxs: tremors, sweats, cramps and nausea. 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 Per petition, mother states pt has poor sleeping, eating and hygiene habits.  Pt is hostile and aggressive, however has been cooperative while in [employer]. Pt is irritable.         Axis I: Major Depression, Recurrent severe, Post Traumatic Stress Disorder and Opiate Dependence; Alcohol Dependence  Axis II: Deferred Axis III:  Past Medical History  Diagnosis Date  . Bipolar disorder   . PTSD (post-traumatic stress disorder)   . Depression    Axis IV: other psychosocial or environmental problems, problems related to social environment, problems with access to health care services and problems with primary support group Axis V: 31-40 impairment in reality testing  Past Medical History:  Past Medical History  Diagnosis  Date  . Bipolar disorder   . PTSD (post-traumatic stress disorder)   . Depression     Past Surgical History  Procedure Laterality Date  . Appendectomy      Family History:  Family History  Problem Relation Age of Onset  . Hypertension Mother   . Alcoholism Other     Social History:  reports that he has been smoking Cigarettes.  He has a 15 pack-year smoking history. He has quit using smokeless tobacco. His smokeless tobacco use included Snuff. He reports that he drinks about 97.2 ounces of alcohol per week. He reports that he uses illicit drugs (Cocaine, Other-see comments, and Benzodiazepines) about 3 times per week.  Additional Social History:  Substance #1 Name of Substance 1: Alcohol  1 - Age of First Use: Teens  1 - Amount (size/oz): 2 Cases  1 - Frequency: Daily  1 - Duration: On-going  1 - Last Use / Amount: 09/28/13 Substance #2 Name of Substance 2: Opiates(Vicodin, Percocet)  2 - Age of First Use: 20's  2 - Amount (size/oz): 30mg   2 - Frequency: Daily  2 - Duration: On-going  2 - Last Use / Amount: 09/27/13  CIWA: CIWA-Ar BP: 112/75 mmHg Pulse Rate: 68 Nausea and Vomiting: 3 Tactile Disturbances: none Tremor: three Auditory Disturbances: not present Paroxysmal Sweats: three Visual Disturbances: not present Anxiety: two Headache, Fullness in Head: none present Agitation: somewhat more than normal activity Orientation and Clouding of Sensorium: oriented and can do serial additions CIWA-Ar Total: 12 COWS:  Allergies: No Known Allergies  Home Medications:  (Not in a hospital admission)  OB/GYN Status:  No LMP for male patient.  General Assessment Data Location of Assessment: [facility] Is this a Tele or Face-to-Face Assessment?: Tele Assessment Is this an Initial Assessment or a Re-assessment for this encounter?: Initial Assessment Living Arrangements: Other (Comment) (Homeless ) Can pt return to current living arrangement?: Yes Admission Status:  Involuntary Is patient capable of signing voluntary admission?: Yes Transfer from: Acute Hospital Referral Source: MD  Medical Screening Exam [facility] Walk-in ONLY) Medical Exam completed: No Reason for MSE not completed: Other: (None )  [facility] Crisis Care Plan Living Arrangements: Other (Comment) (Homeless ) Name of Psychiatrist: [facility] [place] Name of Therapist: None   Education Status Is patient currently in school?: No Current Grade: None  Highest grade of school patient has completed: None  Name of school: None  Contact person: None   Risk to self Suicidal Ideation: Yes-Currently Present Suicidal Intent: Yes-Currently Present Is patient at risk for suicide?: Yes Suicidal Plan?: No-Not Currently/Within Last 6 Months Access to Means: No What has been your use of drugs/alcohol within the last 12 months?: Pt Abusing: Alcohol, Opiates  Previous Attempts/Gestures: Yes How many times?: 1 Other Self Harm Risks: None  Triggers for Past Attempts: Unpredictable Intentional Self Injurious Behavior: None Family Suicide History: No 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) Persecutory voices/beliefs?: No Depression: Yes Depression Symptoms: Loss of interest in usual pleasures;Feeling worthless/self pity Substance abuse history and/or treatment for substance abuse?: Yes Suicide prevention information given to non-admitted patients: Not applicable  Risk to Others Homicidal Ideation: No Thoughts of Harm to Others: No Current Homicidal Intent: No Current Homicidal Plan: No Access to Homicidal Means: No Identified Victim: None  History of harm to others?: No Assessment of Violence: None Noted Violent Behavior Description: None  Does patient have access to weapons?: No Criminal Charges Pending?: No Does patient have a court date: No  Psychosis Hallucinations: None noted Delusions: None noted  Mental Status Report Appear/Hygiene: Disheveled;Poor  hygiene Eye Contact: Poor Motor Activity: Unremarkable Speech: Logical/coherent Level of Consciousness: Alert Mood: Depressed;Sad Affect: Depressed;Sad Anxiety Level: None Thought Processes: Coherent;Relevant Judgement: Impaired Orientation: Person;Place;Time;Situation Obsessive Compulsive Thoughts/Behaviors: None  Cognitive Functioning Concentration: Decreased Memory: Recent Intact;Remote Intact IQ: Average Insight: Poor Impulse Control: Poor Appetite: Fair Weight Loss: 0 Weight Gain: 0 Sleep: Decreased Total Hours of Sleep: 4 Vegetative Symptoms: None  ADLScreening [facility] Assessment Services) Patient's cognitive ability adequate to safely complete daily activities?: Yes Patient able to express need for assistance with ADLs?: Yes Independently performs ADLs?: Yes (appropriate for developmental age)  Prior Inpatient Therapy Prior Inpatient Therapy: Yes Prior Therapy Dates: 2012 [facility] Reason for Treatment: Detox/Rehab/SI/Depression   Prior Outpatient Therapy Prior Outpatient Therapy: Yes Prior Therapy Dates: 2014 Prior Therapy Facilty/Provider(s): [facility] [place]  Reason for Treatment: Med Mgt   ADL Screening (condition at time of admission) Patient's cognitive ability adequate to safely complete daily activities?: Yes Is the patient deaf or have difficulty hearing?: No Does the patient have difficulty seeing, even when wearing glasses/contacts?: No Does the patient have difficulty concentrating, remembering, or making decisions?: No Patient able to express need for assistance with ADLs?: Yes Does the patient have difficulty dressing or bathing?: No Independently performs ADLs?: Yes (appropriate for developmental age) Does the patient have difficulty walking or climbing stairs?: No Weakness of Legs: None Weakness of Arms/Hands: None  Home Assistive Devices/Equipment Home Assistive Devices/Equipment:  None  Therapy Consults (therapy consults require a physician order) PT Evaluation Needed: No OT Evalulation Needed: No SLP Evaluation Needed: No Abuse/Neglect Assessment (Assessment to be complete while patient is alone) Physical Abuse: Denies Verbal Abuse: Denies Sexual Abuse: Denies Exploitation of patient/patient's resources: Denies Self-Neglect: Denies Values / Beliefs Cultural Requests During Hospitalization: None Spiritual Requests During Hospitalization: None Consults Spiritual Care Consult Needed: No Social Work Consult Needed: No Merchant navy officer (For Healthcare) Advance Directive: Patient does not have advance directive;Patient would not like information Pre-existing out of facility DNR order (yellow form or pink MOST form): No Nutrition Screen- [facility] Adult/[facility] Patient's home diet: Regular  Additional Information 1:1 In Past 12 Months?: No CIRT Risk: No Elopement Risk: No Does patient have medical clearance?: Yes     Disposition:  Disposition Initial Assessment Completed for this Encounter: Yes Disposition of Patient: Inpatient treatment program;Referred to ([facility] with [facility] ) Type of inpatient treatment program: Adult Patient referred to: Other (Comment) (Pt requesting admission with [facility] )  On Site Evaluation by:   Reviewed with Physician:    Murrell Redden 09/29/2013 5:39 AM

## 2014-05-03 ENCOUNTER — Emergency Department (HOSPITAL_COMMUNITY)
Admission: EM | Admit: 2014-05-03 | Discharge: 2014-05-04 | Disposition: A | Discharge status: Home / Self Care | Payer: Non-veteran care | Attending: Emergency Medicine | Admitting: Emergency Medicine

## 2014-05-03 ENCOUNTER — Emergency Department (HOSPITAL_COMMUNITY): Payer: Non-veteran care

## 2014-05-03 ENCOUNTER — Encounter (HOSPITAL_COMMUNITY): Payer: Self-pay | Admitting: Emergency Medicine

## 2014-05-03 DIAGNOSIS — F10239 Alcohol dependence with withdrawal, unspecified: Secondary | ICD-10-CM | POA: Insufficient documentation

## 2014-05-03 DIAGNOSIS — F10939 Alcohol use, unspecified with withdrawal, unspecified: Secondary | ICD-10-CM | POA: Insufficient documentation

## 2014-05-03 DIAGNOSIS — R45851 Suicidal ideations: Secondary | ICD-10-CM | POA: Insufficient documentation

## 2014-05-03 DIAGNOSIS — F1110 Opioid abuse, uncomplicated: Secondary | ICD-10-CM | POA: Insufficient documentation

## 2014-05-03 DIAGNOSIS — F1410 Cocaine abuse, uncomplicated: Secondary | ICD-10-CM | POA: Insufficient documentation

## 2014-05-03 DIAGNOSIS — M546 Pain in thoracic spine: Secondary | ICD-10-CM | POA: Insufficient documentation

## 2014-05-03 DIAGNOSIS — F411 Generalized anxiety disorder: Secondary | ICD-10-CM | POA: Insufficient documentation

## 2014-05-03 DIAGNOSIS — F10230 Alcohol dependence with withdrawal, uncomplicated: Secondary | ICD-10-CM

## 2014-05-03 DIAGNOSIS — F17200 Nicotine dependence, unspecified, uncomplicated: Secondary | ICD-10-CM | POA: Insufficient documentation

## 2014-05-03 DIAGNOSIS — F1020 Alcohol dependence, uncomplicated: Secondary | ICD-10-CM | POA: Insufficient documentation

## 2014-05-03 DIAGNOSIS — R079 Chest pain, unspecified: Secondary | ICD-10-CM | POA: Insufficient documentation

## 2014-05-03 LAB — COMPREHENSIVE METABOLIC PANEL
ALT: 36 U/L (ref 0–53)
AST: 37 U/L (ref 0–37)
Albumin: 4 g/dL (ref 3.5–5.2)
Alkaline Phosphatase: 104 U/L (ref 39–117)
Anion gap: 14 (ref 5–15)
BILIRUBIN TOTAL: 0.8 mg/dL (ref 0.3–1.2)
BUN: 11 mg/dL (ref 6–23)
CHLORIDE: 94 meq/L — AB (ref 96–112)
CO2: 28 mEq/L (ref 19–32)
Calcium: 9.6 mg/dL (ref 8.4–10.5)
Creatinine, Ser: 1.02 mg/dL (ref 0.50–1.35)
GLUCOSE: 96 mg/dL (ref 70–99)
Potassium: 3.8 mEq/L (ref 3.7–5.3)
Sodium: 136 mEq/L — ABNORMAL LOW (ref 137–147)
Total Protein: 7 g/dL (ref 6.0–8.3)

## 2014-05-03 LAB — URINALYSIS, ROUTINE W REFLEX MICROSCOPIC
Bilirubin Urine: NEGATIVE
GLUCOSE, UA: NEGATIVE mg/dL
HGB URINE DIPSTICK: NEGATIVE
KETONES UR: NEGATIVE mg/dL
Leukocytes, UA: NEGATIVE
Nitrite: NEGATIVE
PROTEIN: NEGATIVE mg/dL
Specific Gravity, Urine: 1.021 (ref 1.005–1.030)
Urobilinogen, UA: 1 mg/dL (ref 0.0–1.0)
pH: 5.5 (ref 5.0–8.0)

## 2014-05-03 LAB — I-STAT TROPONIN, ED: Troponin i, poc: 0 ng/mL (ref 0.00–0.08)

## 2014-05-03 LAB — RAPID URINE DRUG SCREEN, HOSP PERFORMED
AMPHETAMINES: NOT DETECTED
Barbiturates: NOT DETECTED
Benzodiazepines: NOT DETECTED
Cocaine: POSITIVE — AB
Opiates: NOT DETECTED
TETRAHYDROCANNABINOL: NOT DETECTED

## 2014-05-03 LAB — CBC
HCT: 45.5 % (ref 39.0–52.0)
HEMOGLOBIN: 15.2 g/dL (ref 13.0–17.0)
MCH: 31.5 pg (ref 26.0–34.0)
MCHC: 33.4 g/dL (ref 30.0–36.0)
MCV: 94.2 fL (ref 78.0–100.0)
PLATELETS: 292 10*3/uL (ref 150–400)
RBC: 4.83 MIL/uL (ref 4.22–5.81)
RDW: 14.4 % (ref 11.5–15.5)
WBC: 5.6 10*3/uL (ref 4.0–10.5)

## 2014-05-03 LAB — ACETAMINOPHEN LEVEL: Acetaminophen (Tylenol), Serum: <15 ug/mL (ref 10–30)

## 2014-05-03 LAB — ETHANOL: Alcohol, Ethyl (B): <11 mg/dL (ref 0–11)

## 2014-05-03 LAB — SALICYLATE LEVEL: Salicylate Lvl: <2 mg/dL — ABNORMAL LOW (ref 2.8–20.0)

## 2014-05-03 MED ORDER — ONDANSETRON HCL 4 MG PO TABS
4.0000 mg | ORAL_TABLET | Freq: Three times a day (TID) | ORAL | Status: DC | PRN
  Administered 2014-05-03 – 2014-05-04 (×2): 4 mg via ORAL
  Filled 2014-05-03 (×2): qty 1

## 2014-05-03 MED ORDER — NICOTINE 21 MG/24HR TD PT24: 21.0000 mg | MEDICATED_PATCH | Freq: Every day | TRANSDERMAL | Status: DC

## 2014-05-03 MED ORDER — ALUM & MAG HYDROXIDE-SIMETH 200-200-20 MG/5ML PO SUSP: 30.0000 mL | ORAL | Status: DC | PRN

## 2014-05-03 MED ORDER — LORAZEPAM 1 MG PO TABS
1.0000 mg | ORAL_TABLET | Freq: Three times a day (TID) | ORAL | Status: DC | PRN
  Administered 2014-05-03 – 2014-05-04 (×2): 1 mg via ORAL
  Filled 2014-05-03 (×2): qty 1

## 2014-05-03 MED ORDER — IBUPROFEN 200 MG PO TABS: 600.0000 mg | ORAL_TABLET | Freq: Three times a day (TID) | ORAL | Status: DC | PRN

## 2014-05-03 MED ORDER — ZOLPIDEM TARTRATE 5 MG PO TABS: 5.0000 mg | ORAL_TABLET | Freq: Every evening | ORAL | Status: DC | PRN

## 2014-05-03 NOTE — ED Provider Notes (Signed)
CSN: [id number]     Arrival date & time 05/03/14  1620 History  This chart was scribed for non-physician practitioner, Johnnette Gourdobyn Albert, PA-C working with Hurman HornJohn M Bednar, MD by Greggory StallionKayla Andersen, ED scribe. This patient was seen in room WTR2/WLPT2 and the patient's care was started at 5:10 PM.   Chief Complaint  Patient presents with  . Alcohol Problem  . Suicidal   The history is provided by the patient. No language interpreter was used.   HPI Comments: Curtis Blankenship is a 33 y.o. male who presents to the Emergency Department requesting alcohol detox. Pt has detoxed in the past and states he gets DTs. The last time he detoxed was about 6 months ago. He is unsure how much he drinks per day but states it is a lot. Pt smokes cigarettes daily and reports cocaine and opiate use. His last cocaine use was one day ago. States he has suicidal ideations if he doesn't get help with detoxing and states he will take more drugs. Pt reports chest pains and mid back pain that started last night. Pain not as bad today. Denies abdominal pain, nausea, emesis.   Past Medical History  Diagnosis Date  . Bipolar disorder   . PTSD (post-traumatic stress disorder)   . Depression    Past Surgical History  Procedure Laterality Date  . Appendectomy     Family History  Problem Relation Age of Onset  . Hypertension Mother   . Alcoholism Other    History  Substance Use Topics  . Smoking status: Current Every Day Smoker -- 1.50 packs/day for 10 years    Types: Cigarettes  . Smokeless tobacco: Former NeurosurgeonUser    Types: Snuff  . Alcohol Use: 97.2 oz/week    150 Cans of beer, 12 Shots of liquor per week     Comment: he drinks every day as much as he can    Review of Systems  Cardiovascular: Positive for chest pain.  Gastrointestinal: Negative for nausea, vomiting and abdominal pain.  Musculoskeletal: Positive for back pain.  Psychiatric/Behavioral: Positive for suicidal ideas.  All other systems reviewed and are  negative.  Allergies  Review of patient's allergies indicates no known allergies.  Home Medications   Prior to Admission medications   Not on File   BP 135/80  Pulse 84  Resp 16  SpO2 99%  Physical Exam  Nursing note and vitals reviewed. Constitutional: He is oriented to person, place, and time. He appears well-developed and well-nourished. No distress.  Rambling speech. Anxious.   HENT:  Head: Normocephalic and atraumatic.  Eyes: Conjunctivae and EOM are normal.  Neck: Normal range of motion. Neck supple.  Cardiovascular: Normal rate, regular rhythm and normal heart sounds.   Pulmonary/Chest: Effort normal and breath sounds normal.  Abdominal: Soft. There is no tenderness.  Musculoskeletal: Normal range of motion. He exhibits no edema.  Neurological: He is alert and oriented to person, place, and time.  Skin: Skin is warm and dry.  Psychiatric: He has a normal mood and affect. His behavior is normal.    ED Course  Procedures (including critical care time)  DIAGNOSTIC STUDIES: Oxygen Saturation is 99% on RA, normal by my interpretation.    COORDINATION OF CARE: 5:14 PM-Discussed treatment plan which includes lab work with pt at bedside and pt agreed to plan.   Labs Review Labs Reviewed  CBC  COMPREHENSIVE METABOLIC PANEL  URINALYSIS, ROUTINE W REFLEX MICROSCOPIC  URINE RAPID DRUG SCREEN (HOSP PERFORMED)  ETHANOL  SALICYLATE  LEVEL  ACETAMINOPHEN LEVEL  I-STAT TROPOININ, ED    Imaging Review No results found.   EKG Interpretation None      MDM   Final diagnoses:  Alcohol dependence with uncomplicated withdrawal    Patient presenting for detox. He admits to alcohol abuse, cocaine abuse and opioid abuse. History of DTs. States he had chest pain last night. Labs pending. Will obtain chest pain workup given cocaine abuse. He is nontoxic appearing and in no apparent distress. Anxious. Vital signs stable. TTS consult.  Medically cleared.  I personally  performed the services described in this documentation, which was scribed in my presence. The recorded information has been reviewed and is accurate.  Trevor MaceRobyn M Albert, PA-C 05/05/14 [id number]

## 2014-05-03 NOTE — ED Notes (Signed)
Patient resting in position of comfort with eyes closed RR WNL--even and unlabored with equal rise and fall of chest Patient in NAD Side rails up, call bell in reach  

## 2014-05-03 NOTE — ED Notes (Addendum)
Pt requesting detox from alcohol,sts binge drinking for the past 3 weeks, at least 24 beers a day plus liquor. Also reports using cocaine and heroin. Last drink was this morning. Pt sts tried quitting before and goes through "violent DTs". Pt sts if he doesn't get help he will kill himself (overdose on drugs).

## 2014-05-03 NOTE — ED Notes (Signed)
Patient and belongings have been wanded by security. Patient bag (1 of 1) is at the [facility] nursing station. TCU lockers are full.

## 2014-05-04 NOTE — ED Notes (Addendum)
Patient now awake and with c/o "shakiness, feeling nauseated" VS updated and stable Patient to be medicated with PRN meds, per orders--see MAR CIWA score completed Patient in NAD

## 2014-05-04 NOTE — Consult Note (Signed)
  Review of Systems  Constitutional: Negative.   HENT: Negative.   Eyes: Negative.   Respiratory: Negative.   Cardiovascular: Negative.   Gastrointestinal: Negative.   Genitourinary: Negative.   Musculoskeletal: Negative.   Skin: Negative.   Neurological: Negative.   Endo/Heme/Allergies: Negative.   Psychiatric/Behavioral: Positive for depression and substance abuse.    

## 2014-05-04 NOTE — ED Notes (Signed)
[facility] called for transport. 

## 2014-05-04 NOTE — ED Notes (Signed)
Patient resting in position of comfort with eyes closed RR WNL--even and unlabored with equal rise and fall of chest Patient in NAD Side rails up, call bell in reach  

## 2014-05-04 NOTE — ED Notes (Signed)
Pt asked for sandwich, said his stomach felt unsettled and said putting something on it may help him feel better. He was given a ham sandwich.   Pt also c/o shakiness, sweating, and feeling anxious.

## 2014-05-04 NOTE — ED Notes (Signed)
Pt's [employer] [place] card released to pt's mother. Pt verbally agreed to release this card. Other belongings remain at nurses station.

## 2014-05-04 NOTE — ED Notes (Signed)
Berna SpareMarcus with [facility] at bedside to speak with patient

## 2014-05-04 NOTE — ED Provider Notes (Signed)
Medical screening examination/treatment/procedure(s) were performed by non-physician practitioner and as supervising physician I was immediately available for consultation/collaboration.   EKG Interpretation   Date/Time:  Monday May 03 2014 17:28:22 EDT Ventricular Rate:  83 PR Interval:  101 QRS Duration: 95 QT Interval:  367 QTC Calculation: 431 R Axis:   74 Text Interpretation:  Sinus rhythm Short PR interval ST elev, probable  normal early repol pattern No significant change since last tracing  Confirmed by [facility]  MD, Jonny RuizJOHN ([id number]) on 05/03/2014 5:38:27 PM       Hurman HornJohn M Lorey Pallett, MD 05/04/14 1355

## 2014-05-04 NOTE — Consult Note (Signed)
[facility] Consult   Reason for Consult:  Requesting detox with threats to kill himself if he does not get help Referring Physician:  ER MD  Curtis Blankenship is an 33 y.o. male. Total Time spent with patient: 30 minutes  Assessment: AXIS I:  alcohol dependence. cocaine and opioid abuse AXIS II:  Deferred AXIS III:   Past Medical History  Diagnosis Date  . Bipolar disorder   . PTSD (post-traumatic stress disorder)   . Depression    AXIS IV:  economic problems, housing problems and chronic addiction AXIS V:  51-60 moderate symptoms  Plan:  Recommend psychiatric Inpatient admission when medically cleared.  Subjective:   Curtis Blankenship is a 33 y.o. male patient admitted with requesting detox.  HPI:  Curtis Blankenship says he drinks at least 24 beers daily as well as using opioids and cocaine when he can get them.  He is not currently suicidal but has said on admission that he would be suicidal if he does not get help.  He reportedly has 100 per cent disability from the [employer] with a head injury and PTSD.  He is homeless and says he has no transportation to get to the [place]. His last detox was in November and he was sober for about 6 months he believes. HPI Elements:   Location:  alcohol dependence. Quality:  drinks daily. Severity:  if he does not get help says he will kill himself. Timing:  has decided he has got to stop. Duration:  since he was discharged from the [employer]. Context:  as above.  Past Psychiatric History: Past Medical History  Diagnosis Date  . Bipolar disorder   . PTSD (post-traumatic stress disorder)   . Depression     reports that he has been smoking Cigarettes.  He has a 15 pack-year smoking history. He has quit using smokeless tobacco. His smokeless tobacco use included Snuff. He reports that he drinks about 97.2 ounces of alcohol per week. He reports that he uses illicit drugs (Cocaine, Other-see comments, and Benzodiazepines) about 3 times per  week. Family History  Problem Relation Age of Onset  . Hypertension Mother   . Alcoholism Other    Family History Substance Abuse: No Family Supports: No (Pt states, "No so much help.") Living Arrangements: Other (Comment) (Pt is homeless) Can pt return to current living arrangement?: Yes Abuse/Neglect [facility]) Physical Abuse: Denies Verbal Abuse: Denies Sexual Abuse: Denies Allergies:  No Known Allergies  ACT Assessment Complete:  Yes:    Educational Status    Risk to Self: Risk to self Suicidal Ideation: Yes-Currently Present Suicidal Intent: No Is patient at risk for suicide?: No Suicidal Plan?: No Access to Means: No What has been your use of drugs/alcohol within the last 12 months?: ETOH & cocaine binge Previous Attempts/Gestures: Yes How many times?: 1 Other Self Harm Risks: N/A Triggers for Past Attempts: Unknown Intentional Self Injurious Behavior: None Family Suicide History: No Recent stressful life event(s): Conflict (Comment) (Parents put him out b/c of SA issues) Persecutory voices/beliefs?: No Depression: Yes Depression Symptoms: Despondent;Insomnia;Isolating;Fatigue;Guilt;Loss of interest in usual pleasures Substance abuse history and/or treatment for substance abuse?: Yes Suicide prevention information given to non-admitted patients: Not applicable  Risk to Others: Risk to Others Homicidal Ideation: No Thoughts of Harm to Others: No Current Homicidal Intent: No Current Homicidal Plan: No Access to Homicidal Means: No Identified Victim: No one History of harm to others?: No Assessment of Violence: None Noted Violent Behavior Description: Pt denies Does patient  have access to weapons?: No Criminal Charges Pending?: No Does patient have a court date: No  Abuse: Abuse/Neglect Assessment (Assessment to be complete while patient is alone) Physical Abuse: Denies Verbal Abuse: Denies Sexual Abuse: Denies Exploitation of patient/patient's resources:  Denies Self-Neglect: Denies  Prior Inpatient Therapy: Prior Inpatient Therapy Prior Inpatient Therapy: Yes Prior Therapy Dates: Nov '14; May ^ Feb '14 Prior Therapy Facilty/Provider(s): [place]; [facility] Reason for Treatment: Detox  Prior Outpatient Therapy: Prior Outpatient Therapy Prior Outpatient Therapy: Yes Prior Therapy Dates: Last 2 years Prior Therapy Facilty/Provider(s): [facility] Blank[facility]hip at [facility] Reason for Treatment: med monitoring twice yearly  Additional Information: Additional Information 1:1 In Past 12 Months?: No CIRT Risk: No Elopement Risk: No Does patient have medical clearance?: Yes                  Objective: Blood pressure 106/60, pulse 59, temperature 98.1 F (36.7 C), temperature source Oral, resp. rate 17, SpO2 97.00%.There is no weight on file to calculate BMI. Results for orders placed during the hospital encounter of 05/03/14 (from the past 72 hour(s))  CBC     Status: None   Collection Time    05/03/14  5:16 PM      Result Value Ref Range   WBC 5.6  4.0 - 10.5 K/uL   RBC 4.83  4.22 - 5.81 MIL/uL   Hemoglobin 15.2  13.0 - 17.0 g/dL   HCT 45.5  39.0 - 52.0 %   MCV 94.2  78.0 - 100.0 fL   MCH 31.5  26.0 - 34.0 pg   MCHC 33.4  30.0 - 36.0 g/dL   RDW 14.4  11.5 - 15.5 %   Platelets 292  150 - 400 K/uL  COMPREHENSIVE METABOLIC PANEL     Status: Abnormal   Collection Time    05/03/14  5:16 PM      Result Value Ref Range   Sodium 136 (*) 137 - 147 mEq/L   Potassium 3.8  3.7 - 5.3 mEq/L   Chloride 94 (*) 96 - 112 mEq/L   CO2 28  19 - 32 mEq/L   Glucose, Bld 96  70 - 99 mg/dL   BUN 11  6 - 23 mg/dL   Creatinine, Ser 1.02  0.50 - 1.35 mg/dL   Calcium 9.6  8.4 - 10.5 mg/dL   Total Protein 7.0  6.0 - 8.3 g/dL   Albumin 4.0  3.5 - 5.2 g/dL   AST 37  0 - 37 U/L   ALT 36  0 - 53 U/L   Alkaline Phosphatase 104  39 - 117 U/L   Total Bilirubin 0.8  0.3 - 1.2 mg/dL   GFR calc non Af Amer >90  >90 mL/min   GFR calc Af Amer >90  >90 mL/min   Comment:  (NOTE)     The eGFR has been calculated using the CKD EPI equation.     This calculation has not been validated in all clinical situations.     eGFR's persistently <90 mL/min signify possible Chronic Kidney     Disease.   Anion gap 14  5 - 15  ETHANOL     Status: None   Collection Time    05/03/14  5:16 PM      Result Value Ref Range   Alcohol, Ethyl (B) <11  0 - 11 mg/dL   Comment:            LOWEST DETECTABLE LIMIT FOR     SERUM  ALCOHOL IS 11 mg/dL     FOR MEDICAL PURPOSES ONLY  SALICYLATE LEVEL     Status: Abnormal   Collection Time    05/03/14  5:16 PM      Result Value Ref Range   Salicylate Lvl <3.7 (*) 2.8 - 20.0 mg/dL  ACETAMINOPHEN LEVEL     Status: None   Collection Time    05/03/14  5:16 PM      Result Value Ref Range   Acetaminophen (Tylenol), Serum <15.0  10 - 30 ug/mL   Comment:            THERAPEUTIC CONCENTRATIONS VARY     SIGNIFICANTLY. A RANGE OF 10-30     ug/mL MAY BE AN EFFECTIVE     CONCENTRATION FOR MANY PATIENTS.     HOWEVER, SOME ARE BEST TREATED     AT CONCENTRATIONS OUTSIDE THIS     RANGE.     ACETAMINOPHEN CONCENTRATIONS     >150 ug/mL AT 4 HOURS AFTER     INGESTION AND >50 ug/mL AT 12     HOURS AFTER INGESTION ARE     OFTEN ASSOCIATED WITH TOXIC     REACTIONS.  URINALYSIS, ROUTINE W REFLEX MICROSCOPIC     Status: Abnormal   Collection Time    05/03/14  5:32 PM      Result Value Ref Range   Color, Urine AMBER (*) YELLOW   Comment: BIOCHEMICALS MAY BE AFFECTED BY COLOR   APPearance CLEAR  CLEAR   Specific Gravity, Urine 1.021  1.005 - 1.030   pH 5.5  5.0 - 8.0   Glucose, UA NEGATIVE  NEGATIVE mg/dL   Hgb urine dipstick NEGATIVE  NEGATIVE   Bilirubin Urine NEGATIVE  NEGATIVE   Ketones, ur NEGATIVE  NEGATIVE mg/dL   Protein, ur NEGATIVE  NEGATIVE mg/dL   Urobilinogen, UA 1.0  0.0 - 1.0 mg/dL   Nitrite NEGATIVE  NEGATIVE   Leukocytes, UA NEGATIVE  NEGATIVE   Comment: MICROSCOPIC NOT DONE ON URINES WITH NEGATIVE PROTEIN, BLOOD, LEUKOCYTES,  NITRITE, OR GLUCOSE <1000 mg/dL.  URINE RAPID DRUG SCREEN (HOSP PERFORMED)     Status: Abnormal   Collection Time    05/03/14  5:32 PM      Result Value Ref Range   Opiates NONE DETECTED  NONE DETECTED   Cocaine POSITIVE (*) NONE DETECTED   Benzodiazepines NONE DETECTED  NONE DETECTED   Amphetamines NONE DETECTED  NONE DETECTED   Tetrahydrocannabinol NONE DETECTED  NONE DETECTED   Barbiturates NONE DETECTED  NONE DETECTED   Comment:            DRUG SCREEN FOR MEDICAL PURPOSES     ONLY.  IF CONFIRMATION IS NEEDED     FOR ANY PURPOSE, NOTIFY LAB     WITHIN 5 DAYS.                LOWEST DETECTABLE LIMITS     FOR URINE DRUG SCREEN     Drug Class       Cutoff (ng/mL)     Amphetamine      1000     Barbiturate      200     Benzodiazepine   169     Tricyclics       678     Opiates          300     Cocaine          300     [facility]C  [place], ED     Status: None   Collection Time    05/03/14  5:44 PM      Result Value Ref Range   Troponin i, poc 0.00  0.00 - 0.08 ng/mL   Comment 3            Comment: Due to the release kinetics of cTnI,     a negative result within the first hours     of the onset of symptoms does not rule out     myocardial infarction with certainty.     If myocardial infarction is still suspected,     repeat the test at appropriate intervals.   Labs are reviewed and are pertinent for cocaine but not alcohol.  Current Facility-Administered Medications  Medication Dose Route Frequency Provider Last Rate Last Dose  . alum & mag hydroxide-simeth (MAALOX/MYLANTA) 200-200-20 MG/5ML suspension 30 mL  30 mL Oral PRN Illene Labrador, PA-C      . ibuprofen (ADVIL,MOTRIN) tablet 600 mg  600 mg Oral Q8H PRN Illene Labrador, PA-C      . LORazepam (ATIVAN) tablet 1 mg  1 mg Oral Q8H PRN Illene Labrador, PA-C   1 mg at 05/04/14 0423  . nicotine (NICODERM CQ - dosed in mg/24 hours) patch 21 mg  21 mg Transdermal Daily Robyn M Albert, PA-C      . ondansetron  [facility]) tablet 4 mg  4 mg Oral Q8H PRN Illene Labrador, PA-C   4 mg at 05/04/14 0423  . zolpidem (AMBIEN) tablet 5 mg  5 mg Oral QHS PRN Illene Labrador, PA-C       Current Outpatient Prescriptions  Medication Sig Dispense Refill  . sertraline (ZOLOFT) 100 MG tablet Take 100 mg by mouth daily.      . traZODone (DESYREL) 50 MG tablet Take 50 mg by mouth at bedtime.        Psychiatric Specialty Exam:     Blood pressure 106/60, pulse 59, temperature 98.1 F (36.7 C), temperature source Oral, resp. rate 17, SpO2 97.00%.There is no weight on file to calculate BMI.  General Appearance: Casual  Eye Contact::  Poor  Speech:  Clear and Coherent  Volume:  Normal  Mood:  Irritable  Affect:  Appropriate  Thought Process:  Coherent  Orientation:  Full (Time, Place, and Person)  Thought Content:  Negative  Suicidal Thoughts:  No  Homicidal Thoughts:  No  Memory:  Immediate;   Good Recent;   Good Remote;   Good  Judgement:  Fair  Insight:  Lacking  Psychomotor Activity:  Normal  Concentration:  Good  Recall:  Good  Fund of Knowledge:Good  Language: Good  Akathisia:  Negative  Handed:  Right  AIMS (if indicated):     Assets:  Communication Skills  Sleep:      Musculoskeletal: Strength & Muscle Tone: within normal limits Gait & Station: normal Patient leans: N/A  Treatment Plan Summary: Has been accepted to [facility] for treatment of alcohol detox  Othel Hoogendoorn D 05/04/2014 3:10 PM

## 2014-05-04 NOTE — ED Notes (Signed)
Patient resting in position of comfort with eyes closed s/p admin of nausea and anxiety medications, see MAR RR WNL--even and unlabored with equal rise and fall of chest Patient in NAD Side rails up, call bell in reach

## 2014-05-04 NOTE — Progress Notes (Signed)
Patient has been accepted [facility] by Dr. Wendall StadeKohl.   Samuella BruinKristin Daleigh Pollinger, MSW, [facility]ick[facility]WA Clinical Social Worker [facility] [phone]

## 2014-05-04 NOTE — BH Assessment (Signed)
Assessment Note  Curtis Blankenship is an 33 y.o. male.  -Clinician reviewed note written by PA [name].  Dr. Read Drivers is on but unfamiliar with patient.  [name] said in her note that patient is seeking detox from ETOH because he has a history of DTs.  Patient does desire detox from ETOH.  He had last drink in the early morning on 07/06.  He drinks at least 24 beers per day for the last 4 weeks.  Patient said that he had been sober since November of 2014.  He started drinking with friends and when parents found out, they kicked him out of the house.  Patient is homeless and he really started drinking a lot then.  Patient also uses cocaine.  Last use of cocaine was 07/05.  Patient has vague SI with no plan.  Patient has had one attempt in the past.  He endorses depression and reports losing 20 lbs in the last month.  He attributes this to cocaine use and depression.  Patient denies any HI or A/V hallucinations.  -Patient care discussed with Donell Sievert, PA who recommended patient for inpatient care.  There are no detox beds available at [facility] at this time.  [facility] [place] [facility] and generally do detox only as a dual dx situation.  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 Bipolar disorder   . PTSD (post-traumatic stress disorder)   . Depression    Axis IV: economic problems, housing problems, occupational problems, other psychosocial or environmental problems and problems with primary support group Axis V: 31-40 impairment in reality testing  Past Medical History:  Past Medical History  Diagnosis Date  . Bipolar disorder   . PTSD (post-traumatic stress disorder)   . Depression     Past Surgical History  Procedure Laterality Date  . Appendectomy      Family History:  Family History  Problem Relation Age of Onset   . Hypertension Mother   . Alcoholism Other     Social History:  reports that he has been smoking Cigarettes.  He has a 15 pack-year smoking history. He has quit using smokeless tobacco. His smokeless tobacco use included Snuff. He reports that he drinks about 97.2 ounces of alcohol per week. He reports that he uses illicit drugs (Cocaine, Other-see comments, and Benzodiazepines) about 3 times per week.  Additional Social History:  Alcohol / Drug Use Pain Medications: See PTA medication list Prescriptions: Psychiatric meds Zoloft & Trazadone Over the Counter: See PTA medication list History of alcohol / drug use?: Yes Negative Consequences of Use: Personal relationships Withdrawal Symptoms: DTs;Diarrhea;Cramps;Fever / Chills;Irritability;Nausea / Vomiting;Patient aware of relationship between substance abuse and physical/medical complications;Sweats;Tremors;Weakness Substance #1 Name of Substance 1: ETOH.  Beer primarily 1 - Age of First Use: 33 years of age 38 - Amount (size/oz): 24+ beers per day 1 - Frequency: Daily use for last 4 weeks 1 - Duration: Last 4 weeks 1 - Last Use / Amount: 07/06  Pt unclear about amount Substance #2 Name of Substance 2: Cocaine (both crack & powder) 2 - Age of First Use: 33 years of age 85 - Amount (size/oz): $100 worh per day 2 - Frequency: Daily use over last 2 weeks 2 - Duration: Last two weeks 2 - Last Use / Amount: 07/06  CIWA: CIWA-Ar BP: 114/72  mmHg Pulse Rate: 63 Nausea and Vomiting: no nausea and no vomiting Tactile Disturbances: none Tremor: no tremor Auditory Disturbances: not present Paroxysmal Sweats: no sweat visible Visual Disturbances: not present Anxiety: no anxiety, at ease (Patient sleeping, see notes) Headache, Fullness in Head: none present Agitation: normal activity Orientation and Clouding of Sensorium: oriented and can do serial additions CIWA-Ar Total: 0 COWS:    Allergies: No Known Allergies  Home Medications:   (Not in a hospital admission)  OB/GYN Status:  No LMP for male patient.  General Assessment Data Location of Assessment: [facility] Is this a Tele or Face-to-Face Assessment?: Face-to-Face Is this an Initial Assessment or a Re-assessment for this encounter?: Initial Assessment Living Arrangements: Other (Comment) (Pt is homeless) Can pt return to current living arrangement?: Yes Admission Status: Voluntary Is patient capable of signing voluntary admission?: Yes Transfer from: Acute Hospital Referral Source: Self/Family/Friend     [facility] Living Arrangements: Other (Comment) (Pt is homeless) Name [facility] Psychiatrist[facility] Name of Therapist: None     Risk to self Suicidal Ideation: Yes-Currently Present Suicidal Intent: No Is patient at risk for suicide?: No Suicidal Plan?: No Access to Means: No What has been your use of drugs/alcohol within the last 12 months?: ETOH & cocaine binge Previous Attempts/Gestures: Yes How many times?: 1 Other Self Harm Risks: N/A Triggers for Past Attempts: Unknown Intentional Self Injurious Behavior: None Family Suicide History: No Recent stressful life event(s): Conflict (Comment) (Parents put him out b/c of SA issues) Persecutory voices/beliefs?: No Depression: Yes Depression Symptoms: Despondent;Insomnia;Isolating;Fatigue;Guilt;Loss of interest in usual pleasures Substance abuse history and/or treatment for substance abuse?: Yes Suicide prevention information given to non-admitted patients: Not applicable  Risk to Others Homicidal Ideation: No Thoughts of Harm to Others: No Current Homicidal Intent: No Current Homicidal Plan: No Access to Homicidal Means: No Identified Victim: No one History of harm to others?: No Assessment of Violence: None Noted Violent Behavior Description: Pt denies Does patient have access to weapons?: No Criminal Charges Pending?: No Does patient have a court date:  No  Psychosis Hallucinations: None noted Delusions: None noted  Mental Status Report Appear/Hygiene: Disheveled;In scrubs Eye Contact: Poor (Pt kept head covered with bedsheet most of the interview.) Motor Activity: Freedom of movement;Unremarkable Speech: Logical/coherent Level of Consciousness: Quiet/awake Mood: Depressed;Anxious;Helpless;Sad Affect: Anxious;Appropriate to circumstance;Depressed Anxiety Level: Panic Attacks Panic attack frequency: 2-3x/W Most recent panic attack: Yesterday Thought Processes: Coherent;Relevant Judgement: Unimpaired Orientation: Person;Place;Time;Situation Obsessive Compulsive Thoughts/Behaviors: Minimal  Cognitive Functioning Concentration: Decreased Memory: Recent Impaired;Remote Impaired IQ: Average Insight: Good Impulse Control: Poor Appetite: Poor Weight Loss:  (20-30 lbs in last month.) Weight Gain: 0 Sleep: Decreased Total Hours of Sleep:  (<4H/D) Vegetative Symptoms: None  ADLScreening [facility]) Patient's cognitive ability adequate to safely complete daily activities?: Yes Patient able to express need for assistance with ADLs?: Yes Independently performs ADLs?: Yes (appropriate for developmental age)  Prior Inpatient Therapy Prior Inpatient Therapy: Yes Prior Therapy Dates: Nov '14; May ^ Feb '14 [facility] Reason for Treatment: Detox  Prior Outpatient Therapy Prior Outpatient Therapy: Yes Prior Therapy Dates: Last 2 years Prior [facility] Reason for Treatment: med monitoring twice yearly  ADL Screening (condition at time of admission) Patient's cognitive ability adequate to safely complete daily activities?: Yes Is the patient deaf or have difficulty hearing?: No Does the patient have difficulty seeing, even when wearing glasses/contacts?: No Does the patient have difficulty  concentrating, remembering, or making decisions?:  Yes Patient able to express need for assistance with ADLs?: Yes Does the patient have difficulty dressing or bathing?: No Independently performs ADLs?: Yes (appropriate for developmental age) Does the patient have difficulty walking or climbing stairs?: No Weakness of Legs: None Weakness of Arms/Hands: None       Abuse/Neglect Assessment (Assessment to be complete while patient is alone) Physical Abuse: Denies Verbal Abuse: Denies Sexual Abuse: Denies Exploitation of patient/patient's resources: Denies Self-Neglect: Denies Values / Beliefs Cultural Requests During Hospitalization: None Spiritual Requests During Hospitalization: None   Advance Directives (For Healthcare) Advance Directive: Patient does not have advance directive;Patient would not like information Pre-existing out of facility DNR order (yellow form or pink MOST form): No    Additional Information 1:1 In Past 12 Months?: No CIRT Risk: No Elopement Risk: No Does patient have medical clearance?: Yes     Disposition:  Disposition Initial Assessment Completed for this Encounter: Yes Disposition of Patient: Inpatient treatment program;Referred to Type of inpatient treatment program: Adult Patient referred to:  Karleen [facility]([place] recommended inpatient care.  No detox beds available)  On Site Evaluation by:   Reviewed with Physician:    Beatriz StallionHarvey, Iyesha Such Ray 05/04/2014 5:08 AM

## 2014-05-04 NOTE — ED Notes (Signed)
Patient's belongings sent with the patient.

## 2015-07-03 ENCOUNTER — Emergency Department (HOSPITAL_COMMUNITY)
Admission: EM | Admit: 2015-07-03 | Discharge: 2015-07-03 | Disposition: A | Discharge status: Home / Self Care | Payer: Medicare Other | Attending: Emergency Medicine | Admitting: Emergency Medicine

## 2015-07-03 ENCOUNTER — Encounter (HOSPITAL_COMMUNITY): Payer: Self-pay

## 2015-07-03 DIAGNOSIS — F1210 Cannabis abuse, uncomplicated: Secondary | ICD-10-CM | POA: Insufficient documentation

## 2015-07-03 DIAGNOSIS — F1910 Other psychoactive substance abuse, uncomplicated: Secondary | ICD-10-CM

## 2015-07-03 DIAGNOSIS — Z720 Tobacco use: Secondary | ICD-10-CM | POA: Diagnosis not present

## 2015-07-03 DIAGNOSIS — F1310 Sedative, hypnotic or anxiolytic abuse, uncomplicated: Secondary | ICD-10-CM | POA: Diagnosis not present

## 2015-07-03 DIAGNOSIS — F1410 Cocaine abuse, uncomplicated: Secondary | ICD-10-CM | POA: Diagnosis not present

## 2015-07-03 DIAGNOSIS — F1010 Alcohol abuse, uncomplicated: Secondary | ICD-10-CM | POA: Diagnosis present

## 2015-07-03 NOTE — ED Provider Notes (Signed)
CSN: [id number]     Arrival date & time 07/03/15  1247 History  This chart was scribed for non-physician practitioner Melburn Hake, PA-C working with Mirian Mo, MD by Leone Payor, ED Scribe. This patient was seen in room [facility] and the patient's care was started at 2:37 PM.    Chief Complaint  Patient presents with  . Alcohol Problem    The history is provided by the patient. No language interpreter was used.     HPI Comments: Curtis Blankenship is a 34 y.o. male who presents to the Emergency Department requesting alcohol detox today. Patient states he last sought treatment 1 year ago. He would like to get started with detox and behavioral health to potentially get placed in a long term rehab facility. He states his last alcoholic drink was 10-11 hours ago. He also reports using illicit drugs such as cocaine, marijuana, and benzo's. He reports having shakiness, restlessness, nausea, vomiting with prior episodes of detox. He currently denies those symptoms or any other associated sxs.   Past Medical History  Diagnosis Date  . Bipolar disorder   . PTSD (post-traumatic stress disorder)   . Depression    Past Surgical History  Procedure Laterality Date  . Appendectomy     Family History  Problem Relation Age of Onset  . Hypertension Mother   . Alcoholism Other    Social History  Substance Use Topics  . Smoking status: Current Every Day Smoker -- 1.50 packs/day for 10 years    Types: Cigarettes  . Smokeless tobacco: Former Neurosurgeon    Types: Snuff  . Alcohol Use: 97.2 oz/week    150 Cans of beer, 12 Shots of liquor per week     Comment: he drinks every day as much as he can    Review of Systems  Constitutional:       +alcohol detox  Gastrointestinal: Negative for nausea, vomiting and abdominal pain.  Neurological: Negative for tremors.      Allergies  Review of patient's allergies indicates no known allergies.  Home Medications   Prior to Admission medications   Not  on File   BP 145/97 mmHg  Pulse 95  Temp(Src) 98 F (36.7 C) (Oral)  Resp 20  SpO2 100% Physical Exam  Constitutional: He is oriented to person, place, and time. He appears well-developed and well-nourished.  HENT:  Head: Normocephalic and atraumatic.  Mouth/Throat: Oropharynx is clear and moist.  Eyes: Conjunctivae and EOM are normal. Pupils are equal, round, and reactive to light. Right eye exhibits no discharge. Left eye exhibits no discharge. No scleral icterus.  Neck: Normal range of motion. Neck supple.  Cardiovascular: Normal rate, regular rhythm, normal heart sounds and intact distal pulses.  Exam reveals no gallop and no friction rub.   No murmur heard. Pulmonary/Chest: Effort normal and breath sounds normal. No respiratory distress. He has no wheezes. He has no rales.  Abdominal: Soft. Bowel sounds are normal. He exhibits no distension and no mass. There is no tenderness. There is no rebound and no guarding.  Musculoskeletal: Normal range of motion. He exhibits no edema or tenderness.  Lymphadenopathy:    He has no cervical adenopathy.  Neurological: He is alert and oriented to person, place, and time. He exhibits normal muscle tone. Coordination normal.  Skin: Skin is warm and dry.  Psychiatric: He has a normal mood and affect. Judgment normal.  Nursing note and vitals reviewed.   ED Course  Procedures   DIAGNOSTIC STUDIES: Oxygen Saturation  is 100% on RA, normal by my interpretation.    COORDINATION OF CARE: 2:43 PM Discussed treatment plan with pt at bedside and pt agreed to plan.   Labs Review Labs Reviewed - No data to display  Imaging Review No results found.  Filed Vitals:   07/03/15 1259  BP: 145/97  Pulse: 95  Temp: 98 F (36.7 C)  Resp: 20     MDM   Final diagnoses:  Alcohol abuse  Drug abuse    Pt presents for alcohol detox. Denies any symptoms. CIWA score 0. Pt does not currently appear to be intoxicated. Last drink last night. Pt does  not meet criteria for inpatient treatment. Pt was given [employer]. Discussed treatment with lithium, pt reports he cannot guarantee not drinking anything while taking the Lithium. Both the patient and I agreed the best treatment plan would be to not provide the pt with Lithium for DTs due to compliance problems.   I personally performed the services described in this documentation, which was scribed in my presence. The recorded information has been reviewed and is accurate.   Satira Sark [place], [place] 07/03/15 1511  Mirian Mo, MD 07/04/15 1302

## 2015-07-03 NOTE — ED Notes (Signed)
Pt. Leaves upon being given the resource form by our P.A.

## 2015-07-03 NOTE — Discharge Instructions (Signed)
°Emergency Department Resource Guide °1) Find a Doctor and Pay Out of Pocket °Although you won't have to find out who is covered by your insurance plan, it is a good idea to ask around and get recommendations. You will then need to call the office and see if the doctor you have chosen will accept you as a new patient and what types of options they offer for patients who are self-pay. Some doctors offer discounts or will set up payment plans for their patients who do not have insurance, but you will need to ask so you aren't surprised when you get to your appointment. ° °2) Contact Your Local Health Department °Not all health departments have doctors that can see patients for sick visits, but many do, so it is worth a call to see if yours does. If you don't know where your local health department is, you can check in your phone book. The CDC also has a tool to help you locate your state's health department, and many state websites also have listings of all of their local health departments. ° °3) Find a Walk-in Clinic °If your illness is not likely to be very severe or complicated, you may want to try a walk in clinic. These are popping up all over the country in pharmacies, drugstores, and shopping centers. They're usually staffed by nurse practitioners or physician assistants that have been trained to treat common illnesses and complaints. They're usually fairly quick and inexpensive. However, if you have serious medical issues or chronic medical problems, these are probably not your best option. ° °No Primary Care Doctor: °- Call [facility] at  832-8000 - they can help you locate a primary care doctor that  accepts your insurance, provides certain services, etc. °- Physician Referral Service- 1-800-533-3463 ° °Chronic Pain Problems: °Organization         Address  Phone   Notes  °[facility]  (336) 297-2271 Patients need to be referred by their primary care doctor.  ° °Medication  Assistance: °Organization         Address  Phone   Notes  °[place] [facility] [address] °[place], [place] [address] (336) 641-8030 --Must be a resident of [place] °-- Must have NO insurance coverage whatsoever (no Medicaid/ Medicare, etc.) °-- The pt. MUST have a primary care doctor that directs their care regularly and follows them in the community °  °[facility]  (866) 331-1348   °[employer]  (888) 892-1162   ° °Agencies that provide inexpensive medical care: °Organization         Address  Phone   Notes  °[facility]  (336) 832-8035   °[facility]    (336) 832-7272   °[facility] [address] °[place], [place] [address] (336) 832-4777   °[facility] [address], °[place] (336) 271-4999   °[facility]    (336) 373-0678   °[facility]    (336) 272-1050   °[facility] ° [address], [place] Phone:  (336) 832-4444, Fax:  (336) 832-4440 Hours of Operation:  9 am - 6 pm, M-F.  Also accepts Medicaid/Medicare and self-pay.  °[facility] ° [address], [place] Phone: (336) 832-3150, Fax: (336) 832-3151. Hours of Operation:  8:30 am - 5:30 pm, M-F.  Also accepts Medicaid and self-pay.  °[facility] [place] [address], [place] Phone: (336) 878-6027   °[facility] [address], [place], [place] (336)723-1848, Ext. 123 Mondays & Thursdays: 7-9 AM.  First 15 patients are seen on a first come, first serve basis. °  ° °Medicaid-accepting [place] Providers: ° °Organization         Address  Phone   Notes  °[facility] [address], [place] (336) 641-2100 Also accepts self-pay patients.  °[facility] [address], [place] ° (336) 856-9996   °[facility] [address], [place]  (336) 288-8857   °[facility] [address] [place] [address], [place] (336) 299-7000   °Veita Bland [address], [place]  ° (336) 373-1557 Only accepts [place] Access Medicaid patients after they have their name applied to their card.  ° °Self-Pay (no insurance) in [place]: ° °Organization         Address  Phone   Notes  °Sickle Cell Patients, [facility] [address], [place] (336) 832-1970   °[facility] [address], [place] (336) 832-4400   °[facility] ° [address], [place] (336) 992-4800   °[facility]/Dr. Osei-Bonsu ° [address] [place] [address], [place] or [address], [place] (336) 841-8500 Phone number for both [place] and [place] locations is the same.  °[facility] [address], [place] (336) 299-0000   °[facility] [address] [place] [address], [place] or [address] (336) 852-7530 °(336) 878-2260   °[facility] [address], [place] (336) 350-1642, phone; (336) 294-5005, fax Sees patients 1st and 3rd Saturday of every month.  Must not qualify for public or private insurance (i.e. Medicaid, Medicare, [place] Health Choice, Veterans' Benefits) • Household income should be no more than 200% of the poverty level •The clinic cannot treat you if you are pregnant or think you are pregnant • Sexually transmitted diseases are not treated at the clinic.  ° ° °Dental Care: °Organization         Address  Phone  Notes  °[place] [facility] [address], [place] (336) 641-6152 Accepts children up to age 21 who are enrolled in Medicaid or [place] Health Choice; pregnant women with a Medicaid card; and children who have applied for Medicaid or [place] Health Choice, but were declined, whose parents can pay a reduced fee at time of service.  °[place]  [facility] [place]  [address], [place] (336) 641-7733 Accepts children up to age 21 who are enrolled in Medicaid or [place] Health Choice; pregnant women with a Medicaid card; and children who have applied for Medicaid or [place] Health Choice, but were declined, whose parents can pay a reduced fee at time of service.  °[facility] ° [address],  (336) 641-4533 Patients are seen by appointment only. Walk-ins are not accepted. [facility] will see patients 18 years of age and older. °Monday - Tuesday (8am-5pm) °Most Wednesdays (8:30-5pm) °$30 per visit, cash only  °[facility] ° [address], [place] (336) 641-4533 Patients are seen by appointment only. Walk-ins are not accepted. [facility] will see patients 18 years of age and older. °One   Wednesday Evening (Monthly: Volunteer Based).  $30 per visit, cash only  °[facility]  (919) 537-3737 for adults; Children under age 4, call Graduate Pediatric Dentistry at (919) [phone]. Children aged 4-14, please call (919) [phone] to request a pediatric application. ° Dental services are provided in all areas of dental care including fillings, crowns and bridges, complete and partial dentures, implants, gum treatment, root canals, and extractions. Preventive care is also provided. Treatment is provided to both adults and children. °Patients are selected via a lottery and there is often a waiting list. °  °[facility] [address], °[place] ° (336) 763-8833 [email or web address] °  °[facility] [address], [place], [place] (336)723-1848, Ext. 123 Second and Fourth Thursday of each month, opens at 6:30 AM; Clinic ends at 9 AM.  Patients are seen on a first-come first-served basis, and a limited number are seen during each clinic.  ° °[facility] ° [address], [place], [place] (336) 723-7904    Eligibility Requirements °You must have lived in [place], or [place] counties for at least the last three months. °  You cannot be eligible for state or federal sponsored healthcare insurance, including Veterans Administration, Medicaid, or Medicare. °  You generally cannot be eligible for healthcare insurance through your employer.  °  How to apply: °Eligibility screenings are held every Tuesday and Wednesday afternoon from 1:00 pm until 4:00 pm. You do not need an appointment for the interview!  °[facility] [address], [place], [place] 336-631-2330   °[facility] [facility]  336-342-8273   °[facility]  336-703-3100   °[facility]  336-570-6415   ° °Behavioral Health Resources in the Community: °Intensive Outpatient Programs °Organization         Address  Phone  Notes  °[facility] [address], [place], [place] 336-878-6098   °[facility] [address], [place], [place] 336-832-9800   °[facility] [address], [place], [place] ° 336-882-2125   °[facility] [address],  °[place], [place] 1-800-853-5163 or 336-641-4981   °Substance Abuse Resources °Organization         Address  Phone  Notes  °[facility]  336-882-2125   °[facility]  336-784-9470   °[facility]  336-285-9073   °[facility]  336-845-3988   °Residential & Outpatient Substance Abuse Program  1-800-659-3381   °Psychological Services °Organization         Address  Phone  Notes  ° Health  336- 832-9600   °[facility]  336- 378-7881   °[facility] [address], [place] 1-800-853-5163 or 336-641-4981   ° °Mobile Crisis Teams °Organization         Address  Phone  Notes  °[facility], Mobile Crisis Care Unit  1-877-626-1772   °Assertive °[facility] ° [address]  [place], [place] 336-834-9664   °Sharon DeEsch [address] °[place] 336-554-5454   ° °Self-Help/Support Groups °Organization         Address  Phone             Notes  °[facility]  - variety of support groups  336- 373-1402 Call for more information  °Narcotics Anonymous (NA), [facility] [address], °[place]  2 meetings at this location  ° °  Residential Treatment Programs °Organization         Address  Phone  Notes  °[facility] [address],    °[place]  1-866-801-8205   °[facility] ° [address], [place], [place] 704-293-8524   °[facility] [address], [place] 336-845-3988 Admissions: 8am-3pm M-F  °[facility] [address]    °[place], [place] 336-841-1104   °[facility] [address], [place], [place] 336-379-7146   °[facility] [address]  °[place], [place] 336-285-9073   °[facility] Intensive Outpatient [address], [place], [place] 336-852-3033   °[facility] [address]  °[place], [place] 1-877-615-2722 or 336-784-9470   °[facility] ([facility] [address] [place], [place] 336-227-7417 Accepts Medicaid  °[facility] [address]  °[place] 1-800-659-3381 Substance Abuse/Addiction Treatment  ° °[place] Behavioral Health Resources °Organization         Address  Phone  Notes  °[facility]  (888) 581-9988   °Julie Brannon, PhD [address] [place], [place]   (336) 349-5553 or (336) 951-0000   °[facility]   [address] °[place], [place] (336) 349-4454   °[facility] [address], [place], [place] (336) 342-8316 Insurance/Medicaid/sponsorship through [facility]  °[facility] [address]                                    [place], [place] (336) 342-8316 Therapy/tele-psych/case    °[facility] [address]  ° [place],  (336) 349-2233    °Dr. Arfeen  (336) 349-4544   °[facility] [place]  [employer] [facility] [place] [facility] 1) [address],  °2) [address], [place] °3)  [address], [place] (336) 349-3220 °(336) 342-7768 ° °(336) 342-8140   °[place] [facility] (336) 342-1394 or (336) 342-3537 (After Hours)    ° ° °

## 2015-07-03 NOTE — ED Notes (Signed)
He is requesting assistance with ETOH detox.  He states his last drink was at about 0300 this morning. He is in no distress.

## 2015-08-02 ENCOUNTER — Emergency Department (HOSPITAL_COMMUNITY): Payer: Medicare Other

## 2015-08-02 ENCOUNTER — Inpatient Hospital Stay (HOSPITAL_COMMUNITY): Payer: Medicare Other

## 2015-08-02 ENCOUNTER — Inpatient Hospital Stay (HOSPITAL_COMMUNITY)
Admission: EM | Admit: 2015-08-02 | Discharge: 2015-08-03 | DRG: 917 | Disposition: A | Discharge status: Other Acute Inpatient Hospital | Payer: Medicare Other | Attending: Family Medicine | Admitting: Family Medicine

## 2015-08-02 ENCOUNTER — Encounter (HOSPITAL_COMMUNITY): Payer: Self-pay | Admitting: Emergency Medicine

## 2015-08-02 DIAGNOSIS — Z791 Long term (current) use of non-steroidal anti-inflammatories (NSAID): Secondary | ICD-10-CM | POA: Diagnosis not present

## 2015-08-02 DIAGNOSIS — H538 Other visual disturbances: Secondary | ICD-10-CM | POA: Diagnosis not present

## 2015-08-02 DIAGNOSIS — F319 Bipolar disorder, unspecified: Secondary | ICD-10-CM | POA: Diagnosis present

## 2015-08-02 DIAGNOSIS — F4310 Post-traumatic stress disorder, unspecified: Secondary | ICD-10-CM | POA: Diagnosis present

## 2015-08-02 DIAGNOSIS — Z8249 Family history of ischemic heart disease and other diseases of the circulatory system: Secondary | ICD-10-CM

## 2015-08-02 DIAGNOSIS — F14129 Cocaine abuse with intoxication, unspecified: Secondary | ICD-10-CM | POA: Diagnosis present

## 2015-08-02 DIAGNOSIS — T510X2A Toxic effect of ethanol, intentional self-harm, initial encounter: Principal | ICD-10-CM | POA: Diagnosis present

## 2015-08-02 DIAGNOSIS — F4312 Post-traumatic stress disorder, chronic: Secondary | ICD-10-CM

## 2015-08-02 DIAGNOSIS — R55 Syncope and collapse: Secondary | ICD-10-CM | POA: Diagnosis not present

## 2015-08-02 DIAGNOSIS — F1020 Alcohol dependence, uncomplicated: Secondary | ICD-10-CM | POA: Diagnosis present

## 2015-08-02 DIAGNOSIS — T405X2A Poisoning by cocaine, intentional self-harm, initial encounter: Secondary | ICD-10-CM | POA: Diagnosis present

## 2015-08-02 DIAGNOSIS — R45851 Suicidal ideations: Secondary | ICD-10-CM

## 2015-08-02 DIAGNOSIS — J690 Pneumonitis due to inhalation of food and vomit: Secondary | ICD-10-CM | POA: Diagnosis present

## 2015-08-02 DIAGNOSIS — F1010 Alcohol abuse, uncomplicated: Secondary | ICD-10-CM | POA: Diagnosis not present

## 2015-08-02 DIAGNOSIS — R112 Nausea with vomiting, unspecified: Secondary | ICD-10-CM | POA: Diagnosis present

## 2015-08-02 DIAGNOSIS — R4689 Other symptoms and signs involving appearance and behavior: Secondary | ICD-10-CM

## 2015-08-02 DIAGNOSIS — F10230 Alcohol dependence with withdrawal, uncomplicated: Secondary | ICD-10-CM | POA: Diagnosis not present

## 2015-08-02 DIAGNOSIS — F17210 Nicotine dependence, cigarettes, uncomplicated: Secondary | ICD-10-CM | POA: Diagnosis present

## 2015-08-02 DIAGNOSIS — R0789 Other chest pain: Secondary | ICD-10-CM | POA: Diagnosis present

## 2015-08-02 DIAGNOSIS — R4589 Other symptoms and signs involving emotional state: Secondary | ICD-10-CM

## 2015-08-02 DIAGNOSIS — F332 Major depressive disorder, recurrent severe without psychotic features: Secondary | ICD-10-CM | POA: Diagnosis not present

## 2015-08-02 DIAGNOSIS — E871 Hypo-osmolality and hyponatremia: Secondary | ICD-10-CM | POA: Diagnosis present

## 2015-08-02 DIAGNOSIS — F1410 Cocaine abuse, uncomplicated: Secondary | ICD-10-CM | POA: Diagnosis present

## 2015-08-02 LAB — I-STAT CHEM 8, ED
BUN: 10 mg/dL (ref 6–20)
CHLORIDE: 93 mmol/L — AB (ref 101–111)
CREATININE: 1 mg/dL (ref 0.61–1.24)
Calcium, Ion: 1.13 mmol/L (ref 1.12–1.23)
Glucose, Bld: 116 mg/dL — ABNORMAL HIGH (ref 65–99)
HEMATOCRIT: 43 % (ref 39.0–52.0)
HEMOGLOBIN: 14.6 g/dL (ref 13.0–17.0)
POTASSIUM: 3.7 mmol/L (ref 3.5–5.1)
Sodium: 129 mmol/L — ABNORMAL LOW (ref 135–145)
TCO2: 23 mmol/L (ref 0–100)

## 2015-08-02 LAB — CBC WITH DIFFERENTIAL/PLATELET
BASOS ABS: 0 10*3/uL (ref 0.0–0.1)
BASOS PCT: 0 %
Eosinophils Absolute: 0.1 10*3/uL (ref 0.0–0.7)
Eosinophils Relative: 1 %
HEMATOCRIT: 37.9 % — AB (ref 39.0–52.0)
HEMOGLOBIN: 13.4 g/dL (ref 13.0–17.0)
Lymphocytes Relative: 18 %
Lymphs Abs: 1.4 10*3/uL (ref 0.7–4.0)
MCH: 31.8 pg (ref 26.0–34.0)
MCHC: 35.4 g/dL (ref 30.0–36.0)
MCV: 90 fL (ref 78.0–100.0)
Monocytes Absolute: 0.8 10*3/uL (ref 0.1–1.0)
Monocytes Relative: 10 %
NEUTROS ABS: 5.3 10*3/uL (ref 1.7–7.7)
NEUTROS PCT: 71 %
Platelets: 317 10*3/uL (ref 150–400)
RBC: 4.21 MIL/uL — AB (ref 4.22–5.81)
RDW: 12.7 % (ref 11.5–15.5)
WBC: 7.6 10*3/uL (ref 4.0–10.5)

## 2015-08-02 LAB — COMPREHENSIVE METABOLIC PANEL
ALBUMIN: 4.1 g/dL (ref 3.5–5.0)
ALK PHOS: 91 U/L (ref 38–126)
ALT: 34 U/L (ref 17–63)
ALT: 32 U/L (ref 17–63)
AST: 39 U/L (ref 15–41)
AST: 33 U/L (ref 15–41)
Albumin: 4.6 g/dL (ref 3.5–5.0)
Alkaline Phosphatase: 105 U/L (ref 38–126)
Anion gap: 14 (ref 5–15)
Anion gap: 5 (ref 5–15)
BILIRUBIN TOTAL: 0.8 mg/dL (ref 0.3–1.2)
BILIRUBIN TOTAL: 1.6 mg/dL — AB (ref 0.3–1.2)
BUN: 10 mg/dL (ref 6–20)
BUN: 9 mg/dL (ref 6–20)
CO2: 22 mmol/L (ref 22–32)
CO2: 25 mmol/L (ref 22–32)
CREATININE: 0.87 mg/dL (ref 0.61–1.24)
CREATININE: 0.93 mg/dL (ref 0.61–1.24)
Calcium: 9.1 mg/dL (ref 8.9–10.3)
Calcium: 8.5 mg/dL — ABNORMAL LOW (ref 8.9–10.3)
Chloride: 93 mmol/L — ABNORMAL LOW (ref 101–111)
Chloride: 100 mmol/L — ABNORMAL LOW (ref 101–111)
GFR calc Af Amer: >60 mL/min (ref >60)
GLUCOSE: 94 mg/dL (ref 65–99)
Glucose, Bld: 115 mg/dL — ABNORMAL HIGH (ref 65–99)
POTASSIUM: 3.8 mmol/L (ref 3.5–5.1)
POTASSIUM: 4.3 mmol/L (ref 3.5–5.1)
Sodium: 129 mmol/L — ABNORMAL LOW (ref 135–145)
Sodium: 130 mmol/L — ABNORMAL LOW (ref 135–145)
TOTAL PROTEIN: 8.2 g/dL — AB (ref 6.5–8.1)
TOTAL PROTEIN: 7.3 g/dL (ref 6.5–8.1)

## 2015-08-02 LAB — CBC
HEMATOCRIT: 37.7 % — AB (ref 39.0–52.0)
HEMOGLOBIN: 13 g/dL (ref 13.0–17.0)
MCH: 31.3 pg (ref 26.0–34.0)
MCHC: 34.5 g/dL (ref 30.0–36.0)
MCV: 90.6 fL (ref 78.0–100.0)
Platelets: 294 10*3/uL (ref 150–400)
RBC: 4.16 MIL/uL — AB (ref 4.22–5.81)
RDW: 12.7 % (ref 11.5–15.5)
WBC: 6.8 10*3/uL (ref 4.0–10.5)

## 2015-08-02 LAB — TROPONIN I

## 2015-08-02 LAB — HIV ANTIBODY (ROUTINE TESTING W REFLEX): HIV Screen 4th Generation wRfx: NONREACTIVE

## 2015-08-02 LAB — RAPID URINE DRUG SCREEN, HOSP PERFORMED
Amphetamines: NOT DETECTED
BARBITURATES: NOT DETECTED
Benzodiazepines: NOT DETECTED
Cocaine: POSITIVE — AB
Opiates: NOT DETECTED
TETRAHYDROCANNABINOL: NOT DETECTED

## 2015-08-02 LAB — ACETAMINOPHEN LEVEL: Acetaminophen (Tylenol), Serum: <10 ug/mL — ABNORMAL LOW (ref 10–30)

## 2015-08-02 LAB — I-STAT TROPONIN, ED: Troponin i, poc: 0 ng/mL (ref 0.00–0.08)

## 2015-08-02 LAB — SALICYLATE LEVEL: Salicylate Lvl: <4 mg/dL (ref 2.8–30.0)

## 2015-08-02 LAB — ETHANOL: ALCOHOL ETHYL (B): 23 mg/dL — AB (ref <5)

## 2015-08-02 LAB — STREP PNEUMONIAE URINARY ANTIGEN: STREP PNEUMO URINARY ANTIGEN: NEGATIVE

## 2015-08-02 MED ORDER — LORAZEPAM 2 MG/ML IJ SOLN
1.0000 mg | Freq: Once | INTRAMUSCULAR | Status: AC
  Administered 2015-08-02: 1 mg via INTRAVENOUS
  Filled 2015-08-02: qty 1

## 2015-08-02 MED ORDER — LORAZEPAM 2 MG/ML IJ SOLN
0.0000 mg | Freq: Four times a day (QID) | INTRAMUSCULAR | Status: DC
Start: 2015-08-02 — End: 2015-08-03
  Administered 2015-08-03 (×2): 2 mg via INTRAVENOUS
  Filled 2015-08-02 (×2): qty 1

## 2015-08-02 MED ORDER — ENOXAPARIN SODIUM 40 MG/0.4ML ~~LOC~~ SOLN
40.0000 mg | SUBCUTANEOUS | Status: DC
  Administered 2015-08-02 – 2015-08-03 (×2): 40 mg via SUBCUTANEOUS
  Filled 2015-08-02 (×2): qty 0.4

## 2015-08-02 MED ORDER — LORAZEPAM 2 MG/ML IJ SOLN: 1.0000 mg | Freq: Four times a day (QID) | INTRAMUSCULAR | Status: DC | PRN

## 2015-08-02 MED ORDER — FOLIC ACID 1 MG PO TABS
1.0000 mg | ORAL_TABLET | Freq: Every day | ORAL | Status: DC
  Administered 2015-08-02 – 2015-08-03 (×2): 1 mg via ORAL
  Filled 2015-08-02 (×2): qty 1

## 2015-08-02 MED ORDER — VITAMIN B-1 100 MG PO TABS
100.0000 mg | ORAL_TABLET | Freq: Every day | ORAL | Status: DC
  Administered 2015-08-02 – 2015-08-03 (×2): 100 mg via ORAL
  Filled 2015-08-02 (×2): qty 1

## 2015-08-02 MED ORDER — LEVOFLOXACIN IN D5W 500 MG/100ML IV SOLN
500.0000 mg | Freq: Once | INTRAVENOUS | Status: AC
  Administered 2015-08-02: 500 mg via INTRAVENOUS
  Filled 2015-08-02: qty 100

## 2015-08-02 MED ORDER — ADULT MULTIVITAMIN W/MINERALS CH
1.0000 | ORAL_TABLET | Freq: Every day | ORAL | Status: DC
  Administered 2015-08-02 – 2015-08-03 (×2): 1 via ORAL
  Filled 2015-08-02 (×2): qty 1

## 2015-08-02 MED ORDER — LORAZEPAM 1 MG PO TABS: 1.0000 mg | ORAL_TABLET | Freq: Four times a day (QID) | ORAL | Status: DC | PRN

## 2015-08-02 MED ORDER — THIAMINE HCL 100 MG/ML IJ SOLN: 100.0000 mg | Freq: Every day | INTRAMUSCULAR | Status: DC

## 2015-08-02 MED ORDER — SODIUM CHLORIDE 0.9 % IV SOLN
3.0000 g | Freq: Four times a day (QID) | INTRAVENOUS | Status: DC
  Administered 2015-08-02 – 2015-08-03 (×4): 3 g via INTRAVENOUS
  Filled 2015-08-02 (×7): qty 3

## 2015-08-02 MED ORDER — LORAZEPAM 2 MG/ML IJ SOLN
0.0000 mg | Freq: Two times a day (BID) | INTRAMUSCULAR | Status: DC
Start: 2015-08-04 — End: 2015-08-03

## 2015-08-02 MED ORDER — PANTOPRAZOLE SODIUM 40 MG IV SOLR
40.0000 mg | Freq: Once | INTRAVENOUS | Status: AC
  Administered 2015-08-02: 40 mg via INTRAVENOUS
  Filled 2015-08-02: qty 40

## 2015-08-02 MED ORDER — SENNOSIDES-DOCUSATE SODIUM 8.6-50 MG PO TABS: 1.0000 | ORAL_TABLET | Freq: Every evening | ORAL | Status: DC | PRN

## 2015-08-02 MED ORDER — SODIUM CHLORIDE 0.9 % IV BOLUS (SEPSIS)
1000.0000 mL | Freq: Once | INTRAVENOUS | Status: AC
  Administered 2015-08-02: 1000 mL via INTRAVENOUS

## 2015-08-02 NOTE — Progress Notes (Signed)
ANTIBIOTIC CONSULT NOTE - INITIAL  Pharmacy Consult for Unasyn Indication: Aspiration Pneumonia  No Known Allergies  Patient Measurements: Height:  (180.3 cm) Weight: 167 lb 11.2 oz (76.068 kg) IBW/kg (Calculated) : 75.3 Adjusted Body Weight:   Vital Signs: Temp: 98.6 F (37 C) (10/04 0448) Temp Source: Oral (10/04 0448) BP: 140/85 mmHg (10/04 0448) Pulse Rate: 90 (10/04 0448) Intake/Output from previous day:   Intake/Output from this shift:    Labs:  Recent Labs  08/02/15 0156 08/02/15 0208  WBC 7.6  --   HGB 13.4 14.6  PLT 317  --   CREATININE 0.87 1.00   Estimated Creatinine Clearance: 114 mL/min (by C-G formula based on Cr of 1). No results for input(s): VANCOTROUGH, VANCOPEAK, VANCORANDOM, GENTTROUGH, GENTPEAK, GENTRANDOM, TOBRATROUGH, TOBRAPEAK, TOBRARND, AMIKACINPEAK, AMIKACINTROU, AMIKACIN in the last 72 hours.   Microbiology: No results found for this or any previous visit (from the past 720 hour(s)).  Medical History: Past Medical History  Diagnosis Date  . Bipolar disorder (HCC)   . PTSD (post-traumatic stress disorder)   . Depression     Medications:  Anti-infectives    Start     Dose/Rate Route Frequency Ordered Stop   08/02/15 0645  Ampicillin-Sulbactam (UNASYN) 3 g in sodium chloride 0.9 % 100 mL IVPB     3 g 100 mL/hr over 60 Minutes Intravenous 4 times per day 08/02/15 0633     08/02/15 0245  levofloxacin (LEVAQUIN) IVPB 500 mg     500 mg 100 mL/hr over 60 Minutes Intravenous  Once 08/02/15 0242 08/02/15 0427     Assessment: Patient with Aspiration Pneumonia.    Goal of Therapy:  Appropriate antibiotic dosing for renal function; eradication of infection   Plan:  Follow up culture results  Unasyn 3gm iv q6hr  Curtis Blankenship, Curtis Blankenship 08/02/2015,6:34 AM

## 2015-08-02 NOTE — Progress Notes (Signed)
Report called to [place], RN on 4W. Pt to transfer to 1440.

## 2015-08-02 NOTE — H&P (Signed)
[facility] History and Physical  Rafe Mackowski UJW:[id number] DOB: 1981/05/29 DOA: 08/02/2015  Referring physician: Dr.Palumbo. PCP: PROVIDER NOT IN SYSTEM None. Specialists: None.  Chief Complaint: Suicidal ideation.  HPI: Curtis Blankenship is a 34 y.o. male with history of alcohol and cocaine abuse presented to the ER because of suicidal ideation. Patient states he has been drinking alcohol everyday and last evening had taken extra dose of cocaine with intention of suicide. Patient states that he passed out following the last dose of cocaine. When he woke up he started developing chest pressure which lasted for 1 hour and resolved. Patient has been having some left-sided blurred vision since then. Patient hired a cab and came to the ER. Prior to which patient also had one episode of nausea vomiting. In the ER patient's labs revealed positive cocaine with mild hyponatremia and chest x-ray showing possible infiltrate. Patient was placed on suicide precaution and admitted for further management. On exam patient appears nonfocal. Denies any shortness of breath and chest pain at this time is resolved. Still complains of some left-sided blurred vision.   Review of Systems: As presented in the history of presenting illness, rest negative.  Past Medical History  Diagnosis Date  . Bipolar disorder (HCC)   . PTSD (post-traumatic stress disorder)   . Depression    Past Surgical History  Procedure Laterality Date  . Appendectomy     Social History:  reports that he has been smoking Cigarettes.  He has a 15 pack-year smoking history. He has quit using smokeless tobacco. His smokeless tobacco use included Snuff. He reports that he drinks about 97.2 oz of alcohol per week. He reports that he uses illicit drugs (Cocaine, Other-see comments, and Benzodiazepines). Where does patient live at home. Can patient participate in ADLs? Yes.  No Known Allergies  Family History:  Family History   Problem Relation Age of Onset  . Hypertension Mother   . Alcoholism Other       Prior to Admission medications   Medication Sig Start Date End Date Taking? Authorizing Provider  ibuprofen (ADVIL,MOTRIN) 200 MG tablet Take 200-400 mg by mouth every 6 (six) hours as needed (for pain.).   Yes Historical Provider, MD    Physical Exam: Filed Vitals:   08/02/15 0230 08/02/15 0300 08/02/15 0400 08/02/15 0448  BP: 138/76 131/73 108/70 140/85  Pulse: 97 95 106 90  Temp:    98.6 F (37 C)  TempSrc:    Oral  Resp: Height:     (1.803 m)  Weight:    76.068 kg (167 lb 11.2 oz)  SpO2: 97% 97% 97% 99%     General:  Moderately built and nourished.  Eyes: Anicteric no pallor.  ENT: No discharge from the ears eyes nose and mouth.  Neck: No mass felt. No neck rigidity.  Cardiovascular: S1-S2 heard.  Respiratory: No rhonchi or crepitations.  Abdomen: Soft nontender bowel sounds present.  Skin: No rash.  Musculoskeletal: No edema.  Psychiatric: Has depression with suicidal ideation.  Neurologic: Alert awake oriented to time place and person. Moves all extremities 5 x 5. Perla positive. No facial asymmetry.  Labs on Admission:  Basic Metabolic Panel:  Recent Labs Lab 08/02/15 0156 08/02/15 0208  NA 129* 129*  K 3.8 3.7  CL 93* 93*  CO2 22  --   GLUCOSE 115* 116*  BUN 10 10  CREATININE 0.87 1.00  CALCIUM 9.1  --    Liver Function Tests:  Recent Labs Lab 08/02/15 0156  AST 39  ALT 34  ALKPHOS 105  BILITOT 0.8  PROT 8.2*  ALBUMIN 4.6   No results for input(s): LIPASE, AMYLASE in the last 168 hours. No results for input(s): AMMONIA in the last 168 hours. CBC:  Recent Labs Lab 08/02/15 0156 08/02/15 0208  WBC 7.6  --   NEUTROABS 5.3  --   HGB 13.4 14.6  HCT 37.9* 43.0  MCV 90.0  --   PLT 317  --    Cardiac Enzymes: No results for input(s): CKTOTAL, CKMB, CKMBINDEX, TROPONINI in the last 168 hours.  BNP (last 3 results) No results  for input(s): BNP in the last 8760 hours.  ProBNP (last 3 results) No results for input(s): PROBNP in the last 8760 hours.  CBG: No results for input(s): GLUCAP in the last 168 hours.  Radiological Exams on Admission: Dg Chest 2 View  08/02/2015   CLINICAL DATA:  Overdose.  Vomiting.  Dyspnea.  EXAM: CHEST  2 VIEW  COMPARISON:  05/03/2014  FINDINGS: Mild linear left base opacities may be atelectatic. Early infectious infiltrate or aspiration cannot be excluded. The right lung is clear. There is no pleural effusion. Pulmonary vasculature is normal. Hilar and mediastinal contours are unremarkable.  IMPRESSION: Mild linear left base opacities. Early infectious infiltrate or aspiration cannot be excluded.   Electronically Signed   By: Ellery Plunk M.D.   On: 08/02/2015 02:17    EKG: Independently reviewed. Sinus tachycardia.  Assessment/Plan Principal Problem:   Suicidal ideation Active Problems:   Cocaine abuse   Hyponatremia   Alcohol abuse   Aspiration pneumonia (HCC)   Syncope   1. Depression with suicidal ideation - patient has been placed on suicide precaution with one is to 1 sitter. Consult psychiatry in a.m. 2. Syncope with chest pain after cocaine - probably related to cocaine. Closely observe in telemetry cycle cardiac markers. Patient is on when necessary Ativan. Aspirin. When necessary nitroglycerin. 3. Left-sided blurred vision - also may be related to cocaine. Check CT head and MRI brain.  4. Alcohol abuse - patient has been placed on CIWA protocol. 5. Possible aspiration pneumonia - patient is on Unasyn. Check Legionella and strep antigen and HIV status. 6. Hyponatremia - probably related to alcoholism but at this time I'm going to keep patient on gentle hydration and closely follow metabolic panel.  I have reviewed patient's old charts and labs. Personally reviewed EKG and chest x-ray.   DVT Prophylaxis Lovenox.  Code Status: Full code.  Family Communication:  Discussed with patient.  Disposition Plan: Admit to inpatient.    Alaisa Moffitt N. [facility] Pager [phone].  If 7PM-7AM, please contact night-coverage [email or web address] Password [email or web address] 08/02/2015, 5:58 AM

## 2015-08-02 NOTE — ED Notes (Signed)
Pt has in belonging bag:  Blue jeans, brown belt, black shoes, white socks, brown baseball cap, purple checkered shirt, black shirt, green bracelet, black beaded necklace, pack of [place] cigs, pink lighter, [employer] [place] debit card.

## 2015-08-02 NOTE — Consult Note (Signed)
[facility] Consult   Reason for Consult:  Cocaine intoxication, suicidal ideation, posttraumatic stress disorder and depression Referring Physician:  Dr. Karleen Hampshire Patient Identification: Curtis Blankenship MRN:  [id number] Principal Diagnosis: Suicidal ideation Diagnosis:   Patient Active Problem List   Diagnosis Date Noted  . Hyponatremia [E87.1] 08/02/2015  . Alcohol abuse [F10.10] 08/02/2015  . Suicidal ideation [R45.851] 08/02/2015  . Aspiration pneumonia ([place]) [J69.0] 08/02/2015  . Syncope [R55] 08/02/2015  . Blurred vision [H53.8]   . TIA (transient ischemic attack) [G45.9] 09/19/2013  . Disturbance of skin sensation [R20.9] 09/19/2013  . 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 Alcohol dependence ([place]) [F10.20] 12/01/2012  . Cocaine abuse [F14.10] 12/01/2012  . PTSD (post-traumatic stress disorder) [F43.10] 12/01/2012  . Depressive disorder [F32.9] 12/01/2012    Total Time spent with patient: 1 hour  Subjective:   Curtis Blankenship is a 34 y.o. male patient admitted with alcohol and cocaine abuse and suicide ideation.  HPI:  [name] is a 34 y.o. Male, 100% service-connected disabled veteran from [place] war, seen face-to-face for psychiatric consultation and evaluation of increased symptoms of substance abuse, depression, suicidal ideation and tried to overdose on cocaine then his life and came to the hospital with chest fine. Patient reported he ran out of his medication for posttraumatic stress disorder and started drinking alcohol up to one case of beer a day and also started using cocaine and overdosed with intention to end his life before coming to the hospital. Reportedly he was taken Zoloft and also clonazepam from the [place] hospital system in the past about 2 months ago. Patient endorses increased symptoms of depression, anxiety, paranoia, reexperiencing of trauma and nightmares. Patient continued to endorse symptoms of suicidal ideation and unable to contract for  safety during this visit. Patient appeared lying on his bed with the portable cardiac monitor, IV fluids with the poor grooming and eye contact. Patient urine drug screen is positive for cocaine and blood alcohol level is 23 on arrival. Patient reported occasionally use weed. Patient has no appetite alcohol withdrawal symptoms like nausea, vomiting, abdominal pain, sweating or shaking at this time. Patient appeared more withdrawn to decreased psychomotor activity, poor eye contact and loss of interest and ruminated with the suicidal ideation. Patient has denied homicidal ideation, auditory and visual hallucinations and delusions. Patient has preference of inpatient psychiatric hospitalization non-VA system at this time.  Past Psychiatric History: patient has multiple acute psychiatric hospitalizations in the past, reportedly admitted to [facility] hospital in [place] x twice and [place] hospital x once in [place]. Patient has 1 previous acute psychiatric hospitalization at old [place], [place] about a year ago.  Risk to Self: Is patient at risk for suicide?: Yes Risk to Others:   Prior Inpatient Therapy:   Prior Outpatient Therapy:    Past Medical History:  Past Medical History  Diagnosis Date  . Bipolar disorder ([place])   . PTSD (post-traumatic stress disorder)   . Depression     Past Surgical History  Procedure Laterality Date  . Appendectomy     Family History:  Family History  Problem Relation Age of Onset  . Hypertension Mother   . Alcoholism Other    Family Psychiatric  History: Reportedly patient has significant psychiatric history in the family especially maternal uncle and aunt . Social History:  History  Alcohol Use  . 97.2 oz/week  . 150 Cans of beer, 12 Shots of liquor per week    Comment: he drinks every day as much as  he can     History  Drug Use  . Yes  . Special: Cocaine, Other-see comments, Benzodiazepines    Social History   Social History  . Marital Status:  Single    Spouse Name: N/A  . Number of Children: N/A  . Years of Education: N/A   Social History Main Topics  . Smoking status: Current Every Day Smoker -- 1.50 packs/day for 10 years    Types: Cigarettes  . Smokeless tobacco: Former Systems developer    Types: Snuff  . Alcohol Use: 97.2 oz/week    150 Cans of beer, 12 Shots of liquor per week     Comment: he drinks every day as much as he can  . Drug Use: Yes    Special: Cocaine, Other-see comments, Benzodiazepines  . Sexual Activity: Not Asked   Other Topics Concern  . None   Social History Narrative   Additional Social History: Disabled veteran                          Allergies:  No Known Allergies  Labs:  Results for orders placed or performed during the hospital encounter of 08/02/15 (from the past 48 hour(s))  Comprehensive metabolic panel     Status: Abnormal   Collection Time: 08/02/15  1:56 AM  Result Value Ref Range   Sodium 129 (L) 135 - 145 mmol/L   Potassium 3.8 3.5 - 5.1 mmol/L   Chloride 93 (L) 101 - 111 mmol/L   CO2 22 22 - 32 mmol/L   Glucose, Bld 115 (H) 65 - 99 mg/dL   BUN 10 6 - 20 mg/dL   Creatinine, Ser 0.87 0.61 - 1.24 mg/dL   Calcium 9.1 8.9 - 10.3 mg/dL   Total Protein 8.2 (H) 6.5 - 8.1 g/dL   Albumin 4.6 3.5 - 5.0 g/dL   AST 39 15 - 41 U/L   ALT 34 17 - 63 U/L   Alkaline Phosphatase 105 38 - 126 U/L   Total Bilirubin 0.8 0.3 - 1.2 mg/dL   GFR calc non Af Amer >60 >60 mL/min   GFR calc Af Amer >60 >60 mL/min    Comment: (NOTE) The eGFR has been calculated using the CKD EPI equation. This calculation has not been validated in all clinical situations. eGFR's persistently <60 mL/min signify possible Chronic Kidney Disease.    Anion gap 14 5 - 15  Ethanol (ETOH)     Status: Abnormal   Collection Time: 08/02/15  1:56 AM  Result Value Ref Range   Alcohol, Ethyl (B) 23 (H) <5 mg/dL    Comment:        LOWEST DETECTABLE LIMIT FOR SERUM ALCOHOL IS 5 mg/dL FOR MEDICAL PURPOSES ONLY    Salicylate level     Status: None   Collection Time: 08/02/15  1:56 AM  Result Value Ref Range   Salicylate Lvl <6.2 2.8 - 30.0 mg/dL  Acetaminophen level     Status: Abnormal   Collection Time: 08/02/15  1:56 AM  Result Value Ref Range   Acetaminophen (Tylenol), Serum <10 (L) 10 - 30 ug/mL    Comment:        THERAPEUTIC CONCENTRATIONS VARY SIGNIFICANTLY. A RANGE OF 10-30 ug/mL MAY BE AN EFFECTIVE CONCENTRATION FOR MANY PATIENTS. HOWEVER, SOME ARE BEST TREATED AT CONCENTRATIONS OUTSIDE THIS RANGE. ACETAMINOPHEN CONCENTRATIONS >150 ug/mL AT 4 HOURS AFTER INGESTION AND >50 ug/mL AT 12 HOURS AFTER INGESTION ARE OFTEN ASSOCIATED WITH TOXIC REACTIONS.  CBC with Differential/Platelet     Status: Abnormal   Collection Time: 08/02/15  1:56 AM  Result Value Ref Range   WBC 7.6 4.0 - 10.5 K/uL   RBC 4.21 (L) 4.22 - 5.81 MIL/uL   Hemoglobin 13.4 13.0 - 17.0 g/dL   HCT 37.9 (L) 39.0 - 52.0 %   MCV 90.0 78.0 - 100.0 fL   MCH 31.8 26.0 - 34.0 pg   MCHC 35.4 30.0 - 36.0 g/dL   RDW 12.7 11.5 - 15.5 %   Platelets 317 150 - 400 K/uL   Neutrophils Relative % 71 %   Neutro Abs 5.3 1.7 - 7.7 K/uL   Lymphocytes Relative 18 %   Lymphs Abs 1.4 0.7 - 4.0 K/uL   Monocytes Relative 10 %   Monocytes Absolute 0.8 0.1 - 1.0 K/uL   Eosinophils Relative 1 %   Eosinophils Absolute 0.1 0.0 - 0.7 K/uL   Basophils Relative 0 %   Basophils Absolute 0.0 0.0 - 0.1 K/uL  I-stat troponin, ED     Status: None   Collection Time: 08/02/15  2:05 AM  Result Value Ref Range   Troponin i, poc 0.00 0.00 - 0.08 ng/mL   Comment 3            Comment: Due to the release kinetics of cTnI, a negative result within the first hours of the onset of symptoms does not rule out myocardial infarction with certainty. If myocardial infarction is still suspected, repeat the test at appropriate intervals.   I-stat chem 8, ed     Status: Abnormal   Collection Time: 08/02/15  2:08 AM  Result Value Ref Range   Sodium 129  (L) 135 - 145 mmol/L   Potassium 3.7 3.5 - 5.1 mmol/L   Chloride 93 (L) 101 - 111 mmol/L   BUN 10 6 - 20 mg/dL   Creatinine, Ser 1.00 0.61 - 1.24 mg/dL   Glucose, Bld 116 (H) 65 - 99 mg/dL   Calcium, Ion 1.13 1.12 - 1.23 mmol/L   TCO2 23 0 - 100 mmol/L   Hemoglobin 14.6 13.0 - 17.0 g/dL   HCT 43.0 39.0 - 52.0 %  Urine rapid drug screen (hosp performed) (Not at [facility])     Status: Abnormal   Collection Time: 08/02/15  4:31 AM  Result Value Ref Range   Opiates NONE DETECTED NONE DETECTED   Cocaine POSITIVE (A) NONE DETECTED   Benzodiazepines NONE DETECTED NONE DETECTED   Amphetamines NONE DETECTED NONE DETECTED   Tetrahydrocannabinol NONE DETECTED NONE DETECTED   Barbiturates NONE DETECTED NONE DETECTED    Comment:        DRUG SCREEN FOR MEDICAL PURPOSES ONLY.  IF CONFIRMATION IS NEEDED FOR ANY PURPOSE, NOTIFY LAB WITHIN 5 DAYS.        LOWEST DETECTABLE LIMITS FOR URINE DRUG SCREEN Drug Class       Cutoff (ng/mL) Amphetamine      1000 Barbiturate      200 Benzodiazepine   267 Tricyclics       124 Opiates          300 Cocaine          300 THC              50   Troponin I (q 6hr x 3)     Status: None   Collection Time: 08/02/15  7:01 AM  Result Value Ref Range   Troponin I <0.03 <0.031 ng/mL    Comment:  NO INDICATION OF MYOCARDIAL INJURY.   CBC     Status: Abnormal   Collection Time: 08/02/15  7:01 AM  Result Value Ref Range   WBC 6.8 4.0 - 10.5 K/uL   RBC 4.16 (L) 4.22 - 5.81 MIL/uL   Hemoglobin 13.0 13.0 - 17.0 g/dL   HCT 37.7 (L) 39.0 - 52.0 %   MCV 90.6 78.0 - 100.0 fL   MCH 31.3 26.0 - 34.0 pg   MCHC 34.5 30.0 - 36.0 g/dL   RDW 12.7 11.5 - 15.5 %   Platelets 294 150 - 400 K/uL  Comprehensive metabolic panel     Status: Abnormal   Collection Time: 08/02/15  7:01 AM  Result Value Ref Range   Sodium 130 (L) 135 - 145 mmol/L   Potassium 4.3 3.5 - 5.1 mmol/L   Chloride 100 (L) 101 - 111 mmol/L   CO2 25 22 - 32 mmol/L   Glucose, Bld 94 65 - 99 mg/dL    BUN 9 6 - 20 mg/dL   Creatinine, Ser 0.93 0.61 - 1.24 mg/dL   Calcium 8.5 (L) 8.9 - 10.3 mg/dL   Total Protein 7.3 6.5 - 8.1 g/dL   Albumin 4.1 3.5 - 5.0 g/dL   AST 33 15 - 41 U/L   ALT 32 17 - 63 U/L   Alkaline Phosphatase 91 38 - 126 U/L   Total Bilirubin 1.6 (H) 0.3 - 1.2 mg/dL   GFR calc non Af Amer >60 >60 mL/min   GFR calc Af Amer >60 >60 mL/min    Comment: (NOTE) The eGFR has been calculated using the CKD EPI equation. This calculation has not been validated in all clinical situations. eGFR's persistently <60 mL/min signify possible Chronic Kidney Disease.    Anion gap 5 5 - 15    Current Facility-Administered Medications  Medication Dose Route Frequency Provider Last Rate Last Dose  . Ampicillin-Sulbactam (UNASYN) 3 g in sodium chloride 0.9 % 100 mL IVPB  3 g Intravenous 4 times per day Rise Patience, MD   3 g at 08/02/15 [id number]  . enoxaparin (LOVENOX) injection 40 mg  40 mg Subcutaneous Q24H Rise Patience, MD   40 mg at 08/02/15 1043  . folic acid (FOLVITE) tablet 1 mg  1 mg Oral Daily Rise Patience, MD   1 mg at 08/02/15 1044  . LORazepam (ATIVAN) injection 0-4 mg  0-4 mg Intravenous Q6H Rise Patience, MD   0 mg at 08/02/15 0834   Followed by  . [START ON 08/04/2015] LORazepam (ATIVAN) injection 0-4 mg  0-4 mg Intravenous Q12H Rise Patience, MD      . LORazepam (ATIVAN) tablet 1 mg  1 mg Oral Q6H PRN Rise Patience, MD       Or  . LORazepam (ATIVAN) injection 1 mg  1 mg Intravenous Q6H PRN Rise Patience, MD      . multivitamin with minerals tablet 1 tablet  1 tablet Oral Daily Rise Patience, MD   1 tablet at 08/02/15 1044  . senna-docusate (Senokot-S) tablet 1 tablet  1 tablet Oral QHS PRN Rise Patience, MD      . thiamine (VITAMIN B-1) tablet 100 mg  100 mg Oral Daily Rise Patience, MD   100 mg at 08/02/15 1048   Or  . thiamine (B-1) injection 100 mg  100 mg Intravenous Daily Rise Patience, MD         Musculoskeletal: Strength & Muscle Tone:  decreased Gait & Station: unsteady Patient leans: N/A  Psychiatric Specialty Exam: ROS depression, anxiety, paranoia, suicidal thoughts and substance abuse but denied nausea, vomiting, chest pain and shortness of breath No Fever-chills, No Headache, No changes with Vision or hearing, reports vertigo No problems swallowing food or Liquids, No Chest pain, Cough or Shortness of Breath, No Abdominal pain, No Nausea or Vommitting, Bowel movements are regular, No Blood in stool or Urine, No dysuria, No new skin rashes or bruises, No new joints pains-aches,  No new weakness, tingling, numbness in any extremity, No recent weight gain or loss, No polyuria, polydypsia or polyphagia,   A full 10 point Review of Systems was done, except as stated above, all other Review of Systems were negative.  Blood pressure 126/68, pulse 95, temperature 98.9 F (37.2 C), temperature source Oral, resp. rate 18, height '5\' 11"'  (1.803 m), weight 76.068 kg (167 lb 11.2 oz), SpO2 95 %.Body mass index is 23.4 kg/(m^2).  General Appearance: Guarded  Eye Contact::  Fair  Speech:  Clear and Coherent and Slow  Volume:  Decreased  Mood:  Anxious and Depressed  Affect:  Constricted and Depressed  Thought Process:  Coherent and Goal Directed  Orientation:  Full (Time, Place, and Person)  Thought Content:  Paranoid Ideation and Rumination  Suicidal Thoughts:  Yes.  with intent/plan  Homicidal Thoughts:  No  Memory:  Immediate;   Fair Recent;   Fair  Judgement:  Impaired  Insight:  Fair  Psychomotor Activity:  Psychomotor Retardation  Concentration:  Fair  Recall:  [place] of Knowledge:Good  Language: Good  Akathisia:  Negative  Handed:  Right  AIMS (if indicated):     Assets:  Communication Skills Desire for Improvement Financial Resources/Insurance Housing Leisure Time Resilience Social Support Transportation  ADL's:  Intact  Cognition: WNL  Sleep:       Treatment Plan Summary: Patient presented with increased symptoms of depression, anxiety and posttraumatic stress disorder, paranoia and also increased use of alcohol and cocaine. Patient reportedly tried to end his life by cocaine intoxication/overdose. Patient has been noncompliant with psychiatric medication management. Patient cannot contract for safety Daily contact with patient to assess and evaluate symptoms and progress in treatment and Medication management  Disposition: Safety concerns: Chief Technology Officer Monitor for Ativan detox protocol for alcohol Recommend psychiatric Inpatient admission when medically cleared. Supportive therapy provided about ongoing stressors. Appreciate psychiatric consultation and follow up as clinically required Please contact [phone] or [phone] if needs further assistance  Geremy Rister,JANARDHAHA R. 08/02/2015 1:18 PM

## 2015-08-02 NOTE — ED Notes (Signed)
Pt given urinal and made aware a specimen is needed.

## 2015-08-02 NOTE — ED Notes (Signed)
Pt states he did 2 gms of cocaine about 2 hours ago in an attempt to kill himself  Pt states he also has drank a case of beer today  Pt states he blacked out and when he came to he was diaphoretic  Pt states he is having some chest pain and the left side of his body keeps going numb

## 2015-08-02 NOTE — ED Notes (Signed)
Patient transported to X-ray 

## 2015-08-02 NOTE — ED Provider Notes (Signed)
CSN: [id number]     Arrival date & time 08/02/15  0126 History  By signing my name below, I, Tanda Rockers, attest that this documentation has been prepared under the direction and in the presence of Deb Loudin, MD. Electronically Signed: Tanda Rockers, ED Scribe. 08/02/2015. 1:53 AM.    Chief Complaint  Patient presents with  . Drug Overdose   Patient is a 34 y.o. male presenting with Overdose. The history is provided by the patient. No language interpreter was used.  Drug Overdose This is a new problem. The current episode started more than 2 days ago (worsening over the last 3-5 hours). The problem occurs constantly. The problem has not changed since onset.Pertinent negatives include no abdominal pain. Nothing aggravates the symptoms. Nothing relieves the symptoms. He has tried nothing for the symptoms. The treatment provided no relief.     HPI Comments: Curtis Blankenship is a 34 y.o. male who presents to the Emergency Department complaining of drug overdose that occurred earlier today. Pt states he injected 2 grams of cocaine approximately 3-4 hours ago. He states that he also drank a 24 pack of beer today, starting at lunchtime. Pt admits to wanting to commit suicide and states his plan was to overdose on the cocaine. Pt had visual changes, and left sided numbness awhile after injecting the cocaine. He states he panicked and took 1-2 Ibuprofen to try to "thin out his blood." Pt has been previously hospitalized for suicidal ideation. Denies HI or hallucinations. Pt usually drinks about a 24 pack of beer per day.   Past Medical History  Diagnosis Date  . Bipolar disorder ([facility])   . PTSD (post-traumatic stress disorder)   . Depression    Past Surgical History  Procedure Laterality Date  . Appendectomy     Family History  Problem Relation Age of Onset  . Hypertension Mother   . Alcoholism Other    Social History  Substance Use Topics  . Smoking status: Current Every Day Smoker --  1.50 packs/day for 10 years    Types: Cigarettes  . Smokeless tobacco: Former Neurosurgeon    Types: Snuff  . Alcohol Use: 97.2 oz/week    150 Cans of beer, 12 Shots of liquor per week     Comment: he drinks every day as much as he can    Review of Systems  Constitutional: Negative for fever.  Respiratory: Negative for wheezing.   Cardiovascular: Negative for palpitations and leg swelling.  Gastrointestinal: Negative for abdominal pain.  Neurological: Positive for numbness.  Psychiatric/Behavioral: Positive for suicidal ideas. Negative for hallucinations.  All other systems reviewed and are negative.  Allergies  Review of patient's allergies indicates no known allergies.  Home Medications   Prior to Admission medications   Medication Sig Start Date End Date Taking? Authorizing Provider  ibuprofen (ADVIL,MOTRIN) 200 MG tablet Take 200-400 mg by mouth every 6 (six) hours as needed (for pain.).   Yes Historical Provider, MD   Triage Vitals: BP 132/83 mmHg  Pulse 118  Temp(Src) 98.4 F (36.9 C) (Oral)  Resp 17  SpO2 97%   Physical Exam  Constitutional: He is oriented to person, place, and time. He appears well-developed and well-nourished. No distress.  HENT:  Head: Normocephalic and atraumatic.  Mouth/Throat: Oropharynx is clear and moist and mucous membranes are normal. No oropharyngeal exudate.  Eyes: Conjunctivae and EOM are normal. Pupils are equal, round, and reactive to light.  Neck: Normal range of motion. Neck supple. No tracheal deviation present.  Trachea is midline  Cardiovascular: Normal rate and regular rhythm.   Pulmonary/Chest: Effort normal and breath sounds normal. No respiratory distress. He has no wheezes. He has no rhonchi. He has no rales. He exhibits no tenderness.  Abdominal: Soft. Bowel sounds are normal. There is no tenderness. There is no rebound and no guarding.  Musculoskeletal: Normal range of motion.  Neurological: He is alert and oriented to person,  place, and time. He has normal reflexes.  No hyperreflexia No clonus  Skin: Skin is warm and dry.  No visible injection marks  Psychiatric: He has a normal mood and affect. His behavior is normal.  Nursing note and vitals reviewed.   ED Course  Procedures (including critical care time)  DIAGNOSTIC STUDIES: Oxygen Saturation is 97% on RA, normal by my interpretation.    COORDINATION OF CARE: 1:52 AM-Discussed treatment plan which includes CBC, Chem 8, rapid drug screen, CMP, EtOH, salicylate level, acetaminophen, CBC, with pt at bedside and pt agreed to plan.   Labs Review Labs Reviewed  COMPREHENSIVE METABOLIC PANEL  ETHANOL  SALICYLATE LEVEL  ACETAMINOPHEN LEVEL  CBC  URINE RAPID DRUG SCREEN, HOSP PERFORMED    Imaging Review No results found. I have personally reviewed and evaluated these images and lab results as part of my medical decision-making.   EKG Interpretation None      EKG Interpretation  Date/Time:  Tuesday August 02 2015 01:49:54 EDT Ventricular Rate:  102 PR Interval:  123 QRS Duration: 101 QT Interval:  349 QTC Calculation: 455 R Axis:   79 Text Interpretation:  Sinus tachycardia early repol pattern Confirmed by [facility]  MD, Casia Corti ([id number]) on 08/02/2015 1:52:27 AM       MDM   Final diagnoses:  None   Results for orders placed or performed during the hospital encounter of 08/02/15  Comprehensive metabolic panel  Result Value Ref Range   Sodium 129 (L) 135 - 145 mmol/L   Potassium 3.8 3.5 - 5.1 mmol/L   Chloride 93 (L) 101 - 111 mmol/L   CO2 22 22 - 32 mmol/L   Glucose, Bld 115 (H) 65 - 99 mg/dL   BUN 10 6 - 20 mg/dL   Creatinine, Ser 9.14 0.61 - 1.24 mg/dL   Calcium 9.1 8.9 - 78.2 mg/dL   Total Protein 8.2 (H) 6.5 - 8.1 g/dL   Albumin 4.6 3.5 - 5.0 g/dL   AST 39 15 - 41 U/L   ALT 34 17 - 63 U/L   Alkaline Phosphatase 105 38 - 126 U/L   Total Bilirubin 0.8 0.3 - 1.2 mg/dL   GFR calc non Af Amer >60 >60 mL/min   GFR calc Af  Amer >60 >60 mL/min   Anion gap 14 5 - 15  Ethanol (ETOH)  Result Value Ref Range   Alcohol, Ethyl (B) 23 (H) <5 mg/dL  Salicylate level  Result Value Ref Range   Salicylate Lvl <4.0 2.8 - 30.0 mg/dL  Acetaminophen level  Result Value Ref Range   Acetaminophen (Tylenol), Serum <10 (L) 10 - 30 ug/mL  CBC with Differential/Platelet  Result Value Ref Range   WBC 7.6 4.0 - 10.5 K/uL   RBC 4.21 (L) 4.22 - 5.81 MIL/uL   Hemoglobin 13.4 13.0 - 17.0 g/dL   HCT 95.6 (L) 21.3 - 08.6 %   MCV 90.0 78.0 - 100.0 fL   MCH 31.8 26.0 - 34.0 pg   MCHC 35.4 30.0 - 36.0 g/dL   RDW 57.8 46.9 - 62.9 %  Platelets 317 150 - 400 K/uL   Neutrophils Relative % 71 %   Neutro Abs 5.3 1.7 - 7.7 K/uL   Lymphocytes Relative 18 %   Lymphs Abs 1.4 0.7 - 4.0 K/uL   Monocytes Relative 10 %   Monocytes Absolute 0.8 0.1 - 1.0 K/uL   Eosinophils Relative 1 %   Eosinophils Absolute 0.1 0.0 - 0.7 K/uL   Basophils Relative 0 %   Basophils Absolute 0.0 0.0 - 0.1 K/uL  I-stat chem 8, ed  Result Value Ref Range   Sodium 129 (L) 135 - 145 mmol/L   Potassium 3.7 3.5 - 5.1 mmol/L   Chloride 93 (L) 101 - 111 mmol/L   BUN 10 6 - 20 mg/dL   Creatinine, Ser 1.61 0.61 - 1.24 mg/dL   Glucose, Bld 096 (H) 65 - 99 mg/dL   Calcium, Ion 0.45 4.09 - 1.23 mmol/L   TCO2 23 0 - 100 mmol/L   Hemoglobin 14.6 13.0 - 17.0 g/dL   HCT 81.1 91.4 - 78.2 %  I-stat troponin, ED  Result Value Ref Range   Troponin i, poc 0.00 0.00 - 0.08 ng/mL   Comment 3           Dg Chest 2 View  08/02/2015   CLINICAL DATA:  Overdose.  Vomiting.  Dyspnea.  EXAM: CHEST  2 VIEW  COMPARISON:  05/03/2014  FINDINGS: Mild linear left base opacities may be atelectatic. Early infectious infiltrate or aspiration cannot be excluded. The right lung is clear. There is no pleural effusion. Pulmonary vasculature is normal. Hilar and mediastinal contours are unremarkable.  IMPRESSION: Mild linear left base opacities. Early infectious infiltrate or aspiration cannot be  excluded.   Electronically Signed   By: Ellery Plunk M.D.   On: 08/02/2015 02:17    Medications  levofloxacin (LEVAQUIN) IVPB 500 mg (500 mg Intravenous New Bag/Given 08/02/15 0249)  sodium chloride 0.9 % bolus 1,000 mL (0 mLs Intravenous Stopped 08/02/15 0249)  pantoprazole (PROTONIX) injection 40 mg (40 mg Intravenous Given 08/02/15 0249)    I, Tywana Robotham-RASCH,Tyshell Ramberg K, personally performed the services described in this documentation. All medical record entries made by the scribe were at my direction and in my presence.  I have reviewed the chart and discharge instructions and agree that the record reflects my personal performance and is accurate and complete. Alphonsine Minium-RASCH,Chenay Nesmith K.  08/02/2015. 3:22 AM.       Crystalmarie Yasin, MD 08/02/15 [phone]

## 2015-08-02 NOTE — Progress Notes (Signed)
34 year old gentleman admitted for suicidal ideation, substance abuse with alcohol and cocaine use.   Suicidal ideation: Psychiatry consulted and recommendations to transfer to [facility] when medically stable.   Substance abuse with alcohol and cocaine abuse: - CIWA initiated.  - troponins negative.  - denies any chest pain.    Blurry vision: - CT and MRI brain negative for acute stroke or pathology.    Possible aspiration Pneumonia: - started on unasyn.    Hyponatremia: repeat in am.   Kathlen Mody, MD [phone]

## 2015-08-03 ENCOUNTER — Inpatient Hospital Stay (HOSPITAL_COMMUNITY)
Admission: AD | Admit: 2015-08-03 | Discharge: 2015-08-29 | DRG: 885 | Disposition: A | Discharge status: Home / Self Care | Payer: Medicare Other | Source: Intra-hospital | Attending: Psychiatry | Admitting: Psychiatry

## 2015-08-03 DIAGNOSIS — F1420 Cocaine dependence, uncomplicated: Secondary | ICD-10-CM | POA: Diagnosis present

## 2015-08-03 DIAGNOSIS — F1010 Alcohol abuse, uncomplicated: Secondary | ICD-10-CM | POA: Diagnosis not present

## 2015-08-03 DIAGNOSIS — Z8249 Family history of ischemic heart disease and other diseases of the circulatory system: Secondary | ICD-10-CM | POA: Diagnosis not present

## 2015-08-03 DIAGNOSIS — F4312 Post-traumatic stress disorder, chronic: Secondary | ICD-10-CM | POA: Diagnosis present

## 2015-08-03 DIAGNOSIS — F1020 Alcohol dependence, uncomplicated: Secondary | ICD-10-CM | POA: Diagnosis not present

## 2015-08-03 DIAGNOSIS — R45851 Suicidal ideations: Secondary | ICD-10-CM | POA: Diagnosis not present

## 2015-08-03 DIAGNOSIS — E871 Hypo-osmolality and hyponatremia: Secondary | ICD-10-CM | POA: Diagnosis not present

## 2015-08-03 DIAGNOSIS — F410 Panic disorder [episodic paroxysmal anxiety] without agoraphobia: Secondary | ICD-10-CM | POA: Diagnosis present

## 2015-08-03 DIAGNOSIS — F10230 Alcohol dependence with withdrawal, uncomplicated: Secondary | ICD-10-CM | POA: Insufficient documentation

## 2015-08-03 DIAGNOSIS — F17210 Nicotine dependence, cigarettes, uncomplicated: Secondary | ICD-10-CM | POA: Diagnosis present

## 2015-08-03 DIAGNOSIS — J690 Pneumonitis due to inhalation of food and vomit: Secondary | ICD-10-CM

## 2015-08-03 DIAGNOSIS — F1410 Cocaine abuse, uncomplicated: Secondary | ICD-10-CM | POA: Diagnosis not present

## 2015-08-03 DIAGNOSIS — F4310 Post-traumatic stress disorder, unspecified: Secondary | ICD-10-CM | POA: Diagnosis present

## 2015-08-03 DIAGNOSIS — H538 Other visual disturbances: Secondary | ICD-10-CM

## 2015-08-03 DIAGNOSIS — F332 Major depressive disorder, recurrent severe without psychotic features: Principal | ICD-10-CM | POA: Diagnosis present

## 2015-08-03 DIAGNOSIS — F411 Generalized anxiety disorder: Secondary | ICD-10-CM | POA: Diagnosis present

## 2015-08-03 LAB — BASIC METABOLIC PANEL
ANION GAP: 7 (ref 5–15)
BUN: 12 mg/dL (ref 6–20)
CALCIUM: 8.8 mg/dL — AB (ref 8.9–10.3)
CO2: 27 mmol/L (ref 22–32)
CREATININE: 1.15 mg/dL (ref 0.61–1.24)
Chloride: 102 mmol/L (ref 101–111)
Glucose, Bld: 104 mg/dL — ABNORMAL HIGH (ref 65–99)
Potassium: 3.9 mmol/L (ref 3.5–5.1)
SODIUM: 136 mmol/L (ref 135–145)

## 2015-08-03 LAB — CBC
HEMATOCRIT: 38.3 % — AB (ref 39.0–52.0)
Hemoglobin: 12.9 g/dL — ABNORMAL LOW (ref 13.0–17.0)
MCH: 31.5 pg (ref 26.0–34.0)
MCHC: 33.7 g/dL (ref 30.0–36.0)
MCV: 93.6 fL (ref 78.0–100.0)
PLATELETS: 258 10*3/uL (ref 150–400)
RBC: 4.09 MIL/uL — ABNORMAL LOW (ref 4.22–5.81)
RDW: 13.2 % (ref 11.5–15.5)
WBC: 3.8 10*3/uL — AB (ref 4.0–10.5)

## 2015-08-03 LAB — LEGIONELLA PNEUMOPHILA SEROGP 1 UR AG: L. PNEUMOPHILA SEROGP 1 UR AG: NEGATIVE

## 2015-08-03 MED ORDER — LOPERAMIDE HCL 2 MG PO CAPS: 2.0000 mg | ORAL_CAPSULE | ORAL | Status: AC | PRN

## 2015-08-03 MED ORDER — CHLORDIAZEPOXIDE HCL 25 MG PO CAPS: 25.0000 mg | ORAL_CAPSULE | Freq: Every day | ORAL | Status: DC

## 2015-08-03 MED ORDER — FOLIC ACID 1 MG PO TABS: 1.0000 mg | ORAL_TABLET | Freq: Every day | ORAL | Status: DC

## 2015-08-03 MED ORDER — VITAMIN B-1 100 MG PO TABS
100.0000 mg | ORAL_TABLET | Freq: Every day | ORAL | Status: DC
  Administered 2015-08-04 – 2015-08-29 (×26): 100 mg via ORAL
  Filled 2015-08-03 (×30): qty 1

## 2015-08-03 MED ORDER — CHLORDIAZEPOXIDE HCL 25 MG PO CAPS: 25.0000 mg | ORAL_CAPSULE | ORAL | Status: DC

## 2015-08-03 MED ORDER — MAGNESIUM HYDROXIDE 400 MG/5ML PO SUSP: 30.0000 mL | Freq: Every day | ORAL | Status: DC | PRN

## 2015-08-03 MED ORDER — THIAMINE HCL 100 MG PO TABS: 100.0000 mg | ORAL_TABLET | Freq: Every day | ORAL | Status: DC

## 2015-08-03 MED ORDER — LORAZEPAM 1 MG PO TABS: 1.0000 mg | ORAL_TABLET | Freq: Four times a day (QID) | ORAL | Status: DC | PRN

## 2015-08-03 MED ORDER — ADULT MULTIVITAMIN W/MINERALS CH
1.0000 | ORAL_TABLET | Freq: Every day | ORAL | Status: DC
  Administered 2015-08-04 – 2015-08-29 (×26): 1 via ORAL
  Filled 2015-08-03 (×31): qty 1

## 2015-08-03 MED ORDER — TRAZODONE HCL 50 MG PO TABS
50.0000 mg | ORAL_TABLET | Freq: Every evening | ORAL | Status: DC | PRN
  Administered 2015-08-04 – 2015-08-05 (×2): 50 mg via ORAL
  Filled 2015-08-03 (×9): qty 1

## 2015-08-03 MED ORDER — AMOXICILLIN-POT CLAVULANATE 875-125 MG PO TABS: 1.0000 | ORAL_TABLET | Freq: Two times a day (BID) | ORAL | Status: DC

## 2015-08-03 MED ORDER — ONDANSETRON 4 MG PO TBDP: 4.0000 mg | ORAL_TABLET | Freq: Four times a day (QID) | ORAL | Status: AC | PRN

## 2015-08-03 MED ORDER — THIAMINE HCL 100 MG/ML IJ SOLN: 100.0000 mg | Freq: Once | INTRAMUSCULAR | Status: DC

## 2015-08-03 MED ORDER — SENNOSIDES-DOCUSATE SODIUM 8.6-50 MG PO TABS: 1.0000 | ORAL_TABLET | Freq: Every evening | ORAL | Status: DC | PRN

## 2015-08-03 MED ORDER — AMOXICILLIN-POT CLAVULANATE 875-125 MG PO TABS
1.0000 | ORAL_TABLET | Freq: Two times a day (BID) | ORAL | Status: AC
  Administered 2015-08-03 – 2015-08-05 (×5): 1 via ORAL
  Filled 2015-08-03 (×7): qty 1

## 2015-08-03 MED ORDER — HYDROXYZINE HCL 25 MG PO TABS
25.0000 mg | ORAL_TABLET | Freq: Four times a day (QID) | ORAL | Status: AC | PRN
  Administered 2015-08-03 – 2015-08-06 (×2): 25 mg via ORAL
  Filled 2015-08-03 (×2): qty 1

## 2015-08-03 MED ORDER — CHLORDIAZEPOXIDE HCL 25 MG PO CAPS: 25.0000 mg | ORAL_CAPSULE | Freq: Four times a day (QID) | ORAL | Status: AC | PRN

## 2015-08-03 MED ORDER — CHLORDIAZEPOXIDE HCL 25 MG PO CAPS: 25.0000 mg | ORAL_CAPSULE | Freq: Three times a day (TID) | ORAL | Status: DC

## 2015-08-03 MED ORDER — CHLORDIAZEPOXIDE HCL 25 MG PO CAPS
25.0000 mg | ORAL_CAPSULE | Freq: Four times a day (QID) | ORAL | Status: DC
  Administered 2015-08-03 – 2015-08-04 (×2): 25 mg via ORAL
  Filled 2015-08-03 (×2): qty 1

## 2015-08-03 MED ORDER — ALUM & MAG HYDROXIDE-SIMETH 200-200-20 MG/5ML PO SUSP: 30.0000 mL | ORAL | Status: DC | PRN

## 2015-08-03 MED ORDER — AMOXICILLIN-POT CLAVULANATE 875-125 MG PO TABS
1.0000 | ORAL_TABLET | Freq: Two times a day (BID) | ORAL | Status: DC
  Administered 2015-08-03: 1 via ORAL
  Filled 2015-08-03: qty 1

## 2015-08-03 NOTE — Discharge Summary (Addendum)
Physician Discharge Summary  Curtis Blankenship WUJ:[id number] DOB: 17-Jan-1981 DOA: 08/02/2015  PCP: PROVIDER NOT IN SYSTEM  Admit date: 08/02/2015 Discharge date: 08/03/2015  Time spent: 25* minutes  Recommendations for Outpatient Follow-up:  1. *Follow up PCP in 2 weeks  Discharge Diagnoses:  Principal Problem:   Suicidal ideation Active Problems:   Cocaine abuse   Hyponatremia   Alcohol abuse   Aspiration pneumonia (HCC)   Syncope   Blurred vision   Discharge Condition: Stable  Diet recommendation: Regular diet  Filed Weights   08/02/15 0448  Weight: 76.068 kg (167 lb 11.2 oz)    History of present illness:  34 y.o. male with history of alcohol and cocaine abuse presented to the ER because of suicidal ideation. Patient states he has been drinking alcohol everyday and last evening had taken extra dose of cocaine with intention of suicide. Patient states that he passed out following the last dose of cocaine. When he woke up he started developing chest pressure which lasted for 1 hour and resolved. Patient has been having some left-sided blurred vision since then. Patient hired a cab and came to the ER. Prior to which patient also had one episode of nausea vomiting. In the ER patient's labs revealed positive cocaine with mild hyponatremia and chest x-ray showing possible infiltrate. Patient was placed on suicide precaution and admitted for further management.  Hospital Course:  1. Depression with suicidal ideation- continue one-to-one sitter, psych has seen the patient and recommend inpatient psych treatment when patient medically stable. 2. ? Pneumonia- chest x-ray shows pneumonitis, patient she started on ampicillin yesterday. Urinary Strep pneumo antigen is negative. Urinary Legionella antigen is pending. Will discharge the patient on Augmentin 1 tablet by mouth twice a day for 6 more days to complete 7 days of therapy. 3. Hyponatremia- resolved, today sodium 136 4. Alcohol  abuse- patient started on CIWA protocol. No signs and symptoms of alcohol withdrawal at this time. 5. Blurred vision- could be due to the cocaine use, CT head and MRI brain negative for stroke or any other neurological abnormality. 6. Syncope with chest pain- resolved, likely from cocaine overdose. Cardiac enzymes 3 were negative in the hospital. Patient does not have chest pain at this time.  Procedures:  None  Consultations:  Psychiatry  Discharge Exam: Filed Vitals:   08/03/15 1300  BP: 117/86  Pulse: 97  Temp: 97.9 F (36.6 C)  Resp: 18    General: Appears in no acute distress Cardiovascular: S1S2 RRR Respiratory: Clear bilaterally  Discharge Instructions   Discharge Instructions    Diet - low sodium heart healthy    Complete by:  As directed      Increase activity slowly    Complete by:  As directed           Current Discharge Medication List    START taking these medications   Details  amoxicillin-clavulanate (AUGMENTIN) 875-125 MG tablet Take 1 tablet by mouth every 12 (twelve) hours. Qty: 12 tablet, Refills: 0    folic acid (FOLVITE) 1 MG tablet Take 1 tablet (1 mg total) by mouth daily.    LORazepam (ATIVAN) 1 MG tablet Take 1 tablet (1 mg total) by mouth every 6 (six) hours as needed (CIWA-AR > 8  -OR-  withdrawal symptoms:  anxiety, agitation, insomnia, diaphoresis, nausea, vomiting, tremors, tachycardia, or hypertension.). Qty: 30 tablet, Refills: 0    senna-docusate (SENOKOT-S) 8.6-50 MG tablet Take 1 tablet by mouth at bedtime as needed for mild constipation.  thiamine 100 MG tablet Take 1 tablet (100 mg total) by mouth daily.      CONTINUE these medications which have NOT CHANGED   Details  ibuprofen (ADVIL,MOTRIN) 200 MG tablet Take 200-400 mg by mouth every 6 (six) hours as needed (for pain.).       No Known Allergies    The results of significant diagnostics from this hospitalization (including imaging, microbiology, ancillary and  laboratory) are listed below for reference.    Significant Diagnostic Studies: Dg Chest 2 View  08/02/2015   CLINICAL DATA:  Overdose.  Vomiting.  Dyspnea.  EXAM: CHEST  2 VIEW  COMPARISON:  05/03/2014  FINDINGS: Mild linear left base opacities may be atelectatic. Early infectious infiltrate or aspiration cannot be excluded. The right lung is clear. There is no pleural effusion. Pulmonary vasculature is normal. Hilar and mediastinal contours are unremarkable.  IMPRESSION: Mild linear left base opacities. Early infectious infiltrate or aspiration cannot be excluded.   Electronically Signed   By: Ellery Plunk M.D.   On: 08/02/2015 02:17   Ct Head Wo Contrast  08/02/2015   CLINICAL DATA:  Left-sided blurred vision. Suicidal ideation. Drug abuse. Bipolar disorder.  EXAM: CT HEAD WITHOUT CONTRAST  TECHNIQUE: Contiguous axial images were obtained from the base of the skull through the vertex without intravenous contrast.  COMPARISON:  09/19/2013  FINDINGS: No evidence of intracranial hemorrhage, brain edema, or other signs of acute infarction. No evidence of intracranial mass lesion or mass effect.  No abnormal extraaxial fluid collections identified. Ventricles are normal in size. No skull abnormality identified.  IMPRESSION: Negative noncontrast head CT.   Electronically Signed   By: Myles Rosenthal M.D.   On: 08/02/2015 07:43   Mr Brain Wo Contrast  08/02/2015   CLINICAL DATA:  34 year old male with left side blurred vision yesterday. Initial encounter.  EXAM: MRI HEAD WITHOUT CONTRAST  TECHNIQUE: Multiplanar, multiecho pulse sequences of the brain and surrounding structures were obtained without intravenous contrast.  COMPARISON:  Head CT without contrast 0710 hr today. Brain MRI 09/20/2013.  FINDINGS: Major intracranial vascular flow voids are stable and within normal limits. Stable and normal cerebral volume. No restricted diffusion to suggest acute infarction. No midline shift, mass effect, evidence of  mass lesion, ventriculomegaly, extra-axial collection or acute intracranial hemorrhage. Cervicomedullary junction and pituitary are within normal limits. Negative visualized cervical spine. Wallace Cullens and white matter signal is within normal limits for age throughout the brain. No chronic cerebral blood products identified.  Visible internal auditory structures appear normal. Mastoids are clear. Trace paranasal sinus mucosal thickening. Negative scalp soft tissues. Normal bone marrow signal. Orbits soft tissues appear stable and normal. Optic chiasm appears stable and normal.  IMPRESSION: Stable and negative noncontrast MRI appearance of the brain.   Electronically Signed   By: Odessa Fleming M.D.   On: 08/02/2015 18:10    Microbiology: No results found for this or any previous visit (from the past 240 hour(s)).   Labs: Basic Metabolic Panel:  Recent Labs Lab 08/02/15 0156 08/02/15 0208 08/02/15 0701 08/03/15 0516  NA 129* 129* 130* 136  K 3.8 3.7 4.3 3.9  CL 93* 93* 100* 102  CO2 22  --  25 27  GLUCOSE 115* 116* 94 104*  BUN CREATININE 0.87 1.00 0.93 1.15  CALCIUM 9.1  --  8.5* 8.8*   Liver Function Tests:  Recent Labs Lab 08/02/15 0156 08/02/15 0701  AST 39 33  ALT 34 32  ALKPHOS  105 91  BILITOT 0.8 1.6*  PROT 8.2* 7.3  ALBUMIN 4.6 4.1   No results for input(s): LIPASE, AMYLASE in the last 168 hours. No results for input(s): AMMONIA in the last 168 hours. CBC:  Recent Labs Lab 08/02/15 0156 08/02/15 0208 08/02/15 0701 08/03/15 0516  WBC 7.6  --  6.8 3.8*  NEUTROABS 5.3  --   --   --   HGB 13.4 14.6 13.0 12.9*  HCT 37.9* 43.0 37.7* 38.3*  MCV 90.0  --  90.6 93.6  PLT 317  --  294 258   Cardiac Enzymes:  Recent Labs Lab 08/02/15 0701 08/02/15 1349 08/02/15 2021  TROPONINI <0.03 <0.03 <0.03   BNP: BNP (last 3 results) No results for input(s): BNP in the last 8760 hours.  ProBNP (last 3 results) No results for input(s): PROBNP in the last 8760  hours.  CBG: No results for input(s): GLUCAP in the last 168 hours.     SignedMauro Blankenship S  [facility] 08/03/2015, 2:42 PM

## 2015-08-03 NOTE — Progress Notes (Addendum)
D) Pt. Is 16 y.ol male admitted to [facility] for "overdosing on cocaine". Pt. Appears very disheveled and appeared angry.  Pt. Presented with irritabililty and unwillingness to answer questions.  When attempts were made to obtain information, pt. Stated "I was busy overdosing on cocaine, it's not like I've been taking notes on what I've been doing."   Pt. Had several bruises/needle marks on left arm and stated they were there "because I missed". Pt. Reports history of appendectomy. Pt. Denied pain.  Pt. States he had "not taken any prescriptions medications in awhile".  Pt. Stated the only thing he was interested in doing was taking a shower.  A) MD notified in attempt to get admitting orders.  Pt.'s VS were obtained and skin assessment completed.  Pt. Offered food and stated initially he wasn't interested, but stated he might eat something later. Pt. Signed consents for treatment and adult handbook was reviewed.  R) pt. Came onto the unit, took a shower, and immediately went to sleep.

## 2015-08-03 NOTE — Progress Notes (Signed)
Adult Psychoeducational Group Note  Date:  08/03/2015 Time:  8:52 PM  Group Topic/Focus:  Wrap-Up Group:   The focus of this group is to help patients review their daily goal of treatment and discuss progress on daily workbooks.  Participation Level:  Did Not Attend  Additional Comments:  Pt has been in the bed sleeping since shift change  [name] 08/03/2015, 8:52 PM

## 2015-08-03 NOTE — Progress Notes (Signed)
Patient has been accepted to [facility], per [facility]. Patient bed number is 301-bed 1. RN number to call report is [phone]. Accepting MD is Dr. Dub Mikes. CSW will coordinate with RN regarding patient transportation time. CSW will then arrange for patient to be transported via [facility].   Fernande Boyden, [facility] Clinical Social Worker [place] [phone]

## 2015-08-03 NOTE — Progress Notes (Signed)
Report called [facility] and talked with Darl Pikes.

## 2015-08-03 NOTE — Clinical Social Work Psych Assess (Signed)
Clinical Social Work Librarian, academic  Clinical Social Worker:  Loleta Dicker, LCSW Date/Time:  08/03/2015, 4:55 PM Referred By:  Physician Date Referred:  08/02/15 Reason for Referral:  Behavioral Health Issues, Substance Abuse   Presenting Symptoms/Problems  Presenting Symptoms/Problems(in person's/family's own words):  Per patient, "I would like for you to look in the chart, but I am here for overdosing on cocaine".    Abuse/Neglect/Trauma History  Abuse/Neglect/Trauma History:  Denies History Abuse/Neglect/Trauma History Comments (indicate dates):  N/A   Psychiatric History  Psychiatric History:  Inpatient/Hospitalization (Per patient, he has been hospitalized inpatient at [facility] in the past) Psychiatric Medication: Ativan   Current Mental Health Hospitalizations/Previous Mental Health History:  Per patient, he has been diagnosed in the past with PTSD, TBI, Depression and Anxiety.    Current Provider:  MD Armor at [facility] and Date:  [place], [place]  Current Medications:   Scheduled Meds: . amoxicillin-clavulanate  1 tablet Oral Q12H  . enoxaparin (LOVENOX) injection  40 mg Subcutaneous Q24H  . folic acid  1 mg Oral Daily  . LORazepam  0-4 mg Intravenous Q6H   Followed by  . [START ON 08/04/2015] LORazepam  0-4 mg Intravenous Q12H  . multivitamin with minerals  1 tablet Oral Daily  . thiamine  100 mg Oral Daily   Or  . thiamine  100 mg Intravenous Daily   Continuous Infusions:  PRN Meds:.LORazepam **OR** LORazepam, senna-docusate    Previous Inpatient Admission/Date/Reason:  Per patient, he has been hospitalized inpatient at the [facility] in the past.   Emotional Health/Current Symptoms  Suicide/Self Harm: Suicide Attempt in the Past (date/description), Self-Injurious Behaviors (ex. picking & pinching or carving on skin, chronic runaway, poor judgement) (Per patient, attempted to overdose in the past  due to depression and  anxiety) Suicide Attempt in Past (date/description):  Patient reports only one other suicide attempt in the past.   Other Harmful Behavior (ex. homicidal ideation) (describe):  N/A   Psychotic/Dissociative Symptoms  Psychotic/Dissociative Symptoms: None Reported Other Psychotic/Dissociative Symptoms:  N/A   Attention/Behavioral Symptoms  Attention/Behavioral Symptoms: Inattentive, Restless Other Attention/Behavioral Symptoms:  N/A   Cognitive Impairment  Cognitive Impairment:  Within Normal Limits, Traumatic Brain Injury (Per patient, he reports having a traumatic brain injury) Other Cognitive Impairment:  N/A   Mood and Adjustment  Mood and Adjustment:  Flat, Mood Congruent   Stress, Anxiety, Trauma, Any Recent Loss/Stressor  Stress, Anxiety, Trauma, Any Recent Loss/Stressor: None Reported Anxiety (frequency):  N/A  Phobia (specify):  N/A  Compulsive Behavior (specify):  N/A  Obsessive Behavior (specify):  N/A  Other Stress, Anxiety, Trauma, Any Recent Loss/Stressor:  N/A   Substance Abuse/Use  Substance Abuse/Use: Current Substance Use, History of Substance Use SBIRT Completed (please refer for detailed history): Yes Self-reported Substance Use (last use and frequency):  N/A  Urinary Drug Screen Completed: Yes Alcohol Level: N/A   Environment/Housing/Living Arrangement  Environmental/Housing/Living Arrangement: With Biological Parent(s) Who is in the Home:  Mother and patient   Emergency Contact:   [place], [place] Mother [phone]      Financial  Financial: Medicare   Patient's Strengths and Goals  Patient's Strengths and Goals (patient's own words):  Patient refused to answer any further questions regarding his situation.    Clinical Social Worker's Interpretive Summary  Clinical Social Workers Interpretive Summary:  CSW received consult to speak with patient regarding overdose. CSW introduced self and acknowledged patient. Patient was  alert and oriented, however, appeared to be very  restless. Patient informed CSW of his reasoning for being admitted into the hospital. Patient is here due to overdose attempt. Patient currently denies any SI/HI/AVH. Patient reports drinking alcohol daily, and doing cocaine intravenously. Patient reports that he lives at home with his mother, and has been living there for the past two months. Patient reports prior hospitalizations at the [facility] due to an overdose attempt. Patient was unable to identify any strengths, however, was encouraged by CSW for seeking treatment in the past.   Psych MD has recommended inpatient psychiatric hospitalization. [facility] has provided the patient a bed on today. Patient will transfer via [facility] to room 301 bed 1. Accepting MD is [place]. RN has been provided number to report. Patient signed Voluntary Consent. CSW faxed out to [facility].    Disposition  Disposition: Inpatient Referral Made [facility] [place])

## 2015-08-03 NOTE — Progress Notes (Signed)
D: Curtis (goes by [name]) is slightly irritated, anxious and visibly tremulous tonight. He answers yes or no and does not elaborate much on the questions asked. Denies SI/HI/AVH at this time. Endorses drinking over a case of beer in one day and also states " I can drink a fifth of liquor in one sitting". He is requesting something for his "shakes". A: Encouragement and support given. R: Medication administered as ordered. Will continue to monitor for safety.

## 2015-08-03 NOTE — Progress Notes (Signed)
[facility] PROGRESS NOTE  Curtis Blankenship:[id number] DOB: 10-31-1980 DOA: 08/02/2015 PCP: PROVIDER NOT IN SYSTEM  Assessment/Plan: 1. Depression with suicidal ideation- continue one-to-one sitter, psych has seen the patient and recommend inpatient psych treatment when patient medically stable. 2. ? Pneumonia- chest x-ray shows pneumonitis, patient she started on ampicillin yesterday. Urinary Strep pneumo antigen is negative. Urinary Legionella antigen is pending. Will discharge the patient on Augmentin 1 tablet by mouth twice a day for 6 more days to complete 7 days of therapy. 3. Hyponatremia- resolved, today sodium 136 4. Alcohol abuse- patient started on CIWA protocol. No signs and symptoms of alcohol withdrawal at this time. 5. Blurred vision- could be due to the cocaine use, CT head and MRI brain negative for stroke or any other neurological abnormality.  Code Status: Full code Family Communication: No family at bedside Disposition Plan: Transfer to [facility] as patient is medically stable for discharge. He will require 6 more days of Augmentin, the order has been placed.   Consultants:  Psychiatry  Procedures:  None  Antibiotics:  Unasyn  Start Augmentin from 08/03/2015  HPI/Subjective: 34 y.o. male with history of alcohol and cocaine abuse presented to the ER because of suicidal ideation. Patient states he has been drinking alcohol everyday and last evening had taken extra dose of cocaine with intention of suicide. Patient states that he passed out following the last dose of cocaine. When he woke up he started developing chest pressure which lasted for 1 hour and resolved. Patient has been having some left-sided blurred vision since then. Patient hired a cab and came to the ER. Prior to which patient also had one episode of nausea vomiting. In the ER patient's labs revealed positive cocaine with mild hyponatremia and chest x-ray showing possible infiltrate. Patient was  placed on suicide precaution and admitted for further management. This morning patient feels fine, eating breakfast. He has been afebrile, white count is normal.  Objective: Filed Vitals:   08/03/15 0631  BP: 134/77  Pulse: 91  Temp: 98.8 F (37.1 C)  Resp: 20    Intake/Output Summary (Last 24 hours) at 08/03/15 1101 Last data filed at 08/02/15 1847  Gross per 24 hour  Intake    680 ml  Output    800 ml  Net   -120 ml   Filed Weights   08/02/15 0448  Weight: 76.068 kg (167 lb 11.2 oz)    Exam:   General:  Appears in no acute distress  Cardiovascular: S1-S2 is regular, no murmurs rubs or gallops  Respiratory: Clear to auscultation bilaterally, no wheezing or crackles auscultated  Abdomen: Soft, nontender, no organomegaly  Musculoskeletal: No edema of the lower extremities, cyanosis or clubbing noted   Data Reviewed: Basic Metabolic Panel:  Recent Labs Lab 08/02/15 0156 08/02/15 0208 08/02/15 0701 08/03/15 0516  NA 129* 129* 130* 136  K 3.8 3.7 4.3 3.9  CL 93* 93* 100* 102  CO2 22  --  25 27  GLUCOSE 115* 116* 94 104*  BUN CREATININE 0.87 1.00 0.93 1.15  CALCIUM 9.1  --  8.5* 8.8*   Liver Function Tests:  Recent Labs Lab 08/02/15 0156 08/02/15 0701  AST 39 33  ALT 34 32  ALKPHOS 105 91  BILITOT 0.8 1.6*  PROT 8.2* 7.3  ALBUMIN 4.6 4.1   No results for input(s): LIPASE, AMYLASE in the last 168 hours. No results for input(s): AMMONIA in the last 168 hours. CBC:  Recent Labs  Lab 08/02/15 0156 08/02/15 0208 08/02/15 0701 08/03/15 0516  WBC 7.6  --  6.8 3.8*  NEUTROABS 5.3  --   --   --   HGB 13.4 14.6 13.0 12.9*  HCT 37.9* 43.0 37.7* 38.3*  MCV 90.0  --  90.6 93.6  PLT 317  --  294 258   Cardiac Enzymes:  Recent Labs Lab 08/02/15 0701 08/02/15 1349 08/02/15 2021  TROPONINI <0.03 <0.03 <0.03   Studies: Dg Chest 2 View  08/02/2015   CLINICAL DATA:  Overdose.  Vomiting.  Dyspnea.  EXAM: CHEST  2 VIEW  COMPARISON:   05/03/2014  FINDINGS: Mild linear left base opacities may be atelectatic. Early infectious infiltrate or aspiration cannot be excluded. The right lung is clear. There is no pleural effusion. Pulmonary vasculature is normal. Hilar and mediastinal contours are unremarkable.  IMPRESSION: Mild linear left base opacities. Early infectious infiltrate or aspiration cannot be excluded.   Electronically Signed   By: Ellery Plunk M.D.   On: 08/02/2015 02:17   Ct Head Wo Contrast  08/02/2015   CLINICAL DATA:  Left-sided blurred vision. Suicidal ideation. Drug abuse. Bipolar disorder.  EXAM: CT HEAD WITHOUT CONTRAST  TECHNIQUE: Contiguous axial images were obtained from the base of the skull through the vertex without intravenous contrast.  COMPARISON:  09/19/2013  FINDINGS: No evidence of intracranial hemorrhage, brain edema, or other signs of acute infarction. No evidence of intracranial mass lesion or mass effect.  No abnormal extraaxial fluid collections identified. Ventricles are normal in size. No skull abnormality identified.  IMPRESSION: Negative noncontrast head CT.   Electronically Signed   By: Myles Rosenthal M.D.   On: 08/02/2015 07:43   Mr Brain Wo Contrast  08/02/2015   CLINICAL DATA:  34 year old male with left side blurred vision yesterday. Initial encounter.  EXAM: MRI HEAD WITHOUT CONTRAST  TECHNIQUE: Multiplanar, multiecho pulse sequences of the brain and surrounding structures were obtained without intravenous contrast.  COMPARISON:  Head CT without contrast 0710 hr today. Brain MRI 09/20/2013.  FINDINGS: Major intracranial vascular flow voids are stable and within normal limits. Stable and normal cerebral volume. No restricted diffusion to suggest acute infarction. No midline shift, mass effect, evidence of mass lesion, ventriculomegaly, extra-axial collection or acute intracranial hemorrhage. Cervicomedullary junction and pituitary are within normal limits. Negative visualized cervical spine. Wallace Cullens  and white matter signal is within normal limits for age throughout the brain. No chronic cerebral blood products identified.  Visible internal auditory structures appear normal. Mastoids are clear. Trace paranasal sinus mucosal thickening. Negative scalp soft tissues. Normal bone marrow signal. Orbits soft tissues appear stable and normal. Optic chiasm appears stable and normal.  IMPRESSION: Stable and negative noncontrast MRI appearance of the brain.   Electronically Signed   By: Odessa Fleming M.D.   On: 08/02/2015 18:10    Scheduled Meds: . amoxicillin-clavulanate  1 tablet Oral Q12H  . enoxaparin (LOVENOX) injection  40 mg Subcutaneous Q24H  . folic acid  1 mg Oral Daily  . LORazepam  0-4 mg Intravenous Q6H   Followed by  . [START ON 08/04/2015] LORazepam  0-4 mg Intravenous Q12H  . multivitamin with minerals  1 tablet Oral Daily  . thiamine  100 mg Oral Daily   Or  . thiamine  100 mg Intravenous Daily   Continuous Infusions:   Principal Problem:   Suicidal ideation Active Problems:   Cocaine abuse   Hyponatremia   Alcohol abuse   Aspiration pneumonia (HCC)   Syncope  Blurred vision    Time spent: 25 minutes    [facility] S  [facility] Pager [phone]. If 7PM-7AM, please contact night-coverage at [email or web address], password [email or web address] 08/03/2015, 11:01 AM  LOS: 1 day

## 2015-08-04 ENCOUNTER — Encounter (HOSPITAL_COMMUNITY): Payer: Self-pay | Admitting: Nurse Practitioner

## 2015-08-04 DIAGNOSIS — F332 Major depressive disorder, recurrent severe without psychotic features: Secondary | ICD-10-CM | POA: Diagnosis present

## 2015-08-04 DIAGNOSIS — R45851 Suicidal ideations: Secondary | ICD-10-CM

## 2015-08-04 MED ORDER — LORAZEPAM 1 MG PO TABS
1.0000 mg | ORAL_TABLET | Freq: Three times a day (TID) | ORAL | Status: AC
  Administered 2015-08-05 (×3): 1 mg via ORAL
  Filled 2015-08-04 (×3): qty 1

## 2015-08-04 MED ORDER — LORAZEPAM 1 MG PO TABS
1.0000 mg | ORAL_TABLET | Freq: Two times a day (BID) | ORAL | Status: AC
  Administered 2015-08-06 (×2): 1 mg via ORAL
  Filled 2015-08-04 (×2): qty 1

## 2015-08-04 MED ORDER — LORAZEPAM 1 MG PO TABS: 1.0000 mg | ORAL_TABLET | Freq: Four times a day (QID) | ORAL | Status: AC | PRN

## 2015-08-04 MED ORDER — LORAZEPAM 1 MG PO TABS
1.0000 mg | ORAL_TABLET | Freq: Every day | ORAL | Status: AC
  Administered 2015-08-07: 1 mg via ORAL
  Filled 2015-08-04: qty 1

## 2015-08-04 MED ORDER — LORAZEPAM 1 MG PO TABS
1.0000 mg | ORAL_TABLET | Freq: Four times a day (QID) | ORAL | Status: AC
  Administered 2015-08-04 (×4): 1 mg via ORAL
  Filled 2015-08-04 (×4): qty 1

## 2015-08-04 MED ORDER — ZOLPIDEM TARTRATE 10 MG PO TABS
10.0000 mg | ORAL_TABLET | Freq: Every evening | ORAL | Status: DC | PRN
  Administered 2015-08-04 – 2015-08-22 (×19): 10 mg via ORAL
  Filled 2015-08-04 (×19): qty 1

## 2015-08-04 NOTE — [facility] Group Notes (Signed)
[facility] LCSW Group Therapy  08/04/2015 2:28 PM  Type of Therapy:  Group Therapy  Participation Level:  Did Not Attend-pt invited. Chose to remain in bed.   Modes of Intervention:  Confrontation, Discussion, Education, Exploration, Problem-solving, Rapport Building, Socialization and Support  Summary of Progress/Problems:  Finding Balance in Life. Today's group focused on defining balance in one's own words, identifying things that can knock one off balance, and exploring healthy ways to maintain balance in life. Group members were asked to provide an example of a time when they felt off balance, describe how they handled that situation,and process healthier ways to regain balance in the future. Group members were asked to share the most important tool for maintaining balance that they learned while at [facility] and how they plan to apply this method after discharge.   Smart, Deshanna Kama LCSWA  08/04/2015, 2:28 PM

## 2015-08-04 NOTE — [facility] Group Notes (Signed)
Pt did not attend group.  General Wearing, MHT 

## 2015-08-04 NOTE — Plan of Care (Signed)
Problem: Diagnosis: Increased Risk For Suicide Attempt Goal: STG-Patient Will Comply With Medication Regime Outcome: Progressing Pt compliant with medication regimen     

## 2015-08-04 NOTE — Tx Team (Signed)
Interdisciplinary Treatment Plan Update (Adult)  Date:  08/04/2015  Time Reviewed:  8:10 AM   Progress in Treatment: Attending groups: No.New to unit. Continuing to assess.  Participating in groups:  No. Taking medication as prescribed:  Yes. Tolerating medication:  Yes. Family/Significant othe contact made:  SPE required for this pt.  Patient understands diagnosis:  Yes. and As evidenced by:  seeking treatment for SI with overdose of cocaine, alcohol abuse, depression, and medication stabilization. Discussing patient identified problems/goals with staff:  Yes. Medical problems stabilized or resolved:  Yes. Denies suicidal/homicidal ideation: Yes. Self report.  Issues/concerns per patient self-inventory:  Other:  Discharge Plan or Barriers: CSW assessing for appropriate referrals at this time. Pt has been admitted to [facility] twice in the past (Feb 2014/May 2014).   Reason for Continuation of Hospitalization: Depression Medication stabilization Suicidal ideation Withdrawal symptoms  Comments:  Curtis Blankenship is a 34 y.o. male with history of alcohol and cocaine abuse presented to the ER because of suicidal ideation. Patient states he has been drinking alcohol everyday and last evening had taken extra dose of cocaine with intention of suicide. Patient states that he passed out following the last dose of cocaine. When he woke up he started developing chest pressure which lasted for 1 hour and resolved. Patient has been having some left-sided blurred vision since then. Patient hired a cab and came to the ER. Prior to which patient also had one episode of nausea vomiting. In the ER patient's labs revealed positive cocaine with mild hyponatremia and chest x-ray showing possible infiltrate. Patient was placed on suicide precaution and admitted for further management.  Denies any shortness of breath and chest pain at this time is resolved. Still complains of some left-sided blurred  vision.  Estimated length of stay:  3-5 days   New goal(s): to develop effective after care plan.   Additional Comments:  Patient and CSW reviewed pt's identified goals and treatment plan. Patient verbalized understanding and agreed to treatment plan. CSW reviewed [facility] "Discharge Process and Patient Involvement" Form. Pt verbalized understanding of information provided and signed form.    Review of initial/current patient goals per problem list:  1. Goal(s): Patient will participate in aftercare plan  Met: No.   Target date: at discharge  As evidenced by: Patient will participate within aftercare plan AEB aftercare provider and housing plan at discharge being identified.  10/6: CSW assessing for appropriate referrals.   2. Goal (s): Patient will exhibit decreased depressive symptoms and suicidal ideations.  Met: No.    Target date: at discharge  As evidenced by: Patient will utilize self rating of depression at 3 or below and demonstrate decreased signs of depression or be deemed stable for discharge by MD.  10/6: Pt rates depression as high to day. Denies SI/HI/AVH.    3. Goal(s): Patient will demonstrate decreased signs of withdrawal due to substance abuse  Met:No.   Target date:at discharge   As evidenced by: Patient will produce a CIWA/COWS score of 0, have stable vitals signs, and no symptoms of withdrawal.    10/6: Pt reports moderate withdrawals with CIWA score of 11 and high sitting BP.   Attendees: Patient:   08/04/2015 8:10 AM   Family:   08/04/2015 8:10 AM   Physician:  Dr. Carlton Adam, MD 08/04/2015 8:10 AM   Nursing:   Guido Sander RN 08/04/2015 8:10 AM   Clinical Social Worker: Maxie Better, [place]  08/04/2015 8:10 AM   Clinical Social Worker:  Ander Purpura  Madie Reno 08/04/2015 8:10 AM   Other:  Gerline Legacy Nurse Case Manager 08/04/2015 8:10 AM   Other:  Lucinda Dell; [facility]  08/04/2015 8:10 AM   Other:   08/04/2015 8:10 AM   Other:  08/04/2015  8:10 AM   Other:  08/04/2015 8:10 AM   Other:  08/04/2015 8:10 AM    08/04/2015 8:10 AM    08/04/2015 8:10 AM    08/04/2015 8:10 AM    08/04/2015 8:10 AM    Scribe for Treatment Team:   Maxie Better, LCSWA  08/04/2015 8:10 AM

## 2015-08-04 NOTE — H&P (Signed)
Psychiatric Admission Assessment Adult  Patient Identification: Curtis Blankenship MRN:  [id number] Date of Evaluation:  08/04/2015 Chief Complaint:  PTSD Principal Diagnosis: <principal problem not specified> Diagnosis:   Patient Active Problem List   Diagnosis Date Noted  . Hyponatremia [E87.1] 08/02/2015  . Alcohol abuse [F10.10] 08/02/2015  . Suicidal ideation [R45.851] 08/02/2015  . Aspiration pneumonia ([place]) [J69.0] 08/02/2015  . Syncope [R55] 08/02/2015  . Blurred vision [H53.8]   . TIA (transient ischemic attack) [G45.9] 09/19/2013  . Disturbance of skin sensation [R20.9] 09/19/2013  . 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 Alcohol dependence ([place]) [F10.20] 12/01/2012  . Cocaine abuse [F14.10] 12/01/2012  . PTSD (post-traumatic stress disorder) [F43.10] 12/01/2012  . Depressive disorder [F32.9] 12/01/2012   History of Present Illness:: 34 Y/o male who states he had been  living in [place] for the last several months. . Came back to [facility] and was staying with his mother. Has been drinking a case a day for month or two as well as using cocaine IV. States he wants to kill himself. Admits he has researched ways of doing it. Ht is obsessed with finding effective ways of killing himself. He is considering going to [place] to a place where they do assisted suicide. States he does not want to live anymore.  The initial assessment is as follows: Curtis Blankenship is a 34 y.o. Male, 100% service-connected disabled veteran from [place] war, seen face-to-face for psychiatric consultation and evaluation of increased symptoms of substance abuse, depression, suicidal ideation and tried to overdose on cocaine to end his life and came to the hospital with chest pain. Patient reported he ran out of his medication for posttraumatic stress disorder and started drinking alcohol up to one case of beer a day and also started using cocaine and overdosed with intention to end his life before coming to the hospital.  Reportedly he was taken Zoloft and also clonazepam from the [place] hospital system in the past about 2 months ago. Patient endorses increased symptoms of depression, anxiety, paranoia, reexperiencing of trauma and nightmares. Past Psychiatric History: patient has multiple acute psychiatric hospitalizations in the past, reportedly admitted to [place] [facility] hospital in [place] x twice and [place] hospital x once in [place]. Patient has 1 previous acute psychiatric hospitalization at old [place], [place] about a year ago.  Associated Signs/Symptoms: Depression Symptoms:  depressed mood, anhedonia, insomnia, fatigue, feelings of worthlessness/guilt, difficulty concentrating, suicidal thoughts without plan, anxiety, panic attacks, loss of energy/fatigue, disturbed sleep, weight loss, decreased appetite, (Hypo) Manic Symptoms:  Irritable Mood, Labiality of Mood, Anxiety Symptoms:  Excessive Worry, Panic Symptoms, Psychotic Symptoms:  Hallucinations: Visual Paranoia, PTSD Symptoms: Had a traumatic exposure:  war Re-experiencing:  Flashbacks Intrusive Thoughts Nightmares Hypervigilance:  Yes Hyperarousal:  Difficulty Concentrating Emotional Numbness/Detachment Increased Startle Response Total Time spent with patient: 45 minutes  Past Psychiatric History:   Risk to Self: Is patient at risk for suicide?: No Risk to Others:  No Prior Inpatient Therapy:  [facility] [facility] Prior Outpatient Therapy:  [facility]  Alcohol Screening: 1. How often do you have a drink containing alcohol?: 4 or more times a week 2. How many drinks containing alcohol do you have on a typical day when you are drinking?: 10 or more 3. How often do you have six or more drinks on one occasion?: Weekly Preliminary Score: 7 4. How often during the last year have you found that you were not able to stop drinking once you had started?: Weekly  5. How often during the last year have you  failed to do what was normally expected from you becasue of drinking?: Weekly 6. How often during the last year have you needed a first drink in the morning to get yourself going after a heavy drinking session?: Less than monthly 7. How often during the last year have you had a feeling of guilt of remorse after drinking?: Less than monthly 8. How often during the last year have you been unable to remember what happened the night before because you had been drinking?: Less than monthly 9. Have you or someone else been injured as a result of your drinking?: No 10. Has a relative or friend or a doctor or another health worker been concerned about your drinking or suggested you cut down?: Yes, during the last year Alcohol Use Disorder Identification Test Final Score (AUDIT): 24 Brief Intervention: Yes Substance Abuse History in the last 12 months:  Yes.   Consequences of Substance Abuse: Legal Consequences:  2 DWI Blackouts:   Withdrawal Symptoms:   Diaphoresis Diarrhea Headaches Nausea Previous Psychotropic Medications: Yes  Psychological Evaluations: No  Past Medical History:  Past Medical History  Diagnosis Date  . Bipolar disorder ([place])   . PTSD (post-traumatic stress disorder)   . Depression     Past Surgical History  Procedure Laterality Date  . Appendectomy     Family History:  Family History  Problem Relation Age of Onset  . Hypertension Mother   . Alcoholism Other    Family Psychiatric  History: mother depression sister is "crazy" Social History:  History  Alcohol Use  . 97.2 oz/week  . 150 Cans of beer, 12 Shots of liquor per week    Comment: he drinks every day as much as he can     History  Drug Use  . Yes  . Special: Cocaine, Other-see comments, Benzodiazepines    Social History   Social History  . Marital Status: Single    Spouse Name: N/A  . Number of Children: N/A  . Years of Education: N/A   Social History Main Topics  . Smoking status: Current  Every Day Smoker -- 1.50 packs/day for 10 years    Types: Cigarettes  . Smokeless tobacco: Former Systems developer    Types: Snuff  . Alcohol Use: 97.2 oz/week    150 Cans of beer, 12 Shots of liquor per week     Comment: he drinks every day as much as he can  . Drug Use: Yes    Special: Cocaine, Other-see comments, Benzodiazepines  . Sexual Activity: Not Asked   Other Topics Concern  . None   Social History Narrative  Living with his mother. Single no children. Got his diploma. Was in the  [employer] for 5 years. 2009. 100 % Service connected.  Additional Social History:                         Allergies:  No Known Allergies Lab Results:  Results for orders placed or performed during the hospital encounter of 08/02/15 (from the past 48 hour(s))  Troponin I (q 6hr x 3)     Status: None   Collection Time: 08/02/15  1:49 PM  Result Value Ref Range   Troponin I <0.03 <0.031 ng/mL    Comment:        NO INDICATION OF MYOCARDIAL INJURY.   Legionella Pneumophila Serogp 1 Ur Ag     Status: None  Collection Time: 08/02/15  2:33 PM  Result Value Ref Range   L. pneumophila Serogp 1 Ur Ag Negative Negative    Comment: (NOTE) Performed At: [facility] [place], [place] [id number] Lindon Romp MD [id number]    Source of Sample URINE, RANDOM   Strep pneumoniae urinary antigen     Status: None   Collection Time: 08/02/15  2:33 PM  Result Value Ref Range   Strep Pneumo Urinary Antigen NEGATIVE NEGATIVE    Comment:        Infection due to S. pneumoniae cannot be absolutely ruled out since the antigen present may be below the detection limit of the test. Performed at [facility]   Troponin I (q 6hr x 3)     Status: None   Collection Time: 08/02/15  8:21 PM  Result Value Ref Range   Troponin I <0.03 <0.031 ng/mL    Comment:        NO INDICATION OF MYOCARDIAL INJURY.   CBC     Status: Abnormal   Collection Time: 08/03/15  5:16 AM  Result  Value Ref Range   WBC 3.8 (L) 4.0 - 10.5 K/uL   RBC 4.09 (L) 4.22 - 5.81 MIL/uL   Hemoglobin 12.9 (L) 13.0 - 17.0 g/dL   HCT 38.3 (L) 39.0 - 52.0 %   MCV 93.6 78.0 - 100.0 fL   MCH 31.5 26.0 - 34.0 pg   MCHC 33.7 30.0 - 36.0 g/dL   RDW 13.2 11.5 - 15.5 %   Platelets 258 150 - 400 K/uL  Basic metabolic panel     Status: Abnormal   Collection Time: 08/03/15  5:16 AM  Result Value Ref Range   Sodium 136 135 - 145 mmol/L   Potassium 3.9 3.5 - 5.1 mmol/L   Chloride 102 101 - 111 mmol/L   CO2 27 22 - 32 mmol/L   Glucose, Bld 104 (H) 65 - 99 mg/dL   BUN 12 6 - 20 mg/dL   Creatinine, Ser 1.15 0.61 - 1.24 mg/dL   Calcium 8.8 (L) 8.9 - 10.3 mg/dL   GFR calc non Af Amer >60 >60 mL/min   GFR calc Af Amer >60 >60 mL/min    Comment: (NOTE) The eGFR has been calculated using the CKD EPI equation. This calculation has not been validated in all clinical situations. eGFR's persistently <60 mL/min signify possible Chronic Kidney Disease.    Anion gap 7 5 - 15    Metabolic Disorder Labs:  Lab Results  Component Value Date   HGBA1C 5.0 09/19/2013   MPG 97 09/19/2013   No results found for: PROLACTIN Lab Results  Component Value Date   CHOL 181 09/19/2013   TRIG 153* 09/19/2013   HDL 51 09/19/2013   CHOLHDL 3.5 09/19/2013   VLDL 31 09/19/2013   LDLCALC 99 09/19/2013    Current Medications: Current Facility-Administered Medications  Medication Dose Route Frequency Provider Last Rate Last Dose  . alum & mag hydroxide-simeth (MAALOX/MYLANTA) 200-200-20 MG/5ML suspension 30 mL  30 mL Oral Q4H PRN Hampton Abbot, MD      . amoxicillin-clavulanate (AUGMENTIN) 875-125 MG per tablet 1 tablet  1 tablet Oral Q12H Hampton Abbot, MD   1 tablet at 08/04/15 8250  . chlordiazePOXIDE (LIBRIUM) capsule 25 mg  25 mg Oral Q6H PRN Laverle Hobby, PA-C      . chlordiazePOXIDE (LIBRIUM) capsule 25 mg  25 mg Oral QID Laverle Hobby, PA-C   25 mg at  08/04/15 0822   Followed by  . [START ON 08/05/2015]  chlordiazePOXIDE (LIBRIUM) capsule 25 mg  25 mg Oral TID Laverle Hobby, PA-C       Followed by  . [START ON 08/06/2015] chlordiazePOXIDE (LIBRIUM) capsule 25 mg  25 mg Oral BH-qamhs Spencer E Simon, PA-C       Followed by  . [START ON 08/08/2015] chlordiazePOXIDE (LIBRIUM) capsule 25 mg  25 mg Oral Daily Laverle Hobby, PA-C      . hydrOXYzine (ATARAX/VISTARIL) tablet 25 mg  25 mg Oral Q6H PRN Laverle Hobby, PA-C   25 mg at 08/03/15 2332  . loperamide (IMODIUM) capsule 2-4 mg  2-4 mg Oral PRN Laverle Hobby, PA-C      . magnesium hydroxide (MILK OF MAGNESIA) suspension 30 mL  30 mL Oral Daily PRN Hampton Abbot, MD      . multivitamin with minerals tablet 1 tablet  1 tablet Oral Daily Laverle Hobby, PA-C   1 tablet at 08/04/15 2233  . ondansetron (ZOFRAN-ODT) disintegrating tablet 4 mg  4 mg Oral Q6H PRN Laverle Hobby, PA-C      . thiamine (B-1) injection 100 mg  100 mg Intramuscular Once Laverle Hobby, PA-C   100 mg at 08/03/15 2330  . thiamine (VITAMIN B-1) tablet 100 mg  100 mg Oral Daily Laverle Hobby, PA-C   100 mg at 08/04/15 6122  . traZODone (DESYREL) tablet 50 mg  50 mg Oral QHS,MR X 1 Spencer E Simon, PA-C       PTA Medications: Prescriptions prior to admission  Medication Sig Dispense Refill Last Dose  . amoxicillin-clavulanate (AUGMENTIN) 875-125 MG tablet Take 1 tablet by mouth every 12 (twelve) hours. 12 tablet 0   . folic acid (FOLVITE) 1 MG tablet Take 1 tablet (1 mg total) by mouth daily.     Marland Kitchen ibuprofen (ADVIL,MOTRIN) 200 MG tablet Take 200-400 mg by mouth every 6 (six) hours as needed (for pain.).   unknown  . LORazepam (ATIVAN) 1 MG tablet Take 1 tablet (1 mg total) by mouth every 6 (six) hours as needed (CIWA-AR > 8  -OR-  withdrawal symptoms:  anxiety, agitation, insomnia, diaphoresis, nausea, vomiting, tremors, tachycardia, or hypertension.). 30 tablet 0   . senna-docusate (SENOKOT-S) 8.6-50 MG tablet Take 1 tablet by mouth at bedtime as needed for mild  constipation.     . thiamine 100 MG tablet Take 1 tablet (100 mg total) by mouth daily.       Musculoskeletal: Strength & Muscle Tone: within normal limits Gait & Station: normal Patient leans: normal  Psychiatric Specialty Exam: Physical Exam  Review of Systems  Constitutional: Positive for malaise/fatigue.  HENT: Negative.   Eyes: Negative.   Respiratory: Negative.   Cardiovascular: Positive for chest pain.  Gastrointestinal: Negative.   Genitourinary: Negative.   Musculoskeletal: Negative.   Skin: Negative.   Neurological: Positive for dizziness and weakness.  Endo/Heme/Allergies: Negative.   Psychiatric/Behavioral: Positive for depression, suicidal ideas and substance abuse. The patient is nervous/anxious.     Blood pressure 125/88, pulse 85, temperature 98.7 F (37.1 C), temperature source Oral, resp. rate 16, height '5\' 11"'  (1.803 m), weight 77.111 kg (170 lb).Body mass index is 23.72 kg/(m^2).  General Appearance: Disheveled  Eye Contact::  Minimal  Speech:  Clear and Coherent and not spontaneous  Volume:  fluctuates  Mood:  Dysphoric  Affect:  Restricted  Thought Process:  Coherent and Goal Directed  Orientation:  Full (Time, Place,  and Person)  Thought Content:  symptoms events worries concerns  Suicidal Thoughts:  Yes.  without intent/plan  Homicidal Thoughts:  No  Memory:  Immediate;   Fair Recent;   Fair Remote;   Fair  Judgement:  Fair  Insight:  Present  Psychomotor Activity:  Restlessness  Concentration:  Fair  Recall:  [employer] of Knowledge:Fair  Language: Fair  Akathisia:  No  Handed:  Right  AIMS (if indicated):     Assets:  Desire for Improvement  ADL's:  Intact  Cognition: WNL  Sleep:        Treatment Plan Summary: Daily contact with patient to assess and evaluate symptoms and progress in treatment and Medication management Supportive approach/coping skills Alcohol dependence; Ativan detox protocol/work a relapse prevention  plan Cocaine Dependence; monitor mood fluctuations from coming off the cocaine Mood instability; reassess for a mood stabilizer PTSD: will resume the minipress at 1 mg HS, states he was being given 2 mg TID what affected his BP and kept him dizzy Depression/PTSD; will hold the Zoloft, he states it numbs him would rather try a different medication CBT/mindfulness Explore residential treatment options Observation Level/Precautions:  15 minute checks  Laboratory:  As per the ED  Psychotherapy:  Individual/group  Medications:  Will detox with Librium, keep the Minipress  Consultations:    Discharge Concerns:  Need for rehab  Estimated LOS: 5-7 days  Other:     I certify that inpatient services furnished can reasonably be expected to improve the patient's condition.   Laila Myhre A 10/6/20169:09 AM

## 2015-08-04 NOTE — BHH Suicide Risk Assessment (Signed)
[facility] Admission Suicide Risk Assessment   Nursing information obtained from:    Demographic factors:    Current Mental Status:    Loss Factors:    Historical Factors:    Risk Reduction Factors:    Total Time spent with patient: 45 minutes Principal Problem: <principal problem not specified> Diagnosis:   Patient Active Problem List   Diagnosis Date Noted  . Hyponatremia [E87.1] 08/02/2015  . Alcohol abuse [F10.10] 08/02/2015  . Suicidal ideation [R45.851] 08/02/2015  . Aspiration pneumonia ([facility]) [J69.0] 08/02/2015  . Syncope [R55] 08/02/2015  . Blurred vision [H53.8]   . TIA (transient ischemic attack) [G45.9] 09/19/2013  . Disturbance of skin sensation [R20.9] 09/19/2013  . 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 Alcohol dependence ([facility]) [F10.20] 12/01/2012  . Cocaine abuse [F14.10] 12/01/2012  . PTSD (post-traumatic stress disorder) [F43.10] 12/01/2012  . Depressive disorder [F32.9] 12/01/2012     Continued Clinical Symptoms:  Alcohol Use Disorder Identification Test Final Score (AUDIT): 24 The "Alcohol Use Disorders Identification Test", Guidelines for Use in Primary Care, Second Edition.  World Science writer [facility]). Score between 0-7:  no or low risk or alcohol related problems. Score between 8-15:  moderate risk of alcohol related problems. Score between 16-19:  high risk of alcohol related problems. Score 20 or above:  warrants further diagnostic evaluation for alcohol dependence and treatment.   CLINICAL FACTORS:   Severe Anxiety and/or Agitation Depression:   Comorbid alcohol abuse/dependence Alcohol/Substance Abuse/Dependencies   Psychiatric Specialty Exam: Physical Exam  ROS  Blood pressure 99/53, pulse 54, temperature 97.8 F (36.6 C), temperature source Oral, resp. rate 18, height  (1.803 m), weight 77.111 kg (170 lb).Body mass index is 23.72 kg/(m^2).    COGNITIVE FEATURES THAT CONTRIBUTE TO RISK:  Closed-mindedness, Polarized thinking and Thought  constriction (tunnel vision)    SUICIDE RISK:   Moderate:  Frequent suicidal ideation with limited intensity, and duration, some specificity in terms of plans, no associated intent, good self-control, limited dysphoria/symptomatology, some risk factors present, and identifiable protective factors, including available and accessible social support.  PLAN OF CARE: See Admission H and P  Medical Decision Making:  Review of Psycho-Social Stressors (1), Review or order clinical lab tests (1), Review of Medication Regimen & Side Effects (2) and Review of New Medication or Change in Dosage (2)  I certify that inpatient services furnished can reasonably be expected to improve the patient's condition.   Ariea Rochin A 08/04/2015, 2:44 PM

## 2015-08-04 NOTE — Plan of Care (Signed)
Problem: Diagnosis: Increased Risk For Suicide Attempt Goal: STG-Patient Will Attend All Groups On The Unit Outcome: Not Progressing Pt not attending groups on the unit.

## 2015-08-04 NOTE — BHH Counselor (Signed)
Adult Comprehensive Assessment  Patient ID: Curtis Blankenship, male   DOB: [birth year], 34 y.o.   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  How long has patient lived in current situation?: 1 month What is atmosphere in current home: Temporary  Family History:  Marital status: Single Does patient have children?: No  Childhood History:  By whom was/is the patient raised?: Both parents Additional childhood history information: Pt reports having a decent childhood Description of patient's relationship with caregiver when they were a child: Pt reports getting along with his family "so-so" growing up Patient's description of current relationship with people who raised him/her: Pt states that his relationship is strained with family.  Does patient have siblings?: Yes Number of Siblings: 2  Description of patient's current relationship with siblings: 1 brother, 1 sister. Relationship is strained with siblings.  Did patient suffer any verbal/emotional/physical/sexual abuse as a child?: No Did patient suffer from severe childhood neglect?: No Has patient ever been sexually abused/assaulted/raped as an adolescent or adult?: No Witnessed domestic violence?: No Has patient been effected by domestic violence as an adult?:  No  Education:  Highest grade of school patient has completed: Some college Currently a student?: No Learning disability?: No  Employment/Work Situation:  Employment situation: On disability Why is patient on disability: Pt reports being 100% disabled through the [place] due to Traumatic Brain Injury and PTSD.  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 States he wants to kill himself. If attempted suicide, did drugs/alcohol play a role in this?: Yes-prior to admission, pt used cocaine (IV) in attempt to burst heart.  Alcohol/Substance Abuse Treatment Hx: Past detox;Past Tx, Outpatient;Past Tx, Inpatient If yes, describe treatment: Reports outpatient services through the [place] and [place] at the [place] in [place], [place] and [place]. Past treatment at [facility] 2x in 2014 for similar issues.  Has alcohol/substance abuse ever caused legal problems?: No  Social Support System:  Patient's Community Support System: Poor Describe Community Support System: Pt reports not support.  Type of faith/religion: None reported How does patient's faith help to cope with current illness?: N/A  Leisure/Recreation:  Leisure and Hobbies: Pt states "getting high"  Strengths/Needs:  What things does the patient do well?: Pt reports "drinking" In what areas does patient struggle / problems for patient: Substance  use  Discharge Plan:  Does patient have access to transportation?: No Plan for no  access to transportation at discharge: pt reports no transportation, CSW will assess Will patient be returning to same living situation after discharge?: No Plan for living situation after discharge: Pt unable to discuss aftercare. "I just want to die."  Currently receiving community mental health services: no-not according to pt.  If no, would patient like referral for services when discharged?: no-pt not participating in discharge plan at this time. He is 100% service connected through the [place].  Does patient have financial barriers related to discharge medications?: No  Summary/Recommendations:   Patient is a 34 year old Caucasian Male with a diagnosis of Alcohol Abuse and MDD. Curtis Blankenship states he had been living in [place] for the last several months and came back to [place] to live with his mother. Pt reports drinking a case a day for month or two as well as using cocaine IV. States he wants to kill himself. Admits he has researched ways of doing it. Ht is obsessed with finding effective ways of killing himself. He is considering going to [place] to a place where they do assisted suicide. States he does not want to live anymore. He is currently not interested in dicussing aftercare plan due to SI/depression. He is 100% service-connected disabled veteran from [place] war, seen face-to-face for psychiatric consultation and evaluation of increased symptoms of substance abuse, depression, suicidal ideation and tried to overdose on cocaine to end his life and came to the hospital with chest pain. Patient reported he ran out of his medication for posttraumatic stress disorder and started drinking alcohol up to one case of beer a day and also started using cocaine and overdosed with intention to end his life before coming to the hospital. Reportedly he was taken Zoloft and also clonazepam from the [place] hospital system in the  past about 2 months ago. Patient endorses increased symptoms of depression, anxiety, paranoia, reexperiencing of trauma and nightmares. Past Psychiatric History: patient has multiple acute psychiatric hospitalizations in the past, reportedly admitted to [place] [facility] in [place] x twice and [place] [facility] x once in [place]. Patient has 1 previous acute psychiatric hospitalization at old [place], [place] about a year ago. Recommendations for pt include: crisis stabilization, medication evaluation, group therapy and psycho education in addition to case management for discharge planning. CSW assessing for appropriate referrals at this time.    Smart, Geet Hosking LCSWA 08/04/2015 8:15 AM

## 2015-08-04 NOTE — Progress Notes (Signed)
D: Pt irritable on approach. Forwards little on approach. Not open to treatment. Pt not engaged in the unit. Refusing to attend groups on the unit. Noted in bed majority of the shift. Pt reports withdrawal symptoms, but refuses to go into detail with undersign. Unkempt appearance noted. Denies SI at this time. Denies AVH.   A:Special checks q 15 mins in place for safety. Medication administered per MD order (see eMAR). CIWA protocol in place. Pt encouraged to be active on the unit.  R:Safety mainained. Compliant with medication regimen; with much encouragement. Will continue to monitor.

## 2015-08-04 NOTE — Progress Notes (Signed)
Pt presents with brighter affect. In line to go to the dining room for dinner.

## 2015-08-04 NOTE — Progress Notes (Signed)
Patient ID: Curtis Blankenship, male   DOB: 12/21/1980, 34 y.o.   MRN: [id number] PER STATE REGULATIONS 482.30  THIS CHART WAS REVIEWED FOR MEDICAL NECESSITY WITH RESPECT TO THE PATIENT'S ADMISSION/DURATION OF STAY.  NEXT REVIEW DATE:08/07/15  Loura Halt, RN, BSN CASE MANAGER

## 2015-08-04 NOTE — BHH Group Notes (Signed)
BHH Group Notes:  (Nursing/MHT/Case Management/Adjunct)  Date:  08/04/2015  Time:  0900 Type of Therapy:  Nurse Education  Participation Level:  Did Not Attend    Summary of Progress/Problems:  Dara Hoyer 08/04/2015, 9:59 AM

## 2015-08-05 MED ORDER — LAMOTRIGINE 25 MG PO TABS
25.0000 mg | ORAL_TABLET | Freq: Every day | ORAL | Status: DC
  Administered 2015-08-06: 25 mg via ORAL
  Filled 2015-08-05 (×4): qty 1

## 2015-08-05 NOTE — Progress Notes (Signed)
[facility] MD Progress Note  08/05/2015 3:46 PM Curtis Blankenship  MRN:  [id number] Subjective:  Curtis Blankenship is still endorsing suicidal ideas, " has given up." He is willing to take medications but states he does not belief they are going to make a difference. He was in [place] for 6 months and came to have some shots to go to [place]. Shortly after he left here in 2014 he went to the [place] in [place]. From there he was sent to a [place] program in [place]. He staid around a year in that program. His mood is unstable. Still will irritability anger Principal Problem: Severe recurrent major depression without psychotic features (HCC) Diagnosis:   Patient Active Problem List   Diagnosis Date Noted  . Severe recurrent major depression without psychotic features (HCC) [F33.2] 08/04/2015  . Hyponatremia [E87.1] 08/02/2015  . Alcohol abuse [F10.10] 08/02/2015  . Suicidal ideation [R45.851] 08/02/2015  . Aspiration pneumonia (HCC) [J69.0] 08/02/2015  . Syncope [R55] 08/02/2015  . Blurred vision [H53.8]   . TIA (transient ischemic attack) [G45.9] 09/19/2013  . Disturbance of skin sensation [R20.9] 09/19/2013  . 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 Alcohol dependence (HCC) [F10.20] 12/01/2012  . Cocaine abuse [F14.10] 12/01/2012  . PTSD (post-traumatic stress disorder) [F43.10] 12/01/2012  . Depressive disorder [F32.9] 12/01/2012   Total Time spent with patient: 30 minutes  Past Psychiatric History: see admission H and P  Past Medical History:  Past Medical History  Diagnosis Date  . Bipolar disorder (HCC)   . PTSD (post-traumatic stress disorder)   . Depression     Past Surgical History  Procedure Laterality Date  . Appendectomy     Family History:  Family History  Problem Relation Age of Onset  . Hypertension Mother   . Alcoholism Other    Family Psychiatric  History: see H and P Social History:  History  Alcohol Use  . 97.2 oz/week  . 150 Cans of beer, 12 Shots of liquor per week    Comment:  he drinks every day as much as he can     History  Drug Use  . Yes  . Special: Cocaine, Other-see comments, Benzodiazepines    Social History   Social History  . Marital Status: Single    Spouse Name: N/A  . Number of Children: N/A  . Years of Education: N/A   Social History Main Topics  . Smoking status: Current Every Day Smoker -- 1.50 packs/day for 10 years    Types: Cigarettes  . Smokeless tobacco: Former Neurosurgeon    Types: Snuff  . Alcohol Use: 97.2 oz/week    150 Cans of beer, 12 Shots of liquor per week     Comment: he drinks every day as much as he can  . Drug Use: Yes    Special: Cocaine, Other-see comments, Benzodiazepines  . Sexual Activity: Not Asked   Other Topics Concern  . None   Social History Narrative   Additional Social History:                         Sleep: Fair  Appetite:  Poor  Current Medications: Current Facility-Administered Medications  Medication Dose Route Frequency Provider Last Rate Last Dose  . alum & mag hydroxide-simeth (MAALOX/MYLANTA) 200-200-20 MG/5ML suspension 30 mL  30 mL Oral Q4H PRN Nelly Rout, MD      . amoxicillin-clavulanate (AUGMENTIN) 875-125 MG per tablet 1 tablet  1 tablet Oral Q12H Nelly Rout, MD  1 tablet at 08/05/15 0955  . chlordiazePOXIDE (LIBRIUM) capsule 25 mg  25 mg Oral Q6H PRN Kerry Hough, PA-C      . hydrOXYzine (ATARAX/VISTARIL) tablet 25 mg  25 mg Oral Q6H PRN Kerry Hough, PA-C   25 mg at 08/03/15 2332  . loperamide (IMODIUM) capsule 2-4 mg  2-4 mg Oral PRN Kerry Hough, PA-C      . LORazepam (ATIVAN) tablet 1 mg  1 mg Oral Q6H PRN Rachael Fee, MD      . LORazepam (ATIVAN) tablet 1 mg  1 mg Oral TID Rachael Fee, MD   1 mg at 08/05/15 1204   Followed by  . [START ON 08/06/2015] LORazepam (ATIVAN) tablet 1 mg  1 mg Oral BID Rachael Fee, MD       Followed by  . [START ON 08/07/2015] LORazepam (ATIVAN) tablet 1 mg  1 mg Oral Daily Rachael Fee, MD      . magnesium hydroxide  (MILK OF MAGNESIA) suspension 30 mL  30 mL Oral Daily PRN Nelly Rout, MD      . multivitamin with minerals tablet 1 tablet  1 tablet Oral Daily Kerry Hough, PA-C   1 tablet at 08/05/15 0955  . ondansetron (ZOFRAN-ODT) disintegrating tablet 4 mg  4 mg Oral Q6H PRN Kerry Hough, PA-C      . thiamine (B-1) injection 100 mg  100 mg Intramuscular Once Kerry Hough, PA-C   100 mg at 08/03/15 2330  . thiamine (VITAMIN B-1) tablet 100 mg  100 mg Oral Daily Kerry Hough, PA-C   100 mg at 08/05/15 1610  . traZODone (DESYREL) tablet 50 mg  50 mg Oral QHS,MR X 1 Kerry Hough, PA-C   50 mg at 08/04/15 2216  . zolpidem (AMBIEN) tablet 10 mg  10 mg Oral QHS PRN Rachael Fee, MD   10 mg at 08/04/15 2216    Lab Results: No results found for this or any previous visit (from the past 48 hour(s)).  Physical Findings: AIMS: Facial and Oral Movements Muscles of Facial Expression: None, normal Lips and Perioral Area: None, normal Jaw: None, normal Tongue: None, normal,Extremity Movements Upper (arms, wrists, hands, fingers): None, normal Lower (legs, knees, ankles, toes): None, normal, Trunk Movements Neck, shoulders, hips: None, normal, Overall Severity Severity of abnormal movements (highest score from questions above): None, normal Incapacitation due to abnormal movements: None, normal Patient's awareness of abnormal movements (rate only patient's report): No Awareness, Dental Status Current problems with teeth and/or dentures?: No Does patient usually wear dentures?: No  CIWA:  CIWA-Ar Total: 0 COWS:  COWS Total Score: 6  Musculoskeletal: Strength & Muscle Tone: within normal limits Gait & Station: normal Patient leans: normal  Psychiatric Specialty Exam: Review of Systems  Constitutional: Positive for malaise/fatigue.  HENT: Negative.   Eyes: Negative.   Respiratory: Negative.   Cardiovascular: Negative.   Gastrointestinal: Negative.   Genitourinary: Negative.    Musculoskeletal: Negative.   Skin: Negative.   Neurological: Positive for weakness.  Endo/Heme/Allergies: Negative.   Psychiatric/Behavioral: Positive for depression, suicidal ideas and substance abuse. The patient is nervous/anxious.     Blood pressure 117/66, pulse 91, temperature 97.7 F (36.5 C), temperature source Oral, resp. rate 16, height  (1.803 m), weight 77.111 kg (170 lb).Body mass index is 23.72 kg/(m^2).  General Appearance: Disheveled  Eye Contact::  Minimal  Speech:  Clear and Coherent  Volume:  Decreased  Mood:  Dysphoric and Irritable  Affect:  Restricted  Thought Process:  Coherent and Goal Directed  Orientation:  Full (Time, Place, and Person)  Thought Content:  symptoms events worries concerns  Suicidal Thoughts:  Yes.  without intent/plan  Homicidal Thoughts:  No  Memory:  Immediate;   Fair Recent;   Fair Remote;   Fair  Judgement:  Fair  Insight:  Present  Psychomotor Activity:  Restlessness  Concentration:  Fair  Recall:  [employer] of Knowledge:Fair  Language: Fair  Akathisia:  No  Handed:  Right  AIMS (if indicated):     Assets:  Resilience Talents/Skills  ADL's:  Intact  Cognition: WNL  Sleep:  Number of Hours: 6   Treatment Plan Summary: Daily contact with patient to assess and evaluate symptoms and progress in treatment and Medication management Supportive approach/coping skills Alcohol cocaine benzo abuse; continue the Ativan detox protocol/work a relapse prevention plan PTSD; continue the Prazosin 1 mg HS Mood instability; will reassess for a mood stabilizer. Has used Depakote, Tegretol but cant say if they were effective or not. Will try Lamictal given the effect on the depression. Will consider adding Abilify Use CBT/mindfulness Olamae Ferrara A 08/05/2015, 3:46 PM

## 2015-08-05 NOTE — BHH Group Notes (Signed)
[facility] LCSW Aftercare Discharge Planning Group Note   08/05/2015 11:09 AM  Participation Quality:  Invited-DID NOT ATTEND. Pt chose to remain in bed.   Smart, [employer]

## 2015-08-05 NOTE — Progress Notes (Signed)
Patient ID: Curtis Blankenship, male   DOB: 05/21/1981, 34 y.o.   MRN: [id number] D: Patient in room on approach. Pt mood and affect appeared depressed and flat. Pt in room most of the evening with little interaction from peers. Pt endorses passive suicidal ideation without a plan. Pt attended and participated in evening wrap up group. No acute distressed noted at this time.   A: Met with pt 1:1. Medications administered as prescribed. Support and encouragement provided to attend groups and engage in milieu. Pt encouraged to discuss feelings and come to staff with any question or concerns.   R: Patient remains safe and complaint with medications.

## 2015-08-05 NOTE — Plan of Care (Signed)
Problem: Diagnosis: Increased Risk For Suicide Attempt Goal: STG-Patient Will Attend All Groups On The Unit Outcome: Not Progressing Pt did not attend evening karaoke group.      

## 2015-08-05 NOTE — Progress Notes (Signed)
Recreation Therapy Notes  Date: 10.07.2016 Time: 9:30am Location: [address] Group room   Group Topic: Stress Management  Goal Area(s) Addresses:  Patient will actively participate in stress management techniques presented during session.   Behavioral Response: Did not attend.   Illianna Paschal L Sharen Youngren, LRT/CTRS        Adelae Yodice L 08/05/2015 9:59 AM 

## 2015-08-05 NOTE — Progress Notes (Signed)
D: Curtis Blankenship) is still easily irritated this evening however he is a little more conversant. Talks about being in the [employer] in [place] as well as traveling to [place]. He talks about wanting to surf in the waters of [place]. Then the conversation switches to " So when are you guys going to euthanize me"? He goes into a long discussion of Obama and a law to euthanize people who are dying. I change the topic of conversation back to [place]. He denies SI/HI/AVH at this time. Still isolating. States he doesn't like being around a lot of people.  A: Encouragement and support given. R: Medication administered as prescribed.

## 2015-08-05 NOTE — Clinical Social Work Note (Signed)
CSW made 2nd attempt to meet with pt to discuss aftercare/discharge plan. Pt in bed, not answering, requesting to sleep.   Trula Slade, LCSWA Clinical Social Worker 08/05/2015 2:46 PM

## 2015-08-05 NOTE — BHH Suicide Risk Assessment (Signed)
BHH INPATIENT:  Family/Significant Other Suicide Prevention Education  Suicide Prevention Education:  Patient Refusal for Family/Significant Other Suicide Prevention Education: The patient Curtis Blankenship has refused to provide written consent for family/significant other to be provided Family/Significant Other Suicide Prevention Education during admission and/or prior to discharge.  Physician notified.  SPE completed with pt, as pt refused to consent to family contact. SPI pamphlet provided to pt and pt was encouraged to share information with support network, ask questions, and talk about any concerns relating to SPE. Pt denies access to guns/firearms and verbalized understanding of information provided. Mobile Crisis information also provided to pt.   Smart, Tremaine Fuhriman LCSWA  08/05/2015, 11:06 AM

## 2015-08-05 NOTE — Plan of Care (Signed)
Problem: Alteration in mood & ability to function due to Goal: STG-Patient will comply with prescribed medication regimen (Patient will comply with prescribed medication regimen)  Outcome: Progressing Pt complaint with medication regime     

## 2015-08-05 NOTE — Progress Notes (Signed)
D: Pt continues to be very flat and depressed on the unit today. Pt also continues to be very isolative and has been in the room the whole day. Pt has been cooperative with his meds but require a lot of prompting. Pt sated he did not sleep well last night because he kept waking up. Pt reported that his depression was a 10, his hopelessness was a 10, and that his anxiety was a 10. Pt reported being negative SI/HI, no AH/VH noted. A: 15 min checks continued for patient safety. R: Pts safety maintained.

## 2015-08-05 NOTE — BHH Group Notes (Signed)
BHH LCSW Group Therapy  08/05/2015 12:06 PM  Type of Therapy:  Group Therapy  Participation Level:  Did Not Attend-invited. Chose to remain in bed.   Modes of Intervention:  Confrontation, Discussion, Education, Exploration, Problem-solving, Rapport Building, Socialization and Support  Summary of Progress/Problems: Feelings around Relapse. Group members discussed the meaning of relapse and shared personal stories of relapse, how it affected them and others, and how they perceived themselves during this time. Group members were encouraged to identify triggers, warning signs and coping skills used when facing the possibility of relapse. Social supports were discussed and explored in detail. Post Acute Withdrawal Syndrome (handout provided) was introduced and examined. Pt's were encouraged to ask questions, talk about key points associated with PAWS, and process this information in terms of relapse prevention.   Smart, Curtis Blankenship LCSWA  08/05/2015, 12:06 PM

## 2015-08-06 DIAGNOSIS — F1410 Cocaine abuse, uncomplicated: Secondary | ICD-10-CM

## 2015-08-06 DIAGNOSIS — F4310 Post-traumatic stress disorder, unspecified: Secondary | ICD-10-CM

## 2015-08-06 DIAGNOSIS — F332 Major depressive disorder, recurrent severe without psychotic features: Principal | ICD-10-CM

## 2015-08-06 DIAGNOSIS — F1020 Alcohol dependence, uncomplicated: Secondary | ICD-10-CM

## 2015-08-06 MED ORDER — PRAZOSIN HCL 1 MG PO CAPS
1.0000 mg | ORAL_CAPSULE | Freq: Every day | ORAL | Status: DC
  Administered 2015-08-06: 1 mg via ORAL
  Filled 2015-08-06 (×4): qty 1

## 2015-08-06 MED ORDER — LAMOTRIGINE 25 MG PO TABS
25.0000 mg | ORAL_TABLET | Freq: Two times a day (BID) | ORAL | Status: DC
  Administered 2015-08-06 – 2015-08-11 (×11): 25 mg via ORAL
  Filled 2015-08-06 (×15): qty 1

## 2015-08-06 NOTE — Progress Notes (Signed)
D. Pt has been visible in the milieu interacting with peers and staff. Pt has been cooperative with his medication. Pt asked the writer if we do euthanization here," I want you to euthanize me, some country they do that, I wish  could just go peaceful." I told him we don't do that here, instead we are here to give help.Pt reported that his depression was a 5, his hopelessness was a 10, and that his anxiety was a 5. Pt reported being negative SI/HI, no AH/VH noted. A: 15 min checks continued for patient safety. R: Pts safety maintained.

## 2015-08-06 NOTE — Plan of Care (Signed)
Problem: Diagnosis: Increased Risk For Suicide Attempt Goal: LTG-Patient Will Report Improved Mood and Deny Suicidal LTG (by discharge) Patient will report improved mood and deny suicidal ideation.  Outcome: Not Progressing Pt denies SI, but talking of " I don't have balls to do it myself, I wish one can make me go peacefully."

## 2015-08-06 NOTE — Progress Notes (Signed)
[facility] MD Progress Note  08/06/2015  Curtis Blankenship  MRN:  [id number] Subjective:  Pt states: "what's the difference? It doesn't really matter what happens. It's not getting any better."  Objective: Pt seen and chart reviewed. Pt is alert/oriented x4, depressed, agitated, irritable, yet appropriate for the most part during the assessment. Pt reports suicidal ideation without intent at this time. Pt denies homicidal ideation. Denies psychosis and does not appear to be responding to internal stimuil. Pt is clearly very depressed and hopeless, reporting that he feels worthless and in a state of despair.   Principal Problem: Severe recurrent major depression without psychotic features (HCC) Diagnosis:   Patient Active Problem List   Diagnosis Date Noted  . Severe recurrent major depression without psychotic features (HCC) [F33.2] 08/04/2015    Priority: High  . Alcohol dependence (HCC) [F10.20] 12/01/2012    Priority: Medium  . Cocaine abuse [F14.10] 12/01/2012    Priority: Medium  . PTSD (post-traumatic stress disorder) [F43.10] 12/01/2012    Priority: Medium  . Hyponatremia [E87.1] 08/02/2015  . Alcohol abuse [F10.10] 08/02/2015  . Suicidal ideation [R45.851] 08/02/2015  . Aspiration pneumonia (HCC) [J69.0] 08/02/2015  . Syncope [R55] 08/02/2015  . Blurred vision [H53.8]   . TIA (transient ischemic attack) [G45.9] 09/19/2013  . Disturbance of skin sensation [R20.9] 09/19/2013  . 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 Depressive disorder [F32.9] 12/01/2012   Total Time spent with patient: 15 minutes  Past Psychiatric History: See H&P  Past Medical History:  Past Medical History  Diagnosis Date  . Bipolar disorder (HCC)   . PTSD (post-traumatic stress disorder)   . Depression     Past Surgical History  Procedure Laterality Date  . Appendectomy     Family History:  Family History  Problem Relation Age of Onset  . Hypertension Mother   . Alcoholism Other    Family Psychiatric   History: see H and P Social History:  History  Alcohol Use  . 97.2 oz/week  . 150 Cans of beer, 12 Shots of liquor per week    Comment: he drinks every day as much as he can     History  Drug Use  . Yes  . Special: Cocaine, Other-see comments, Benzodiazepines    Social History   Social History  . Marital Status: Single    Spouse Name: N/A  . Number of Children: N/A  . Years of Education: N/A   Social History Main Topics  . Smoking status: Current Every Day Smoker -- 1.50 packs/day for 10 years    Types: Cigarettes  . Smokeless tobacco: Former Neurosurgeon    Types: Snuff  . Alcohol Use: 97.2 oz/week    150 Cans of beer, 12 Shots of liquor per week     Comment: he drinks every day as much as he can  . Drug Use: Yes    Special: Cocaine, Other-see comments, Benzodiazepines  . Sexual Activity: Not Asked   Other Topics Concern  . None   Social History Narrative   Additional Social History:                         Sleep: Fair  Appetite:  Poor  Current Medications: Current Facility-Administered Medications  Medication Dose Route Frequency Provider Last Rate Last Dose  . alum & mag hydroxide-simeth (MAALOX/MYLANTA) 200-200-20 MG/5ML suspension 30 mL  30 mL Oral Q4H PRN Nelly Rout, MD      . chlordiazePOXIDE (LIBRIUM) capsule 25 mg  25 mg Oral Q6H PRN Kerry Hough, PA-C      . hydrOXYzine (ATARAX/VISTARIL) tablet 25 mg  25 mg Oral Q6H PRN Kerry Hough, PA-C   25 mg at 08/03/15 2332  . lamoTRIgine (LAMICTAL) tablet 25 mg  25 mg Oral BID Beau Fanny, FNP      . loperamide (IMODIUM) capsule 2-4 mg  2-4 mg Oral PRN Kerry Hough, PA-C      . LORazepam (ATIVAN) tablet 1 mg  1 mg Oral Q6H PRN Rachael Fee, MD      . LORazepam (ATIVAN) tablet 1 mg  1 mg Oral BID Rachael Fee, MD   1 mg at 08/06/15 0758   Followed by  . [START ON 08/07/2015] LORazepam (ATIVAN) tablet 1 mg  1 mg Oral Daily Rachael Fee, MD      . magnesium hydroxide (MILK OF MAGNESIA)  suspension 30 mL  30 mL Oral Daily PRN Nelly Rout, MD      . multivitamin with minerals tablet 1 tablet  1 tablet Oral Daily Kerry Hough, PA-C   1 tablet at 08/06/15 0758  . ondansetron (ZOFRAN-ODT) disintegrating tablet 4 mg  4 mg Oral Q6H PRN Kerry Hough, PA-C      . thiamine (B-1) injection 100 mg  100 mg Intramuscular Once Kerry Hough, PA-C   100 mg at 08/03/15 2330  . thiamine (VITAMIN B-1) tablet 100 mg  100 mg Oral Daily Kerry Hough, PA-C   100 mg at 08/06/15 0758  . traZODone (DESYREL) tablet 50 mg  50 mg Oral QHS,MR X 1 Kerry Hough, PA-C   50 mg at 08/05/15 2214  . zolpidem (AMBIEN) tablet 10 mg  10 mg Oral QHS PRN Rachael Fee, MD   10 mg at 08/05/15 2214    Lab Results: No results found for this or any previous visit (from the past 48 hour(s)).  Physical Findings: AIMS: Facial and Oral Movements Muscles of Facial Expression: None, normal Lips and Perioral Area: None, normal Jaw: None, normal Tongue: None, normal,Extremity Movements Upper (arms, wrists, hands, fingers): None, normal Lower (legs, knees, ankles, toes): None, normal, Trunk Movements Neck, shoulders, hips: None, normal, Overall Severity Severity of abnormal movements (highest score from questions above): None, normal Incapacitation due to abnormal movements: None, normal Patient's awareness of abnormal movements (rate only patient's report): No Awareness, Dental Status Current problems with teeth and/or dentures?: No Does patient usually wear dentures?: No  CIWA:  CIWA-Ar Total: 0 COWS:  COWS Total Score: 1  Musculoskeletal: Strength & Muscle Tone: within normal limits Gait & Station: normal Patient leans: normal  Psychiatric Specialty Exam: Review of Systems  Constitutional: Positive for malaise/fatigue.  HENT: Negative.   Eyes: Negative.   Respiratory: Negative.   Cardiovascular: Negative.   Gastrointestinal: Negative.   Genitourinary: Negative.   Musculoskeletal: Negative.    Skin: Negative.   Neurological: Positive for weakness.  Endo/Heme/Allergies: Negative.   Psychiatric/Behavioral: Positive for depression, suicidal ideas and substance abuse. The patient is nervous/anxious.   All other systems reviewed and are negative.   Blood pressure 126/65, pulse 56, temperature 97.6 F (36.4 C), temperature source Oral, resp. rate 16, height  (1.803 m), weight 77.111 kg (170 lb).Body mass index is 23.72 kg/(m^2).  General Appearance: Casual and Fairly Groomed  Eye Contact::  Minimal  Speech:  Clear and Coherent  Volume:  Decreased  Mood:  Dysphoric and Irritable  Affect:  Restricted  Thought Process:  Coherent and Goal Directed  Orientation:  Full (Time, Place, and Person)  Thought Content:  symptoms events worries concerns  Suicidal Thoughts:  Yes.  without intent/plan  Homicidal Thoughts:  No  Memory:  Immediate;   Fair Recent;   Fair Remote;   Fair  Judgement:  Fair  Insight:  Present  Psychomotor Activity:  Restlessness  Concentration:  Fair  Recall:  [employer] of Knowledge:Fair  Language: Fair  Akathisia:  No  Handed:  Right  AIMS (if indicated):     Assets:  Resilience Talents/Skills  ADL's:  Intact  Cognition: WNL  Sleep:  Number of Hours: 6   Treatment Plan Summary: Daily contact with patient to assess and evaluate symptoms and progress in treatment and Medication management -Supportive approach/coping skills -Alcohol cocaine benzo abuse; continue the Ativan detox protocol/work a relapse prevention plan -PTSD; continue the Prazosin 1 mg HS -Mood instability; will reassess for a mood stabilizer. Has used -Depakote, Tegretol but cant say if they were effective or not. -Will increase Lamictal to  bid -Continue Ambien  nightly -Discontinue Trazodone (redundant) -Will consider adding Abilify  -Use CBT/mindfulness   Beau Fanny, FNP-BC 08/06/2015, 12:22PM

## 2015-08-06 NOTE — Progress Notes (Signed)
D: Pt passive SI-contracts for safety.  denies HI/AVH. Pt is pessimistic, depressed and very flat. Pt interacts minimally on the unit. Pt has no positive outlook into his Tx of life. Pt continues to have negative and sometimes sarcastic tone when talking with Clinical research associate.   A: Pt was offered support and encouragement. Pt was given scheduled medications. Pt was encourage to attend groups. Q 15 minute checks were done for safety.   R:. Pt is taking medication.Pt receptive to treatment and safety maintained on unit.

## 2015-08-06 NOTE — Progress Notes (Signed)
Psychoeducational Group Note  Date:  08/06/2015 Time:  2207  Group Topic/Focus:  Wrap-Up Group:   The focus of this group is to help patients review their daily goal of treatment and discuss progress on daily workbooks.  Participation Level: Did Not Attend  Participation Quality:  Not Applicable  Affect:  Not Applicable  Cognitive:  Not Applicable  Insight:  Not Applicable  Engagement in Group: Not Applicable  Additional Comments:  The patient did not attend group this evening despite being encouraged to do so.   Quante Pettry S 08/06/2015, 10:07 PM

## 2015-08-06 NOTE — [facility] Group Notes (Signed)
[facility] Group Notes: (Clinical Social Work)   08/06/2015      Type of Therapy:  Group Therapy   Participation Level:  Did Not Attend despite MHT prompting   Tung Pustejovsky Grossman-Orr, LCSW 08/06/2015, 11:04 AM     

## 2015-08-06 NOTE — Plan of Care (Signed)
Problem: Alteration in mood Goal: LTG-Patient reports reduction in suicidal thoughts (Patient reports reduction in suicidal thoughts and is able to verbalize a safety plan for whenever patient is feeling suicidal)  Outcome: Not Progressing Pt SI-contracts for safety     

## 2015-08-07 DIAGNOSIS — F1020 Alcohol dependence, uncomplicated: Secondary | ICD-10-CM | POA: Diagnosis present

## 2015-08-07 MED ORDER — PRAZOSIN HCL 1 MG PO CAPS
2.0000 mg | ORAL_CAPSULE | Freq: Every day | ORAL | Status: DC
  Administered 2015-08-07 – 2015-08-12 (×6): 2 mg via ORAL
  Filled 2015-08-07 (×9): qty 2

## 2015-08-07 MED ORDER — HYDROXYZINE HCL 50 MG PO TABS
50.0000 mg | ORAL_TABLET | Freq: Every evening | ORAL | Status: DC | PRN
  Administered 2015-08-07 – 2015-08-20 (×13): 50 mg via ORAL
  Filled 2015-08-07 (×13): qty 1

## 2015-08-07 NOTE — [facility] Group Notes (Signed)
[facility] Group Notes:  (Nursing/MHT/Case Management/Adjunct)  Date:  08/07/2015  Time:  10:52 AM  Type of Therapy:  Psychoeducational Skills  Participation Level:  Did Not Attend  Participation Quality:  Did Not Attend  Affect:  Did Not Attend  Cognitive:  Did Not Attend  Insight:  None  Engagement in Group:  Did Not Attend  Modes of Intervention:  Did Not Attend  Summary of Progress/Problems: Pt did not attend patient self inventory group.   Jacquelyne Balint Shanta 08/07/2015, 10:52 AM

## 2015-08-07 NOTE — Progress Notes (Signed)
The patient attended this evening's A. A. Meeting and was appropriate.  

## 2015-08-07 NOTE — Plan of Care (Signed)
Problem: Alteration in mood Goal: LTG-Patient reports reduction in suicidal thoughts (Patient reports reduction in suicidal thoughts and is able to verbalize a safety plan for whenever patient is feeling suicidal)  Outcome: Progressing Pt denies SI at this time     

## 2015-08-07 NOTE — [facility] Group Notes (Signed)
[facility] Group Notes:  (Nursing/MHT/Case Management/Adjunct)  Date:  08/07/2015  Time:  11:05 AM  Type of Therapy:  Psychoeducational Skills  Participation Level:  Did Not Attend  Participation Quality:  Did Not Attend  Affect:  Did Not Attend  Cognitive:  Did Not Attend  Insight:  None  Engagement in Group:  Did Not Attend  Modes of Intervention:  Did Not Attend  Summary of Progress/Problems: Pt did not attend patient self inventory group.    Jacquelyne Balint Shanta 08/07/2015, 11:05 AM

## 2015-08-07 NOTE — Progress Notes (Signed)
Pt exhibiting drug seeking behaviors, pt just wants to take medications for no apparent reason, pt just wants whatever he can have.

## 2015-08-07 NOTE — [facility] Group Notes (Signed)
[facility] Group Notes:  (Nursing/MHT/Case Management/Adjunct)  Date:  08/07/2015  Time:  10:50 AM  Type of Therapy:  Psychoeducational Skills  Participation Level:  Active  Participation Quality:  Appropriate  Affect:  Appropriate  Cognitive:  Appropriate  Insight:  Appropriate  Engagement in Group:  Engaged  Modes of Intervention:  Discussion  Summary of Progress/Problems: Pt did attend self inventory group.   Jacquelyne Balint Shanta 08/07/2015, 10:50 AM

## 2015-08-07 NOTE — Progress Notes (Signed)
D. Pt present to the unit with flat affect and depressed mood to day. Pt stated he did not sleep well and was very tired this morning. Pt has been in his room most of the time, did not attend the group. Pt has been cooperative with medicaion Pt reported that his depression was a 6, his hopelessness was a 6, and that his anxiety was a 7. Pt reported being negative SI/HI, no AH/VH noted. A: 15 min checks continued for patient safety. R: Pts safety maintained.

## 2015-08-07 NOTE — [facility] Group Notes (Signed)
[facility] Group Notes:  (Clinical Social Work)  08/07/2015  10:00-11:00AM  Summary of Progress/Problems:   The main focus of today's process group was to   1)  discuss the importance of adding supports  2)  define health supports versus unhealthy supports  3)  identify the patient's current unhealthy supports and plan how to handle them  4)  Identify the patient's current healthy supports and plan what to add.  An emphasis was placed on using counselor, doctor, therapy groups, 12-step groups, and problem-specific support groups to expand supports.    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 He talked about AA groups as a possible help, but stated that he has a problem with the concept of a higher power, and that is why he has not been able to previously go to meetings or get a sponsor.  CSW talked about the availability of non-religious sobriety groups, but he did not express interest.    Type of Therapy:  Process Group with Motivational Interviewing  Participation Level:  Active  Participation Quality:  Attentive and Sharing  Affect:  Blunted and Irritable  Cognitive:  Oriented  Insight:  Resistant  Engagement in Therapy:  Improving  Modes of Intervention:   Education, Support and Processing, Activity  Ambrose Mantle, LCSW 08/07/2015

## 2015-08-07 NOTE — Tx Team (Signed)
Initial Interdisciplinary Treatment Plan   PATIENT STRESSORS: Medication change or noncompliance Substance abuse   PATIENT STRENGTHS: Communication skills General fund of knowledge   PROBLEM LIST: Problem List/Patient Goals Date to be addressed Date deferred Reason deferred Estimated date of resolution  SI 08/06/15     SA  08/06/15     ETOH 08/06/15     Depression 08/06/15     "nothing" 08/06/15                              DISCHARGE CRITERIA:  Ability to meet basic life and health needs Adequate post-discharge living arrangements Verbal commitment to aftercare and medication compliance  PRELIMINARY DISCHARGE PLAN: Attend aftercare/continuing care group Outpatient therapy  PATIENT/FAMIILY INVOLVEMENT: This treatment plan has been presented to and reviewed with the patient, Curtis Blankenship.  The patient and family have been given the opportunity to ask questions and make suggestions.  Jacques Navy A 08/07/2015, 6:01 AM

## 2015-08-07 NOTE — Progress Notes (Signed)
Pt came to med window "what else can you give me ". Pt was informed to describe what issues he was having. Pt stated he was having problems sleeping. Pt was informed he was given 10 Ambien and 25 vistaril. Pt stated " This candy shit is not going to help me". Pt was asked why he has not discussed this issue with the doctor since he has been here 4 days and has been un-happy. Pt stormed of from the med window "I'm used to this shit"

## 2015-08-07 NOTE — Progress Notes (Signed)
[facility] MD Progress Note  08/07/2015  Curtis Blankenship  MRN:  [id number] Subjective:  Pt states: "I feel slightly better. Just trying to get through the day if I can."   Objective: Pt seen and chart reviewed. Pt is alert/oriented x4, depressed, agitated, irritable, yet appropriate for the most part during the assessment. Pt reports that his suicidal ideation is improving and he has not felt that way today. Pt denies homicidal ideation. Denies psychosis and does not appear to be responding to internal stimuil. Pt is clearly very depressed and hopeless, reporting that he feels worthless and in a state of despair. However, he appears to have improved slightly today. He reports some improvement from the medication and feels perhaps the dosing is not high enough. Pt wants trazodone to be added to his Ambien but is receptive to increasing Minipress to 2mg  instead.   Principal Problem: Severe recurrent major depression without psychotic features (HCC) Diagnosis:   Patient Active Problem List   Diagnosis Date Noted  . Severe recurrent major depression without psychotic features (HCC) [F33.2] 08/04/2015    Priority: High  . Alcohol dependence (HCC) [F10.20] 12/01/2012    Priority: Medium  . Cocaine abuse [F14.10] 12/01/2012    Priority: Medium  . PTSD (post-traumatic stress disorder) [F43.10] 12/01/2012    Priority: Medium  . Uncomplicated alcohol dependence (HCC) [F10.20]   . Hyponatremia [E87.1] 08/02/2015  . Alcohol abuse [F10.10] 08/02/2015  . Suicidal ideation [R45.851] 08/02/2015  . Aspiration pneumonia (HCC) [J69.0] 08/02/2015  . Syncope [R55] 08/02/2015  . Blurred vision [H53.8]   . TIA (transient ischemic attack) [G45.9] 09/19/2013  . Disturbance of skin sensation [R20.9] 09/19/2013  . 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 Depressive disorder [F32.9] 12/01/2012   Total Time spent with patient: 15 minutes  Past Psychiatric History: See H&P  Past Medical History:  Past Medical History   Diagnosis Date  . Bipolar disorder (HCC)   . PTSD (post-traumatic stress disorder)   . Depression     Past Surgical History  Procedure Laterality Date  . Appendectomy     Family History:  Family History  Problem Relation Age of Onset  . Hypertension Mother   . Alcoholism Other    Family Psychiatric  History: see H and P Social History:  History  Alcohol Use  . 97.2 oz/week  . 150 Cans of beer, 12 Shots of liquor per week    Comment: he drinks every day as much as he can     History  Drug Use  . Yes  . Special: Cocaine, Other-see comments, Benzodiazepines    Social History   Social History  . Marital Status: Single    Spouse Name: N/A  . Number of Children: N/A  . Years of Education: N/A   Social History Main Topics  . Smoking status: Current Every Day Smoker -- 1.50 packs/day for 10 years    Types: Cigarettes  . Smokeless tobacco: Former Neurosurgeon    Types: Snuff  . Alcohol Use: 97.2 oz/week    150 Cans of beer, 12 Shots of liquor per week     Comment: he drinks every day as much as he can  . Drug Use: Yes    Special: Cocaine, Other-see comments, Benzodiazepines  . Sexual Activity: Not Asked   Other Topics Concern  . None   Social History Narrative   Additional Social History:  Sleep: Fair  Appetite:  Poor  Current Medications: Current Facility-Administered Medications  Medication Dose Route Frequency Provider Last Rate Last Dose  . alum & mag hydroxide-simeth (MAALOX/MYLANTA) 200-200-20 MG/5ML suspension 30 mL  30 mL Oral Q4H PRN Nelly Rout, MD      . lamoTRIgine (LAMICTAL) tablet 25 mg  25 mg Oral BID Beau Fanny, FNP   25 mg at 08/07/15 0755  . magnesium hydroxide (MILK OF MAGNESIA) suspension 30 mL  30 mL Oral Daily PRN Nelly Rout, MD      . multivitamin with minerals tablet 1 tablet  1 tablet Oral Daily Kerry Hough, PA-C   1 tablet at 08/07/15 0755  . prazosin (MINIPRESS) capsule 1 mg  1 mg Oral QHS  Beau Fanny, FNP   1 mg at 08/06/15 2156  . thiamine (B-1) injection 100 mg  100 mg Intramuscular Once Kerry Hough, PA-C   100 mg at 08/03/15 2330  . thiamine (VITAMIN B-1) tablet 100 mg  100 mg Oral Daily Kerry Hough, PA-C   100 mg at 08/07/15 1610  . zolpidem (AMBIEN) tablet 10 mg  10 mg Oral QHS PRN Rachael Fee, MD   10 mg at 08/06/15 2156    Lab Results: No results found for this or any previous visit (from the past 48 hour(s)).  Physical Findings: AIMS: Facial and Oral Movements Muscles of Facial Expression: None, normal Lips and Perioral Area: None, normal Jaw: None, normal Tongue: None, normal,Extremity Movements Upper (arms, wrists, hands, fingers): None, normal Lower (legs, knees, ankles, toes): None, normal, Trunk Movements Neck, shoulders, hips: None, normal, Overall Severity Severity of abnormal movements (highest score from questions above): None, normal Incapacitation due to abnormal movements: None, normal Patient's awareness of abnormal movements (rate only patient's report): No Awareness, Dental Status Current problems with teeth and/or dentures?: No Does patient usually wear dentures?: No  CIWA:  CIWA-Ar Total: 3 COWS:  COWS Total Score: 1  Musculoskeletal: Strength & Muscle Tone: within normal limits Gait & Station: normal Patient leans: normal  Psychiatric Specialty Exam: Review of Systems  Constitutional: Positive for malaise/fatigue.  HENT: Negative.   Eyes: Negative.   Respiratory: Negative.   Cardiovascular: Negative.   Gastrointestinal: Negative.   Genitourinary: Negative.   Musculoskeletal: Negative.   Skin: Negative.   Neurological: Positive for weakness.  Endo/Heme/Allergies: Negative.   Psychiatric/Behavioral: Positive for depression and substance abuse. Negative for suicidal ideas. The patient is nervous/anxious.   All other systems reviewed and are negative.   Blood pressure 110/61, pulse 75, temperature 97.6 F (36.4 C),  temperature source Oral, resp. rate 16, height  (1.803 m), weight 77.111 kg (170 lb).Body mass index is 23.72 kg/(m^2).  General Appearance: Casual and Fairly Groomed  Patent attorney::  Minimal  Speech:  Clear and Coherent  Volume:  Decreased  Mood:  Dysphoric and Irritable  Affect:  Restricted  Thought Process:  Coherent and Goal Directed  Orientation:  Full (Time, Place, and Person)  Thought Content:  symptoms events worries concerns  Suicidal Thoughts:  No  Homicidal Thoughts:  No  Memory:  Immediate;   Fair Recent;   Fair Remote;   Fair  Judgement:  Fair  Insight:  Present  Psychomotor Activity:  Restlessness  Concentration:  Fair  Recall:  [employer] of Knowledge:Fair  Language: Fair  Akathisia:  No  Handed:  Right  AIMS (if indicated):     Assets:  Resilience Talents/Skills  ADL's:  Intact  Cognition:  WNL  Sleep:  Number of Hours: 5.5   Treatment Plan Summary: Daily contact with patient to assess and evaluate symptoms and progress in treatment and Medication management -Supportive approach/coping skills -Alcohol cocaine benzo abuse; continue the Ativan detox protocol/work a relapse prevention plan -PTSD; increase the the Prazosin to  qhs -Mood instability; will reassess for a mood stabilizer. Has used -Depakote, Tegretol but cant say if they were effective or not. -Will increase Lamictal to  bid -Continue Ambien  nightly -Use CBT/mindfulness  -Add Vistaril  qhs prn for racing thoughts  Beau Fanny, FNP-BC 08/07/2015, 12:22PM

## 2015-08-07 NOTE — Progress Notes (Signed)
D: Pt denies SI/HI/AVH. Pt avoids writer, pt forwards little and does not want to communicate. Pt observed in dayroom interacting with peers.   A: Pt was offered support and encouragement. Pt was given scheduled medications. Pt was encourage to attend groups. Q 15 minute checks were done for safety.   R:Pt attends groups and interacts well with peers and staff. Pt is taking medication. Pt receptive to treatment and safety maintained on unit.

## 2015-08-08 NOTE — Progress Notes (Signed)
[facility] MD Progress Note  08/08/2015 6:29 PM Jerett Odonohue  MRN:  [id number] Subjective:  Curtis Blankenship continues to have a hard time. He states he does not see how he can get out of this one. Admits to persistent depression, a sense of hopelessness helplessness. He feels he has no sense of purpose. He has not contacted his mother as does not want to put this on her. States he cant fall asleep as he goes to bed and ruminates and worries Principal Problem: Severe recurrent major depression without psychotic features (HCC) Diagnosis:   Patient Active Problem List   Diagnosis Date Noted  . Uncomplicated alcohol dependence (HCC) [F10.20]   . Severe recurrent major depression without psychotic features (HCC) [F33.2] 08/04/2015  . Hyponatremia [E87.1] 08/02/2015  . Alcohol abuse [F10.10] 08/02/2015  . Suicidal ideation [R45.851] 08/02/2015  . Aspiration pneumonia (HCC) [J69.0] 08/02/2015  . Syncope [R55] 08/02/2015  . Blurred vision [H53.8]   . TIA (transient ischemic attack) [G45.9] 09/19/2013  . Disturbance of skin sensation [R20.9] 09/19/2013  . 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 Alcohol dependence (HCC) [F10.20] 12/01/2012  . Cocaine abuse [F14.10] 12/01/2012  . PTSD (post-traumatic stress disorder) [F43.10] 12/01/2012  . Depressive disorder [F32.9] 12/01/2012   Total Time spent with patient: 30 minutes  Past Psychiatric History: see Admission H and P  Past Medical History:  Past Medical History  Diagnosis Date  . Bipolar disorder (HCC)   . PTSD (post-traumatic stress disorder)   . Depression     Past Surgical History  Procedure Laterality Date  . Appendectomy     Family History:  Family History  Problem Relation Age of Onset  . Hypertension Mother   . Alcoholism Other    Family Psychiatric  History: see Admission H and P Social History:  History  Alcohol Use  . 97.2 oz/week  . 150 Cans of beer, 12 Shots of liquor per week    Comment: he drinks every day as much as he can    History  Drug Use  . Yes  . Special: Cocaine, Other-see comments, Benzodiazepines    Social History   Social History  . Marital Status: Single    Spouse Name: N/A  . Number of Children: N/A  . Years of Education: N/A   Social History Main Topics  . Smoking status: Current Every Day Smoker -- 1.50 packs/day for 10 years    Types: Cigarettes  . Smokeless tobacco: Former Neurosurgeon    Types: Snuff  . Alcohol Use: 97.2 oz/week    150 Cans of beer, 12 Shots of liquor per week     Comment: he drinks every day as much as he can  . Drug Use: Yes    Special: Cocaine, Other-see comments, Benzodiazepines  . Sexual Activity: Not Asked   Other Topics Concern  . None   Social History Narrative   Additional Social History:                         Sleep: Poor  Appetite:  Poor  Current Medications: Current Facility-Administered Medications  Medication Dose Route Frequency Provider Last Rate Last Dose  . alum & mag hydroxide-simeth (MAALOX/MYLANTA) 200-200-20 MG/5ML suspension 30 mL  30 mL Oral Q4H PRN Nelly Rout, MD      . hydrOXYzine (ATARAX/VISTARIL) tablet 50 mg  50 mg Oral QHS PRN Beau Fanny, FNP   50 mg at 08/07/15 2225  . lamoTRIgine (LAMICTAL) tablet 25 mg  25 mg  Oral BID Beau Fanny, FNP   25 mg at 08/08/15 1807  . magnesium hydroxide (MILK OF MAGNESIA) suspension 30 mL  30 mL Oral Daily PRN Nelly Rout, MD      . multivitamin with minerals tablet 1 tablet  1 tablet Oral Daily Kerry Hough, PA-C   1 tablet at 08/08/15 0758  . prazosin (MINIPRESS) capsule 2 mg  2 mg Oral QHS Beau Fanny, FNP   2 mg at 08/07/15 2224  . thiamine (B-1) injection 100 mg  100 mg Intramuscular Once Kerry Hough, PA-C   100 mg at 08/03/15 2330  . thiamine (VITAMIN B-1) tablet 100 mg  100 mg Oral Daily Kerry Hough, PA-C   100 mg at 08/08/15 1610  . zolpidem (AMBIEN) tablet 10 mg  10 mg Oral QHS PRN Rachael Fee, MD   10 mg at 08/07/15 2225    Lab Results: No results  found for this or any previous visit (from the past 48 hour(s)).  Physical Findings: AIMS: Facial and Oral Movements Muscles of Facial Expression: None, normal Lips and Perioral Area: None, normal Jaw: None, normal Tongue: None, normal,Extremity Movements Upper (arms, wrists, hands, fingers): None, normal Lower (legs, knees, ankles, toes): None, normal, Trunk Movements Neck, shoulders, hips: None, normal, Overall Severity Severity of abnormal movements (highest score from questions above): None, normal Incapacitation due to abnormal movements: None, normal Patient's awareness of abnormal movements (rate only patient's report): No Awareness, Dental Status Current problems with teeth and/or dentures?: No Does patient usually wear dentures?: No  CIWA:  CIWA-Ar Total: 0 COWS:  COWS Total Score: 1  Musculoskeletal: Strength & Muscle Tone: within normal limits Gait & Station: normal Patient leans: normal  Psychiatric Specialty Exam: Review of Systems  Constitutional: Positive for malaise/fatigue.  HENT: Negative.   Eyes: Negative.   Respiratory: Negative.   Cardiovascular: Negative.   Gastrointestinal: Negative.   Genitourinary: Negative.   Musculoskeletal: Negative.   Skin: Negative.   Neurological: Positive for weakness.  Endo/Heme/Allergies: Negative.   Psychiatric/Behavioral: Positive for depression, suicidal ideas and substance abuse. The patient is nervous/anxious and has insomnia.     Blood pressure 129/90, pulse 89, temperature 98.5 F (36.9 C), temperature source Oral, resp. rate 18, height  (1.803 m), weight 77.111 kg (170 lb).Body mass index is 23.72 kg/(m^2).  General Appearance: Fairly Groomed  Patent attorney::  Fair  Speech:  Clear and Coherent  Volume:  fluctuates  Mood:  Anxious, Depressed, Dysphoric and Hopeless  Affect:  Restricted  Thought Process:  Coherent and Goal Directed  Orientation:  Full (Time, Place, and Person)  Thought Content:  symptoms  events worries concerns  Suicidal Thoughts:  Yes.  without intent/plan  Homicidal Thoughts:  No  Memory:  Immediate;   Fair Recent;   Fair Remote;   Fair  Judgement:  Fair  Insight:  Present and Shallow  Psychomotor Activity:  Restlessness  Concentration:  Fair  Recall:  [employer] of Knowledge:Fair  Language: Fair  Akathisia:  No  Handed:  Right  AIMS (if indicated):     Assets:  Desire for Improvement  ADL's:  Intact  Cognition: WNL  Sleep:  Number of Hours: 5   Treatment Plan Summary: Daily contact with patient to assess and evaluate symptoms and progress in treatment and Medication management Supportive approach/coping skills Alcohol dependence continue the detox protocol/work a relapse prevention plan Mood instability; continue the Lamictal, plan to increase the dose in the next couple of  days PTSD; continue the Minipress 2 mg HS Insomnia; will continue the Ambien but will consider an atypical given the ruminative thinking that does not let him fall asleep Work with CBT/mindfulness Kamar Callender A 08/08/2015, 6:29 PM

## 2015-08-08 NOTE — Progress Notes (Signed)
Patient ID: Curtis Blankenship, male   DOB: [birth year], 34 y.o.   MRN: [id number] PER STATE REGULATIONS 482.30  THIS CHART WAS REVIEWED FOR MEDICAL NECESSITY WITH RESPECT TO THE PATIENT'S ADMISSION/ DURATION OF STAY.  NEXT REVIEW DATE:   08/11/2015  Willa Rough, RN, BSN CASE MANAGER

## 2015-08-08 NOTE — BHH Group Notes (Signed)
[facility] LCSW Aftercare Discharge Planning Group Note   08/08/2015 9:13 AM  Participation Quality:  DID NOT ATTEND. Invited. Chose to remain in bed.   Smart, [employer]

## 2015-08-08 NOTE — Progress Notes (Signed)
D: Pt denies SI/HI/AVH. Pt is pleasant and cooperative. Pt sits in dayroom, pt avoids Clinical research associate and does not want to talk when engaged.   A: Pt was offered support and encouragement. Pt was given scheduled medications. Pt was encourage to attend groups. Q 15 minute checks were done for safety.   R:Pt attends groups . Pt is taking medication.Pt receptive to treatment and safety maintained on unit.

## 2015-08-08 NOTE — Progress Notes (Signed)
DAR Note: Patient presents with anxious affect with depressed mood.  Patient up and sitting in the dayroom.  Denies pain, auditory and visual hallucinations.  Patient preoccupied with discharge date.  Medications given as prescribed.  Maintained on routine safety checks per protocol.  Support and encouragement offered as needed.

## 2015-08-08 NOTE — Clinical Social Work Note (Signed)
CSW left voicemail for [employer] (medical support/admissions) for [facility] substance [facility] treatment program in attempt to make referral for pt. CSW requested call back at [facility] earliest convenience. (Office is closed today due to holiday).  Trula Slade, LCSWA Clinical Social Worker 08/08/2015 2:34 PM

## 2015-08-08 NOTE — BHH Group Notes (Signed)
[facility] LCSW Group Therapy  08/08/2015 12:54 PM  Type of Therapy:  Group Therapy  Participation Level:  Active  Participation Quality:  Attentive  Affect:  Depressed and Flat  Cognitive:  Oriented  Insight:  Limited  Engagement in Therapy:  Improving  Modes of Intervention:  Confrontation, Discussion, Education, Exploration, Problem-solving, Rapport Building, Socialization and Support  Summary of Progress/Problems: Today's Topic: Overcoming Obstacles. Patients identified one short term goal and potential obstacles in reaching this goal. Patients processed barriers involved in overcoming these obstacles. Patients identified steps necessary for overcoming these obstacles and explored motivation (internal and external) for facing these difficulties head on. Curtis Blankenship "Curtis Blankenship" was attentive and engaged during today's processing group. He shared that his biggest obstacle is getting himself 'mentally stable' and staying away from drugs and alcohol. "I tried to kill myself by overdosing." He is interested in going to [facility] for Substance Abuse treatment and eventually plans to fly to [place] to live. Pt reports that "I burned all bridges with my family." Curtis Blankenship shows improving insight aeb his ability to acknowledge his need for continued treatment rather than "just taking off to [place]." He continues to show progress in the group setting.   Smart, Arsenia Goracke LCSWA  08/08/2015, 12:54 PM

## 2015-08-08 NOTE — Plan of Care (Signed)
Problem: Alteration in mood Goal: LTG-Patient reports reduction in suicidal thoughts (Patient reports reduction in suicidal thoughts and is able to verbalize a safety plan for whenever patient is feeling suicidal)  Outcome: Progressing Denies SI at this time     

## 2015-08-08 NOTE — Progress Notes (Signed)
Adult Psychoeducational Group Note  Date:  08/08/2015 Time:  9:35 PM  Group Topic/Focus:  Wrap-Up Group:   The focus of this group is to help patients review their daily goal of treatment and discuss progress on daily workbooks.  Participation Level:  Minimal  Participation Quality:  Redirectable  Affect:  Flat  Cognitive:  Oriented  Insight: Limited  Engagement in Group:  Engaged  Modes of Intervention:  Limit-setting  Additional Comments:  Patient shared he doesn't have any goals except for trying to get to [facility] treatment.  Curtis Blankenship 08/08/2015, 9:35 PM

## 2015-08-09 MED ORDER — BUPROPION HCL ER (XL) 150 MG PO TB24
150.0000 mg | ORAL_TABLET | Freq: Every day | ORAL | Status: DC
  Administered 2015-08-10 – 2015-08-14 (×5): 150 mg via ORAL
  Filled 2015-08-09 (×8): qty 1

## 2015-08-09 NOTE — Progress Notes (Signed)
Pt attended AA meeting and was appropriate.

## 2015-08-09 NOTE — Progress Notes (Signed)
Recreation Therapy Notes  Animal-Assisted Activity (AAA) Program Checklist/Progress Notes Patient Eligibility Criteria Checklist & Daily Group note for Rec Tx Intervention  Date: 10.11.2016 Time: 2:45pm Location: 400 Hall Dayroom    AAA/T Program Assumption of Risk Form signed by Patient/ or Parent Legal Guardian yes  Patient is free of allergies or sever asthma yes  Patient reports no fear of animals yes  Patient reports no history of cruelty to animals yes  Patient understands his/her participation is voluntary yes  Behavioral Response:Did not attend.   Curtis Blankenship, LRT/CTRS        Curtis Blankenship L 08/09/2015 3:05 PM 

## 2015-08-09 NOTE — BHH Group Notes (Signed)
B[facility] Group Notes:  (Nursing/MHT/Case Management/Adjunct)  Date:  08/09/2015  Time:  2:19 PM  Type of Therapy:  Nurse Education  Participation Level:  Did Not Attend  Participation Quality:    Affect:    Cognitive:    Insight:    Engagement in Group:    Modes of Intervention:  Discussion and Education  Summary of Progress/Problems:  Group topic was Recovery.  Discussed setting appropriate/measurable goals.  Jovonni did not attend group after much encouragement.     Norm Parcel Josealfredo Adkins 08/09/2015, 2:19 PM

## 2015-08-09 NOTE — Tx Team (Signed)
Interdisciplinary Treatment Plan Update (Adult)  Date:  08/09/2015  Time Reviewed:  8:26 AM   Progress in Treatment: Attending groups:  Intermittently  Participating in groups:  Yes, when he attends  Taking medication as prescribed:  Yes. Tolerating medication:  Yes. Family/Significant othe contact made:  SPE completed with pt, as he refused contact with family.  Patient understands diagnosis:  Yes. and As evidenced by:  seeking treatment for SI with overdose of cocaine, alcohol abuse, depression, and medication stabilization. Discussing patient identified problems/goals with staff:  Yes. Medical problems stabilized or resolved:  Yes. Denies suicidal/homicidal ideation: Yes. Self report.  Issues/concerns per patient self-inventory:  Other:  Discharge Plan or Barriers: Pt is interested in [place] and is aware that he may not get in directly from [facility]. CSW left message (national holiday mon) on Monday for director of substance abuse program to inquire about referral process and waitlist. Pt has hx at [facility] for med management.   Update: Pt not able to be considered for inpatient [facility] placement due to # of recent previous i/p admissions and his lack of outpatient follow-up per Bethena Roys Buckles [facility] Investment banker, operational). [phone] ext [phone]. Pt absorbed news appropriately this afternoon but was not willing to talk about other options at this time. CSW assessing.  08/09/2015 3:57 PM    Reason for Continuation of Hospitalization: Depression Medication stabilization  Comments:  Curtis Blankenship is a 34 y.o. male with history of alcohol and cocaine abuse presented to the ER because of suicidal ideation. Patient states he has been drinking alcohol everyday and last evening had taken extra dose of cocaine with intention of suicide. Patient states that he passed out following the last dose of cocaine. When he woke up he started developing chest pressure which lasted  for 1 hour and resolved. Patient has been having some left-sided blurred vision since then. Patient hired a cab and came to the ER. Prior to which patient also had one episode of nausea vomiting. In the ER patient's labs revealed positive cocaine with mild hyponatremia and chest x-ray showing possible infiltrate. Patient was placed on suicide precaution and admitted for further management.  Denies any shortness of breath and chest pain at this time is resolved. Still complains of some left-sided blurred vision.  Estimated length of stay:  2-4 days   New goal(s): to develop effective after care plan.   Additional Comments:  Patient and CSW reviewed pt's identified goals and treatment plan. Patient verbalized understanding and agreed to treatment plan. CSW reviewed [facility] "Discharge Process and Patient Involvement" Form. Pt verbalized understanding of information provided and signed form.    Review of initial/current patient goals per problem list:  1. Goal(s): Patient will participate in aftercare plan  Met: No.   Target date: at discharge  As evidenced by: Patient will participate within aftercare plan AEB aftercare provider and housing plan at discharge being identified.  10/6: CSW assessing for appropriate referrals.   10/11: Pt requesting referral to [place] program CSW assessing.   2. Goal (s): Patient will exhibit decreased depressive symptoms and suicidal ideations.  Met: No.    Target date: at discharge  As evidenced by: Patient will utilize self rating of depression at 3 or below and demonstrate decreased signs of depression or be deemed stable for discharge by MD.  10/6: Pt rates depression as high to day. Denies SI/HI/AVH.   10/11: Pt continues to rate depression as high today with no SI.  3. Goal(s): Patient will demonstrate decreased signs of withdrawal due to substance abuse  Met:Yes   Target date:at discharge   As evidenced by: Patient will  produce a CIWA/COWS score of 0, have stable vitals signs, and no symptoms of withdrawal.    10/6: Pt reports moderate withdrawals with CIWA score of 11 and high sitting BP.    10/11: Pt reports no signs of withdrawal with CIWA/COWS of 0 and stable vitals.   Attendees: Patient:   08/09/2015 8:26 AM   Family:   08/09/2015 8:26 AM   Physician:  Dr. Carlton Adam, MD 08/09/2015 8:26 AM   Nursing:   Tawni Levy RN 08/09/2015 8:26 AM   Clinical Social Worker: Maxie Better, [place]  08/09/2015 8:26 AM   Clinical Social Worker: Erasmo Downer Drinkard LCSWA  08/09/2015 8:26 AM   Other:  Gerline Legacy Nurse Case Manager 08/09/2015 8:26 AM   Other:  Lucinda Dell; [facility]  08/09/2015 8:26 AM   Other:   08/09/2015 8:26 AM   Other:  08/09/2015 8:26 AM   Other:  08/09/2015 8:26 AM   Other:  08/09/2015 8:26 AM    08/09/2015 8:26 AM    08/09/2015 8:26 AM    08/09/2015 8:26 AM    08/09/2015 8:26 AM    Scribe for Treatment Team:   Maxie Better, [place]  08/09/2015 8:26 AM

## 2015-08-09 NOTE — BHH Group Notes (Signed)
[facility] LCSW Group Therapy  08/09/2015 1:23 PM  Type of Therapy:  Group Therapy  Participation Level:  Did Not Attend-pt invited. Chose to remain in bed.  Modes of Intervention:  Discussion, Education, Exploration, Problem-solving, Rapport Building, Socialization and Support  Summary of Progress/Problems: MHA Speaker came to talk about his personal journey with substance abuse and addiction. The pt processed ways by which to relate to the speaker. MHA speaker provided handouts and educational information pertaining to groups and services offered by the [facility].   Blankenship, Curtis Darko LCSWA 08/09/2015, 1:23 PM

## 2015-08-09 NOTE — Clinical Social Work Note (Signed)
CSW spoke with Letitia Libra Abuse program director at the [facility]. She stated that pt was in that program in 2015 and in [place] in early 2016, with no outpatient follow-up. Therefore, she is unable to consider him for any [facility] inpatient services for substance abuse at this time. CSW shared news with pt. He stated "when it rains it pours." Pt politely said, Thank you and layed in bed. He did not want to discuss other options at that time and was not receptive to hearing about [place]/halfway houses in the [place] area. CSW assessing.  Trula Slade, LCSWA Clinical Social Worker 08/09/2015 3:59 PM

## 2015-08-09 NOTE — Progress Notes (Signed)
Patient has spent the majority of the day in bed, minimal interactions with staff and other patients. 15 min. Checks maintained throughout the shift. Patient has been calm and polite, taking all scheduled medications. Patient has denied pain or discomfort throughout the day.  Asher Muir Tomas Schamp,RN

## 2015-08-09 NOTE — Progress Notes (Signed)
[facility] MD Progress Note  08/09/2015 6:57 PM Curtis Blankenship  MRN:  [id number] Subjective:  Curtis Blankenship continues to endorse severe depression a sense of hopelessness helplessness. He is not going to be able to go back to the [place]. States that they probably think he is a "habitual f--- up." states he does not blame them. He has not heard from his mother. Tomorrow is his birthday and states if he does not hear from her then he is going to be really upset. Admits to a lot of shame and guilt. States he has never been this depressed before. Although hesitant to start an antidepressant, he states he cant take this depression anymore. He states he is just wanting to give up, to die Principal Problem: Severe recurrent major depression without psychotic features [facility]) Diagnosis:   Patient Active Problem List   Diagnosis Date Noted  . Uncomplicated alcohol dependence (HCC) [F10.20]   . Severe recurrent major depression without psychotic features (HCC) [F33.2] 08/04/2015  . Hyponatremia [E87.1] 08/02/2015  . Alcohol abuse [F10.10] 08/02/2015  . Suicidal ideation [R45.851] 08/02/2015  . Aspiration pneumonia (HCC) [J69.0] 08/02/2015  . Syncope [R55] 08/02/2015  . Blurred vision [H53.8]   . TIA (transient ischemic attack) [G45.9] 09/19/2013  . Disturbance of skin sensation [R20.9] 09/19/2013  . 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 Alcohol dependence (HCC) [F10.20] 12/01/2012  . Cocaine abuse [F14.10] 12/01/2012  . PTSD (post-traumatic stress disorder) [F43.10] 12/01/2012  . Depressive disorder [F32.9] 12/01/2012   Total Time spent with patient: 30 minutes  Past Psychiatric History: see Admission H and P  Past Medical History:  Past Medical History  Diagnosis Date  . Bipolar disorder (HCC)   . PTSD (post-traumatic stress disorder)   . Depression     Past Surgical History  Procedure Laterality Date  . Appendectomy     Family History:  Family History  Problem Relation Age of Onset  .  Hypertension Mother   . Alcoholism Other    Family Psychiatric  History: see admission H and P Social History:  History  Alcohol Use  . 97.2 oz/week  . 150 Cans of beer, 12 Shots of liquor per week    Comment: he drinks every day as much as he can     History  Drug Use  . Yes  . Special: Cocaine, Other-see comments, Benzodiazepines    Social History   Social History  . Marital Status: Single    Spouse Name: N/A  . Number of Children: N/A  . Years of Education: N/A   Social History Main Topics  . Smoking status: Current Every Day Smoker -- 1.50 packs/day for 10 years    Types: Cigarettes  . Smokeless tobacco: Former Neurosurgeon    Types: Snuff  . Alcohol Use: 97.2 oz/week    150 Cans of beer, 12 Shots of liquor per week     Comment: he drinks every day as much as he can  . Drug Use: Yes    Special: Cocaine, Other-see comments, Benzodiazepines  . Sexual Activity: Not Asked   Other Topics Concern  . None   Social History Narrative   Additional Social History:                         Sleep: Poor  Appetite:  Poor  Current Medications: Current Facility-Administered Medications  Medication Dose Route Frequency Provider Last Rate Last Dose  . alum & mag hydroxide-simeth (MAALOX/MYLANTA) 200-200-20 MG/5ML suspension 30 mL  30 mL Oral Q4H PRN Nelly Rout, MD      . Melene Muller ON 08/10/2015] buPROPion (WELLBUTRIN XL) 24 hr tablet 150 mg  150 mg Oral Daily Rachael Fee, MD      . hydrOXYzine (ATARAX/VISTARIL) tablet 50 mg  50 mg Oral QHS PRN Beau Fanny, FNP   50 mg at 08/08/15 2310  . lamoTRIgine (LAMICTAL) tablet 25 mg  25 mg Oral BID Beau Fanny, FNP   25 mg at 08/09/15 1822  . magnesium hydroxide (MILK OF MAGNESIA) suspension 30 mL  30 mL Oral Daily PRN Nelly Rout, MD      . multivitamin with minerals tablet 1 tablet  1 tablet Oral Daily Kerry Hough, PA-C   1 tablet at 08/09/15 0901  . prazosin (MINIPRESS) capsule 2 mg  2 mg Oral QHS Beau Fanny, FNP    2 mg at 08/08/15 2310  . thiamine (B-1) injection 100 mg  100 mg Intramuscular Once Kerry Hough, PA-C   100 mg at 08/03/15 2330  . thiamine (VITAMIN B-1) tablet 100 mg  100 mg Oral Daily Kerry Hough, PA-C   100 mg at 08/09/15 0901  . zolpidem (AMBIEN) tablet 10 mg  10 mg Oral QHS PRN Rachael Fee, MD   10 mg at 08/08/15 2310    Lab Results: No results found for this or any previous visit (from the past 48 hour(s)).  Physical Findings: AIMS: Facial and Oral Movements Muscles of Facial Expression: None, normal Lips and Perioral Area: None, normal Jaw: None, normal Tongue: None, normal,Extremity Movements Upper (arms, wrists, hands, fingers): None, normal Lower (legs, knees, ankles, toes): None, normal, Trunk Movements Neck, shoulders, hips: None, normal, Overall Severity Severity of abnormal movements (highest score from questions above): None, normal Incapacitation due to abnormal movements: None, normal Patient's awareness of abnormal movements (rate only patient's report): No Awareness, Dental Status Current problems with teeth and/or dentures?: No Does patient usually wear dentures?: No  CIWA:  CIWA-Ar Total: 0 COWS:  COWS Total Score: 1  Musculoskeletal: Strength & Muscle Tone: within normal limits Gait & Station: normal Patient leans: normal  Psychiatric Specialty Exam: Review of Systems  Constitutional: Negative.   HENT: Negative.   Eyes: Negative.   Respiratory: Negative.   Cardiovascular: Negative.   Gastrointestinal: Negative.   Genitourinary: Negative.   Musculoskeletal: Negative.   Skin: Negative.   Neurological: Negative.   Endo/Heme/Allergies: Negative.   Psychiatric/Behavioral: Positive for depression, suicidal ideas and substance abuse. The patient is nervous/anxious.     Blood pressure 100/60, pulse 63, temperature 97.7 F (36.5 C), temperature source Oral, resp. rate 18, height  (1.803 m), weight 77.111 kg (170 lb).Body mass index is  23.72 kg/(m^2).  General Appearance: Disheveled  Eye Solicitor::  Fair  Speech:  Clear and Coherent  Volume:  fluctuates  Mood:  Anxious, Depressed, Dysphoric, Hopeless and Worthless  Affect:  Depressed and anxious   Thought Process:  Coherent and Goal Directed  Orientation:  Full (Time, Place, and Person)  Thought Content:  symptoms events worries concerns  Suicidal Thoughts:  Yes.  without intent/plan able to contract for safety  Homicidal Thoughts:  No  Memory:  Immediate;   Fair Recent;   Fair Remote;   Fair  Judgement:  Fair  Insight:  Present  Psychomotor Activity:  Restlessness  Concentration:  Fair  Recall:  [employer] of Knowledge:Fair  Language: Fair  Akathisia:  No  Handed:  Right  AIMS (if  indicated):     Assets:  Desire for Improvement  ADL's:  Intact  Cognition: WNL  Sleep:  Number of Hours: 5.75   Treatment Plan Summary: Daily contact with patient to assess and evaluate symptoms and progress in treatment and Medication management Supportive approach/coping skills Alcohol cocaine dependence/continue to work a relapse prevention plan Depression; will start Wellbutrin XL 150 mg in AM given his symptoms of no energy no motivation etc. Mood instability; will pursue the Lamictal 25 mg BID further Monitor the suicidal ideas, he is still willing to contract for safety Explore other residential treatment options trough the [place] or otherwise CBT/mindfulness Genna Casimir A 08/09/2015, 6:57 PM

## 2015-08-10 NOTE — [facility] Group Notes (Signed)
[facility]   08/10/2015 10:34 AM  Participation Quality:  Invited/chose not to attend. Pt remains in bed this morning.   Smart, American FinancialHeather LCSWA

## 2015-08-10 NOTE — Progress Notes (Signed)
D. Pt present with depressed and impulsive mood to the unit. Pt appears angry and get annoyed so easily if his need are not met immediately. Pt has been isolative and keep to his room most of the time. Pt stated he did not have good night sleep, has low energy and poor concentration. Pt denied physical pain, took his medications as scheduled. Pt's goal is to "find my next destination."  Pt reported that his depression was a 5, his hopelessness was a 5, and that his anxiety was a 5. Pt reported being negative SI/HI, no AH/VH noted. A: 15 min checks continued for patient safety. R: Pts safety maintained.

## 2015-08-10 NOTE — Clinical Social Work Note (Signed)
CSW faxed referral to [facility] per pt request.   Trula SladeHeather Smart, Rivers[facility] Park Surgicenter IncCSWA Clinical Social Worker 08/10/2015 2:39 PM

## 2015-08-10 NOTE — [facility] Group Notes (Signed)
[facility] LCSW Group Therapy  08/10/2015 12:52 PM  Type of Therapy:  Group Therapy  Participation Level:  Active  Participation Quality:  Attentive  Affect:  Appropriate  Cognitive:  Oriented  Insight:  Improving  Engagement in Therapy:  Improving  Modes of Intervention:  Confrontation, Discussion, Education, Exploration, Rapport Building, Dance movement psychotherapisteality Testing, Socialization and Support  Summary of Progress/Problems: Feelings around Diagnosis. Windy FastRonald "Gerilyn PilgrimJacob" was attentive and engaged during today's processing group. He shared that he has limited family supports due to their inability to understand his diagnosis (substance abuse related). He talked about his experiences at previous [facility] and [place] [facility] [place] in comparison to [place]/private hospitals. "they view you as a number not as a person. My diagnosis was my identify and that was it."   Smart, Lebron QuamHeather LCSWA 08/10/2015, 12:52 PM

## 2015-08-10 NOTE — Progress Notes (Signed)
[facility]  08/10/2015 6:02 PM Curtis Blankenship  MRN:  [id number] Subjective:  Today is his birthday and has not heard from any of his family members. States he really does not blame them as he has hurt them a lot. He states he has not really address the PTSD. When [place] [facility] [place] programs none of them have been as intensive and specialized in PTSD. He states he has not pushed to address it either as he is afraid "of going there." states that when he drinks and keeps himself under the influence, he does not have nightmares or flashbacks. So he has coped by drinking. States that when he took a look at his life he decided to end it as he is tired  Principal Problem: Severe recurrent major depression without psychotic features (HCC) Diagnosis:   Patient Active Problem List   Diagnosis Date Noted  . Uncomplicated alcohol dependence (HCC) [F10.20]   . Severe recurrent major depression without psychotic features (HCC) [F33.2] 08/04/2015  . Hyponatremia [E87.1] 08/02/2015  . Alcohol abuse [F10.10] 08/02/2015  . Suicidal ideation [R45.851] 08/02/2015  . Aspiration pneumonia (HCC) [J69.0] 08/02/2015  . Syncope [R55] 08/02/2015  . Blurred vision [H53.8]   . TIA (transient ischemic attack) [G45.9] 09/19/2013  . Disturbance of skin sensation [R20.9] 09/19/2013  . 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 Alcohol dependence (HCC) [F10.20] 12/01/2012  . Cocaine abuse [F14.10] 12/01/2012  . PTSD (post-traumatic stress disorder) [F43.10] 12/01/2012  . Depressive disorder [F32.9] 12/01/2012   Total Time spent with patient: 30 minutes  Past Psychiatric History: see Admission H and P  Past Medical History:  Past Medical History  Diagnosis Date  . Bipolar disorder (HCC)   . PTSD (post-traumatic stress disorder)   . Depression     Past Surgical History  Procedure Laterality Date  . Appendectomy     Family History:  Family History  Problem Relation Age of Onset  . Hypertension Mother    . Alcoholism Other    Family Psychiatric  History: see Admission H and P Social History:  History  Alcohol Use  . 97.2 oz/week  . 150 Cans of beer, 12 Shots of liquor per week    Comment: he drinks every day as much as he can     History  Drug Use  . Yes  . Special: Cocaine, Other-see comments, Benzodiazepines    Social History   Social History  . Marital Status: Single    Spouse Name: N/A  . Number of Children: N/A  . Years of Education: N/A   Social History Main Topics  . Smoking status: Current Every Day Smoker -- 1.50 packs/day for 10 years    Types: Cigarettes  . Smokeless tobacco: Former NeurosurgeonUser    Types: Snuff  . Alcohol Use: 97.2 oz/week    150 Cans of beer, 12 Shots of liquor per week     Comment: he drinks every day as much as he can  . Drug Use: Yes    Special: Cocaine, Other-see comments, Benzodiazepines  . Sexual Activity: Not Asked   Other Topics Concern  . None   Social History Narrative   Additional Social History:                         Sleep: Fair  Appetite:  Fair  Current Medications: Current Facility-Administered Medications  Medication Dose Route Frequency Provider Last Rate Last Dose  . alum & mag hydroxide-simeth (MAALOX/MYLANTA)  200-200-20 MG/5ML suspension 30 mL  30 mL Oral Q4H PRN Nelly Rout, MD      . buPROPion (WELLBUTRIN XL) 24 hr tablet 150 mg  150 mg Oral Daily Rachael Fee, MD   150 mg at 08/10/15 0757  . hydrOXYzine (ATARAX/VISTARIL) tablet 50 mg  50 mg Oral QHS PRN Beau Fanny, FNP   50 mg at 08/09/15 2310  . lamoTRIgine (LAMICTAL) tablet 25 mg  25 mg Oral BID Beau Fanny, FNP   25 mg at 08/10/15 0757  . magnesium hydroxide (MILK OF MAGNESIA) suspension 30 mL  30 mL Oral Daily PRN Nelly Rout, MD      . multivitamin with minerals tablet 1 tablet  1 tablet Oral Daily Kerry Hough, PA-C   1 tablet at 08/10/15 0757  . prazosin (MINIPRESS) capsule 2 mg  2 mg Oral QHS Beau Fanny, FNP   2 mg at 08/09/15  2306  . thiamine (B-1) injection 100 mg  100 mg Intramuscular Once Kerry Hough, PA-C   100 mg at 08/03/15 2330  . thiamine (VITAMIN B-1) tablet 100 mg  100 mg Oral Daily Kerry Hough, PA-C   100 mg at 08/10/15 1610  . zolpidem (AMBIEN) tablet 10 mg  10 mg Oral QHS PRN Rachael Fee, MD   10 mg at 08/09/15 2306    Lab Results: No results found for this or any previous visit (from the past 48 hour(s)).  Physical Findings: AIMS: Facial and Oral Movements Muscles of Facial Expression: None, normal Lips and Perioral Area: None, normal Jaw: None, normal Tongue: None, normal,Extremity Movements Upper (arms, wrists, hands, fingers): None, normal Lower (legs, knees, ankles, toes): None, normal, Trunk Movements Neck, shoulders, hips: None, normal, Overall Severity Severity of abnormal movements (highest score from questions above): None, normal Incapacitation due to abnormal movements: None, normal Patient's awareness of abnormal movements (rate only patient's report): No Awareness, Dental Status Current problems with teeth and/or dentures?: No Does patient usually wear dentures?: No  CIWA:  CIWA-Ar Total: 0 COWS:  COWS Total Score: 1  Musculoskeletal: Strength & Muscle Tone: within normal limits Gait & Station: normal Patient leans: normal  Psychiatric Specialty Exam: Review of Systems  Constitutional: Positive for malaise/fatigue.  HENT: Negative.   Eyes: Negative.   Respiratory: Negative.   Cardiovascular: Negative.   Gastrointestinal: Negative.   Genitourinary: Negative.   Musculoskeletal: Negative.   Skin: Negative.   Neurological: Negative.   Endo/Heme/Allergies: Negative.   Psychiatric/Behavioral: Positive for depression, suicidal ideas and substance abuse. The patient is nervous/anxious.     Blood pressure 123/85, pulse 87, temperature 97.7 F (36.5 C), temperature source Oral, resp. rate 16, height  (1.803 m), weight 77.111 kg (170 lb).Body mass index is  23.72 kg/(m^2).  General Appearance: Disheveled  Eye Solicitor::  Fair  Speech:  Clear and Coherent  Volume:  fluctuates  Mood:  Anxious, Depressed and Dysphoric  Affect:  Restricted  Thought Process:  Coherent and Goal Directed  Orientation:  Full (Time, Place, and Person)  Thought Content:  symptoms events worries concerns  Suicidal Thoughts:  Yes.  without intent/plan  Homicidal Thoughts:  No  Memory:  Immediate;   Fair Recent;   Fair Remote;   Fair  Judgement:  Fair  Insight:  Present  Psychomotor Activity:  Restlessness  Concentration:  Fair  Recall:  [employer] of Knowledge:Fair  Language: Fair  Akathisia:  No  Handed:  Right  AIMS (if indicated):  Assets:  Desire for Improvement  ADL's:  Intact  Cognition: WNL  Sleep:  Number of Hours: 5.75   Treatment Plan Summary: Daily contact with patient to assess and evaluate symptoms and progress in treatment and Medication management Supportive approach/coping skills PTSD; continue to use the Minipress 2 mg at HS Depression; will work with the Wellbutrin XL 150 mg with plans to increase it to 300 mg. Although not specific for PTSD it could not caused  the side effects that he cant tolerated from the SSRI's and the NSRI's  Will explore residential treatment programs trough the [place] particularly saw the ones addressing PTSD Use CBT/mindfulness Alechia Lezama A 08/10/2015, 6:02 PM

## 2015-08-10 NOTE — Progress Notes (Signed)
Pt has been in his room most of the evening, reading.  He reports that he has had a fair day.  He denied SI/HI/AVH.  He denies any withdrawal symptoms at this time.  He states he is still feeling depressed and anxious about what he is going to do at discharge.  His discharge plans are still in process.  Pt makes his needs known to staff.  Support and encouragement offered.  Safety maintained with q15 minute checks.

## 2015-08-10 NOTE — Progress Notes (Signed)
Recreation Therapy Notes  Date: 10.12.2016 Time: 9:30am Location: [address] Group Room   Group Topic: Stress Management  Goal Area(s) Addresses:  Patient will actively participate in stress management techniques presented during session.   Behavioral Response: Did not attend.  Marykay Lexenise L Kylia Grajales, LRT/CTRS        Cartier Washko L 08/10/2015 1:00 PM

## 2015-08-11 MED ORDER — LAMOTRIGINE 25 MG PO TABS
50.0000 mg | ORAL_TABLET | Freq: Every day | ORAL | Status: DC
  Administered 2015-08-12 – 2015-08-13 (×2): 50 mg via ORAL
  Filled 2015-08-11 (×5): qty 2

## 2015-08-11 MED ORDER — LAMOTRIGINE 25 MG PO TABS
25.0000 mg | ORAL_TABLET | Freq: Every day | ORAL | Status: DC
  Administered 2015-08-12 – 2015-08-13 (×2): 25 mg via ORAL
  Filled 2015-08-11 (×3): qty 1

## 2015-08-11 NOTE — Progress Notes (Signed)
Patient ID: Curtis Blankenship, male   DOB: [birth year], 34 y.o.   MRN: [id number] PER STATE REGULATIONS 482.30  THIS CHART WAS REVIEWED FOR MEDICAL NECESSITY WITH RESPECT TO THE PATIENT'S ADMISSION/ DURATION OF STAY.  NEXT REVIEW DATE:   08/15/2015  Willa RoughJENNIFER JONES Curtis Mestre, RN, BSN CASE MANAGER'

## 2015-08-11 NOTE — Progress Notes (Signed)
Pt has spent most of the evening in his room reading.  He states he went to meals, but has been in his room most of the day.  He has been especially depressed and sad today as it is his birthday, and he has spent it in the hospital.  He said that later in the day he was able to talk with his mother and some of his friends on the phone.  He is still not sure about his life and feels life is not worth living.  He contracts with Clinical research associatewriter at this time.  He denies HI/AVH.  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  Support and encouragement offered to pt.  Spent time talking with pt about his day and thanked him for his service to our country.  Also talked with him about reading and books.  Pt was able to relax a smile just for a moment.  Pt voices no needs at this time.  Safety maintained with q15 minute checks.

## 2015-08-11 NOTE — Progress Notes (Signed)
Adult Psychoeducational Group Note  Date:  08/11/2015 Time:  09:15am  Group Topic/Focus:  Making Healthy Choices:   The focus of this group is to help patients identify negative/unhealthy choices they were using prior to admission and identify positive/healthier coping strategies to replace them upon discharge.  Participation Level:  Minimal  Participation Quality:  Appropriate  Affect:  Flat  Cognitive:  Alert and Oriented  Insight: Improving  Engagement in Group:  Limited  Modes of Intervention:  Discussion, Education, Orientation and Support  Additional Comments:  Pt forwarded little during groups but his comments showed insight. Pt states "I feel angry today. I don't know why. I'm having a present struggle with myself."  [name], Nini Cavan E 08/11/2015, 1:47 PM

## 2015-08-11 NOTE — [facility] Group Notes (Signed)
[facility] LCSW Group Therapy  08/11/2015 11:36 AM  Type of Therapy:  Group Therapy  Participation Level:  Active  Participation Quality:  Attentive  Affect:  Appropriate  Cognitive:  Alert and Oriented  Insight:  Improving  Engagement in Therapy:  Improving  Modes of Intervention:  Confrontation, Discussion, Education, Problem-solving, Rapport Building, Socialization and Support  Summary of Progress/Problems:  Finding Balance in Life. Today's group focused on defining balance in one's own words, identifying things that can knock one off balance, and exploring healthy ways to maintain balance in life. Group members were asked to provide an example of a time when they felt off balance, describe how they handled that situation,and process healthier ways to regain balance in the future. Group members were asked to share the most important tool for maintaining balance that they [facility] and how they plan to apply this method after discharge. Curtis FastRonald "Curtis Blankenship" was attentive and engaged during today's processing group. He shared that he feels off balance due to not having any family supports, and not having a set plan at discharge. Curtis Blankenship feels [facility] and is working with CSW on referral. [facility] Curtis Blankenship feels that he needs continued hospitalization and help addressing substance abuse issues in order to be successful in recovery.    Smart, Curtis Blankenship LCSWA 08/11/2015, 11:36 AM

## 2015-08-11 NOTE — Progress Notes (Signed)
Patient ID: Curtis Blankenship, male   DOB: 1981/07/18, 34 y.o.   MRN: [id number]  Pt currently presents with a flat affect and isolative behavior. Pt reports "I don't know why but I'm angry today. I'm in a present struggle with myself." Pt short and irritable with staff this morning, pt interaction becomes more pleasant throughout the day. Pt identifies reading as a way to practice mindfulness.   Pt provided with medications per providers orders. Pt's labs and vitals were monitored throughout the day. Pt supported emotionally and encouraged to express concerns and questions. Pt educated on medications.  Pt's safety ensured with 15 minute and environmental checks. Pt currently denies SI/HI and A/V hallucinations. Pt verbally agrees to seek staff if SI/HI or A/VH occurs and to consult with staff before acting on these thoughts. Will continue POC.

## 2015-08-11 NOTE — Progress Notes (Signed)
[facility] MD Progress Note  08/11/2015 5:12 PM Curtis Blankenship  MRN:  [id number] Subjective:  Curtis Blankenship continues to endorse that he has no hope that things are going to get better for him. His mother called last night to wish him happy birthday he perceived her supportive but distant and told him things were going to get better for him. He states he cant belief it. He just goes through the motions. Describes the isolation he has been living in. The few relationships are comrades that are in the same or worst situation that he is in that called him only when they want to talk about what they went trough. 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  Principal Problem: Severe recurrent major depression without psychotic features (HCC) Diagnosis:   Patient Active Problem List   Diagnosis Date Noted  . Uncomplicated alcohol dependence (HCC) [F10.20]   . Severe recurrent major depression without psychotic features (HCC) [F33.2] 08/04/2015  . Hyponatremia [E87.1] 08/02/2015  . Alcohol abuse [F10.10] 08/02/2015  . Suicidal ideation [R45.851] 08/02/2015  . Aspiration pneumonia (HCC) [J69.0] 08/02/2015  . Syncope [R55] 08/02/2015  . Blurred vision [H53.8]   . TIA (transient ischemic attack) [G45.9] 09/19/2013  . Disturbance of skin sensation [R20.9] 09/19/2013  . 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 Alcohol dependence (HCC) [F10.20] 12/01/2012  . Cocaine abuse [F14.10] 12/01/2012  . PTSD (post-traumatic stress disorder) [F43.10] 12/01/2012  . Depressive disorder [F32.9] 12/01/2012   Total Time spent with patient: 30 minutes  Past Psychiatric History: see Admission H and P  Past Medical History:  Past Medical History  Diagnosis Date  . Bipolar disorder (HCC)   . PTSD (post-traumatic stress disorder)   . Depression     Past Surgical History  Procedure Laterality Date  .  Appendectomy     Family History:  Family History  Problem Relation Age of Onset  . Hypertension Mother   . Alcoholism Other    Family Psychiatric  History: see Admission H and P Social History:  History  Alcohol Use  . 97.2 oz/week  . 150 Cans of beer, 12 Shots of liquor per week    Comment: he drinks every day as much as he can     History  Drug Use  . Yes  . Special: Cocaine, Other-see comments, Benzodiazepines    Social History   Social History  . Marital Status: Single    Spouse Name: N/Blankenship  . Number of Children: N/Blankenship  . Years of Education: N/Blankenship   Social History Main Topics  . Smoking status: Current Every Day Smoker -- 1.50 packs/day for 10 years    Types: Cigarettes  . Smokeless tobacco: Former Neurosurgeon    Types: Snuff  . Alcohol Use: 97.2 oz/week    150 Cans of beer, 12 Shots of liquor per week     Comment: he drinks every day as much as he can  . Drug Use: Yes    Special: Cocaine, Other-see comments, Benzodiazepines  . Sexual Activity: Not Asked   Other Topics Concern  . None   Social History Narrative   Additional Social History:                         Sleep: Fair  Appetite:  Fair  Current Medications: Current Facility-Administered Medications  Medication Dose Route Frequency Provider Last Rate Last  Dose  . alum & mag hydroxide-simeth (MAALOX/MYLANTA) 200-200-20 MG/5ML suspension 30 mL  30 mL Oral Q4H PRN Nelly Rout, MD      . buPROPion (WELLBUTRIN XL) 24 hr tablet 150 mg  150 mg Oral Daily Rachael Fee, MD   150 mg at 08/11/15 0755  . hydrOXYzine (ATARAX/VISTARIL) tablet 50 mg  50 mg Oral QHS PRN Beau Fanny, FNP   50 mg at 08/10/15 2136  . lamoTRIgine (LAMICTAL) tablet 25 mg  25 mg Oral BID Beau Fanny, FNP   25 mg at 08/11/15 1705  . magnesium hydroxide (MILK OF MAGNESIA) suspension 30 mL  30 mL Oral Daily PRN Nelly Rout, MD      . multivitamin with minerals tablet 1 tablet  1 tablet Oral Daily Kerry Hough, PA-C   1 tablet  at 08/11/15 0756  . prazosin (MINIPRESS) capsule 2 mg  2 mg Oral QHS Beau Fanny, FNP   2 mg at 08/10/15 2137  . thiamine (B-1) injection 100 mg  100 mg Intramuscular Once Kerry Hough, PA-C   100 mg at 08/03/15 2330  . thiamine (VITAMIN B-1) tablet 100 mg  100 mg Oral Daily Kerry Hough, PA-C   100 mg at 08/11/15 1610  . zolpidem (AMBIEN) tablet 10 mg  10 mg Oral QHS PRN Rachael Fee, MD   10 mg at 08/10/15 2137    Lab Results: No results found for this or any previous visit (from the past 48 hour(s)).  Physical Findings: AIMS: Facial and Oral Movements Muscles of Facial Expression: None, normal Lips and Perioral Area: None, normal Jaw: None, normal Tongue: None, normal,Extremity Movements Upper (arms, wrists, hands, fingers): None, normal Lower (legs, knees, ankles, toes): None, normal, Trunk Movements Neck, shoulders, hips: None, normal, Overall Severity Severity of abnormal movements (highest score from questions above): None, normal Incapacitation due to abnormal movements: None, normal Patient's awareness of abnormal movements (rate only patient's report): No Awareness, Dental Status Current problems with teeth and/or dentures?: No Does patient usually wear dentures?: No  CIWA:  CIWA-Ar Total: 5 COWS:  COWS Total Score: 1  Musculoskeletal: Strength & Muscle Tone: within normal limits Gait & Station: normal Patient leans: normal  Psychiatric Specialty Exam: Review of Systems  Constitutional: Negative.   HENT: Negative.   Eyes: Negative.   Respiratory: Negative.   Cardiovascular: Negative.   Gastrointestinal: Negative.   Genitourinary: Negative.   Musculoskeletal: Negative.   Skin: Negative.   Neurological: Negative.   Endo/Heme/Allergies: Negative.   Psychiatric/Behavioral: Positive for depression and substance abuse. The patient is nervous/anxious.     Blood pressure 112/59, pulse 79, temperature 97.8 F (36.6 C), temperature source Oral, resp. rate 16,  height  (1.803 m), weight 77.111 kg (170 lb).Body mass index is 23.72 kg/(m^2).  General Appearance: Fairly Groomed  Patent attorney::  Fair  Speech:  Clear and Coherent  Volume:  fluctuates  Mood:  Anxious and Depressed  Affect:  Restricted  Thought Process:  Coherent and Goal Directed  Orientation:  Full (Time, Place, and Person)  Thought Content:  symptoms events worries concerns  Suicidal Thoughts:  On and off  Homicidal Thoughts:  No  Memory:  Immediate;   Fair Recent;   Fair Remote;   Fair  Judgement:  Fair  Insight:  Present  Psychomotor Activity:  Restlessness  Concentration:  Fair  Recall:  [employer] of Knowledge:Fair  Language: Fair  Akathisia:  No  Handed:  Right  AIMS (  if indicated):     Assets:  Desire for Improvement  ADL's:  Intact  Cognition: WNL  Sleep:  Number of Hours: 5.75   Treatment Plan Summary: Daily contact with patient to assess and evaluate symptoms and progress in treatment and Medication management Supportive approach/coping skills Alcohol and cocaine abuse-dependence; continue to work Blankenship relapse prevention plan PTSD ;continue the minipress 2 mg HS Mood instability; increase the Lamictal to 75 mg  Depression; continue the Wellbutrin XL 150 mg plan to increase it in the next couple of days Use CBT/mindfulness encourage to start processing the trauma as he feels comfortable Curtis Blankenship 08/11/2015, 5:12 PM

## 2015-08-11 NOTE — Plan of Care (Signed)
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  He feels it would be easier on everyone if he were dead.

## 2015-08-11 NOTE — Plan of Care (Signed)
Problem: Alteration in mood & ability to function due to Goal: LTG-Patient demonstrates decreased signs of withdrawal (Patient demonstrates decreased signs of withdrawal to the point the patient is safe to return home and continue treatment in an outpatient setting)  Outcome: Completed/Met Date Met:  08/11/15 Pt reports he is not having any withdrawal symptoms at this point.

## 2015-08-12 NOTE — Progress Notes (Signed)
Pt attended Group Meeting and was appropriate.  

## 2015-08-12 NOTE — Progress Notes (Addendum)
D.  Pt in room on approach, denies complaints at this time.  Pt did not attend evening AA group, minimal interaction on unit.   Denies SI/HI/halluciantions at this time.  A.  Support and encouragement offered  R.  Pt remains safe on the unit, will continue to monitor.

## 2015-08-12 NOTE — [facility] Group Notes (Signed)
[facility]   08/12/2015 11:24 AM  Participation Quality:  Invited. DID NOT ATTEND. Pt chose to remain in bed.   Smart, American FinancialHeather LCSWA

## 2015-08-12 NOTE — Progress Notes (Signed)
[facility] MD Progress Note  08/12/2015 3:51 PM Jp Eastham  MRN:  [id number] Subjective:  Curtis Blankenship is still feeling unstable. Admits to irritability. Easy to " go off." he is still trying to have some hope but he is not. States he does not think things can get better for him. States when he drinks he does not have the anxiety the suspiciousness the irritability nor the paranoia. States he is a "happy drunk." but he is not going to be able to continue drinking. States he can understand why people do not particularly like to be around him as he himself hates to be around himself.  Principal Problem: Severe recurrent major depression without psychotic features (HCC) Diagnosis:   Patient Active Problem List   Diagnosis Date Noted  . Uncomplicated alcohol dependence (HCC) [F10.20]   . Severe recurrent major depression without psychotic features (HCC) [F33.2] 08/04/2015  . Hyponatremia [E87.1] 08/02/2015  . Alcohol abuse [F10.10] 08/02/2015  . Suicidal ideation [R45.851] 08/02/2015  . Aspiration pneumonia (HCC) [J69.0] 08/02/2015  . Syncope [R55] 08/02/2015  . Blurred vision [H53.8]   . TIA (transient ischemic attack) [G45.9] 09/19/2013  . Disturbance of skin sensation [R20.9] 09/19/2013  . 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 Alcohol dependence (HCC) [F10.20] 12/01/2012  . Cocaine abuse [F14.10] 12/01/2012  . PTSD (post-traumatic stress disorder) [F43.10] 12/01/2012  . Depressive disorder [F32.9] 12/01/2012   Total Time spent with patient: 30 minutes  Past Psychiatric History: see admission H and P  Past Medical History:  Past Medical History  Diagnosis Date  . Bipolar disorder (HCC)   . PTSD (post-traumatic stress disorder)   . Depression     Past Surgical History  Procedure Laterality Date  . Appendectomy     Family History:  Family History  Problem Relation Age of Onset  . Hypertension Mother   . Alcoholism Other    Family Psychiatric  History: see admission H and P Social  History:  History  Alcohol Use  . 97.2 oz/week  . 150 Cans of beer, 12 Shots of liquor per week    Comment: he drinks every day as much as he can     History  Drug Use  . Yes  . Special: Cocaine, Other-see comments, Benzodiazepines    Social History   Social History  . Marital Status: Single    Spouse Name: N/A  . Number of Children: N/A  . Years of Education: N/A   Social History Main Topics  . Smoking status: Current Every Day Smoker -- 1.50 packs/day for 10 years    Types: Cigarettes  . Smokeless tobacco: Former Neurosurgeon    Types: Snuff  . Alcohol Use: 97.2 oz/week    150 Cans of beer, 12 Shots of liquor per week     Comment: he drinks every day as much as he can  . Drug Use: Yes    Special: Cocaine, Other-see comments, Benzodiazepines  . Sexual Activity: Not Asked   Other Topics Concern  . None   Social History Narrative   Additional Social History:                         Sleep: Poor  Appetite:  Fair  Current Medications: Current Facility-Administered Medications  Medication Dose Route Frequency Provider Last Rate Last Dose  . alum & mag hydroxide-simeth (MAALOX/MYLANTA) 200-200-20 MG/5ML suspension 30 mL  30 mL Oral Q4H PRN Nelly Rout, MD      . buPROPion (WELLBUTRIN XL) 24  hr tablet 150 mg  150 mg Oral Daily Rachael Fee, MD   150 mg at 08/12/15 0844  . hydrOXYzine (ATARAX/VISTARIL) tablet 50 mg  50 mg Oral QHS PRN Beau Fanny, FNP   50 mg at 08/11/15 2242  . lamoTRIgine (LAMICTAL) tablet 25 mg  25 mg Oral Daily Rachael Fee, MD      . lamoTRIgine (LAMICTAL) tablet 50 mg  50 mg Oral Daily Rachael Fee, MD   50 mg at 08/12/15 0844  . magnesium hydroxide (MILK OF MAGNESIA) suspension 30 mL  30 mL Oral Daily PRN Nelly Rout, MD      . multivitamin with minerals tablet 1 tablet  1 tablet Oral Daily Kerry Hough, PA-C   1 tablet at 08/12/15 0844  . prazosin (MINIPRESS) capsule 2 mg  2 mg Oral QHS Beau Fanny, FNP   2 mg at 08/11/15 2242   . thiamine (B-1) injection 100 mg  100 mg Intramuscular Once Kerry Hough, PA-C   100 mg at 08/03/15 2330  . thiamine (VITAMIN B-1) tablet 100 mg  100 mg Oral Daily Kerry Hough, PA-C   100 mg at 08/12/15 0844  . zolpidem (AMBIEN) tablet 10 mg  10 mg Oral QHS PRN Rachael Fee, MD   10 mg at 08/11/15 2242    Lab Results: No results found for this or any previous visit (from the past 48 hour(s)).  Physical Findings: AIMS: Facial and Oral Movements Muscles of Facial Expression: None, normal Lips and Perioral Area: None, normal Jaw: None, normal Tongue: None, normal,Extremity Movements Upper (arms, wrists, hands, fingers): None, normal Lower (legs, knees, ankles, toes): None, normal, Trunk Movements Neck, shoulders, hips: None, normal, Overall Severity Severity of abnormal movements (highest score from questions above): None, normal Incapacitation due to abnormal movements: None, normal Patient's awareness of abnormal movements (rate only patient's report): No Awareness, Dental Status Current problems with teeth and/or dentures?: No Does patient usually wear dentures?: No  CIWA:  CIWA-Ar Total: 0 COWS:  COWS Total Score: 1  Musculoskeletal: Strength & Muscle Tone: within normal limits Gait & Station: normal Patient leans: normal  Psychiatric Specialty Exam: Review of Systems  Constitutional: Negative.   HENT: Negative.   Eyes: Negative.   Respiratory: Negative.   Cardiovascular: Negative.   Gastrointestinal: Negative.   Genitourinary: Negative.   Musculoskeletal: Negative.   Skin: Negative.   Neurological: Negative.   Endo/Heme/Allergies: Negative.   Psychiatric/Behavioral: Positive for depression and substance abuse. The patient is nervous/anxious.     Blood pressure 115/78, pulse 92, temperature 97.9 F (36.6 C), temperature source Oral, resp. rate 16, height  (1.803 m), weight 77.111 kg (170 lb).Body mass index is 23.72 kg/(m^2).  General Appearance: Fairly  Groomed  Patent attorney::  Fair  Speech:  Clear and Coherent  Volume:  fluctuates  Mood:  Dysphoric and Irritable  Affect:  Labile  Thought Process:  Coherent and Goal Directed  Orientation:  Full (Time, Place, and Person)  Thought Content:  symptoms envents worries concerns  Suicidal Thoughts:  Yes.  without intent/plan  Homicidal Thoughts:  No  Memory:  Immediate;   Fair Recent;   Fair Remote;   Fair  Judgement:  Fair  Insight:  Present  Psychomotor Activity:  Restlessness  Concentration:  Fair  Recall:  [employer] of Knowledge:Fair  Language: Fair  Akathisia:  No  Handed:  Right  AIMS (if indicated):     Assets:  Desire for Improvement  ADL's:  Intact  Cognition: WNL  Sleep:  Number of Hours: 5.25   Treatment Plan Summary: Daily contact with patient to assess and evaluate symptoms and progress in treatment and Medication management Supportive approach/coping skills Alcohol dependence-cocaine abuse; will continue to work a relapse prevention plan Mood instability; will continue to optimize response to the Lamictal, was increased to 50/25 today, will reassess for further increases as tolerated Depression: will continue the Wellbutrin XL 150 mg in AM but will be mindful of possible increase in irritability secondary to the Wellbutrin PTSD; will continue to facilitate his processing the 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

## 2015-08-12 NOTE — [facility] Group Notes (Signed)
[facility] LCSW Group Therapy  08/12/2015 12:21 PM  Type of Therapy:  Group Therapy  Participation Level:  Active  Participation Quality:  Attentive  Affect:  Appropriate  Cognitive:  Alert  Insight:  Engaged  Engagement in Therapy:  Engaged  Modes of Intervention:  Confrontation, Discussion, Education, Exploration, Problem-solving, Rapport Building, Socialization and Support  Summary of Progress/Problems: Feelings around Relapse. Group members discussed the meaning of relapse and shared personal stories of relapse, how it affected them and others, and how they perceived themselves during this time. Group members were encouraged to identify triggers, warning signs and coping skills used when facing the possibility of relapse. Social supports were discussed and explored in detail. Post Acute Withdrawal Syndrome (handout provided) was introduced and examined. Pt's were encouraged to ask questions, talk about key points associated with PAWS, and process this information in terms of relapse prevention. Curtis Blankenship "Curtis Blankenship" was attentive and engaged during today's processing group. He shared his past experiences with PAWS and explained that "loneliness is a big trigger for me. When I'm not drinking I tend to isolate, and get lonely." Curtis Blankenship was receptive to learning more about safety planning and increasing his support network. "I just don't like AA but I'm open to other options."   Smart, Whitley Patchen LCSWA 08/12/2015, 12:21 PM

## 2015-08-12 NOTE — Tx Team (Signed)
Interdisciplinary Treatment Plan Update (Adult)  Date:  08/12/2015  Time Reviewed:  3:00 PM   Progress in Treatment: Attending groups:  Intermittently  Participating in groups:  Yes, when he attends  Taking medication as prescribed:  Yes. Tolerating medication:  Yes. Family/Significant othe contact made:  SPE completed with pt, as he refused contact with family.  Patient understands diagnosis:  Yes. and As evidenced by:  seeking treatment for SI with overdose of cocaine, alcohol abuse, depression, and medication stabilization. Discussing patient identified problems/goals with staff:  Yes. Medical problems stabilized or resolved:  Yes. Denies suicidal/homicidal ideation: Yes. Self report.  Issues/concerns per patient self-inventory:  Other:  Discharge Plan or Barriers: Pt referred to [place] program at [facility] today. Denied at [facility]. Pt declined bed [facility] bed transfer to [facility] unit. [facility] but does not want to schedule appt for phone interview yet despite CSW encouragement.    Reason for Continuation of Hospitalization: Depression Medication stabilization  Comments:  Curtis Blankenship is a 34 y.o. male with history of alcohol and cocaine abuse presented to the ER because of suicidal ideation. Patient states he has been drinking alcohol everyday and last evening had taken extra dose of cocaine with intention of suicide. Patient states that he passed out following the last dose of cocaine. When he woke up he started developing chest pressure which lasted for 1 hour and resolved. Patient has been having some left-sided blurred vision since then. Patient hired a cab and came to the ER. Prior to which patient also had one episode of nausea vomiting. In the ER patient's labs revealed positive cocaine with mild hyponatremia and chest x-ray showing possible infiltrate. Patient was placed on suicide precaution and admitted for further management.  Denies any  shortness of breath and chest pain at this time is resolved. Still complains of some left-sided blurred vision.  Estimated length of stay:  2-4 days   New goal(s): to develop effective after care plan.   Additional Comments:  Patient and CSW reviewed pt's identified goals and treatment plan. Patient verbalized understanding and agreed to treatment plan. CSW reviewed [facility] "Discharge Process and Patient Involvement" Form. Pt verbalized understanding of information provided and signed form.    Review of initial/current patient goals per problem list:  1. Goal(s): Patient will participate in aftercare plan  Met: No.   Target date: at discharge  As evidenced by: Patient will participate within aftercare plan AEB aftercare provider and housing plan at discharge being identified.  10/6: CSW assessing for appropriate referrals.   10/11: Pt requesting referral to [place] program CSW assessing.   10/14: [facility]   2. Goal (s): Patient will exhibit decreased depressive symptoms and suicidal ideations.  Met: No.    Target date: at discharge  As evidenced by: Patient will utilize self rating of depression at 3 or below and demonstrate decreased signs of depression or be deemed stable for discharge by MD.  10/6: Pt rates depression as high to day. Denies SI/HI/AVH.   10/11: Pt continues to rate depression as high today with no SI.   10/14: Pt rates depression as high today and presents with irritable mood.    3. Goal(s): Patient will demonstrate decreased signs of withdrawal due to substance abuse  Met:Yes   Target date:at discharge   As evidenced by: Patient will produce a CIWA/COWS score of 0, have stable vitals  signs, and no symptoms of withdrawal.    10/6: Pt reports moderate withdrawals with CIWA score of 11 and high sitting BP.    10/11: Pt reports no signs of withdrawal with  CIWA/COWS of 0 and stable vitals.   Attendees: Patient:   08/12/2015 3:00 PM   Family:   08/12/2015 3:00 PM   Physician:  Dr. Carlton Adam, MD 08/12/2015 3:00 PM   Nursing: Gerald Stabs; Clarise Cruz RN 08/12/2015 3:00 PM   Clinical Social Worker: [place], [place]  08/12/2015 3:00 PM   Clinical Social Worker: Erasmo Downer Drinkard LCSWA  08/12/2015 3:00 PM   Other:  Gerline Legacy Nurse Case Manager 08/12/2015 3:00 PM   Other:  Lucinda Dell; [facility]  08/12/2015 3:00 PM   Other:   08/12/2015 3:00 PM   Other:  08/12/2015 3:00 PM   Other:  08/12/2015 3:00 PM   Other:  08/12/2015 3:00 PM    08/12/2015 3:00 PM    08/12/2015 3:00 PM    08/12/2015 3:00 PM    08/12/2015 3:00 PM    Scribe for Treatment Team:   Maxie Better, [place]  08/12/2015 3:00 PM

## 2015-08-12 NOTE — Progress Notes (Signed)
Pt has been in his room most of the evening, except when [place].  When writer went to check on pt early in the shift, he seemed irritable and not willing to talk with Clinical research associatewriter, but did say he was ok.  By the time he received his meds at bedtime, his mood had softened.  He said that nothing has changed, because he is still having thoughts to kill himself.  He spends most of his time reading.  He says it serves as an escape from reality for him.  He says he is still having trouble sleeping, even with the Minipress.  Discharge plans are in process.  [facility]  Pt makes his needs known to staff.  Support and encouragement offered.  Safety maintained with q15 minute checks.

## 2015-08-12 NOTE — Progress Notes (Signed)
Patient did attend the evening karaoke group. Pt was engaged and supportive but did not participate by singing a song.    

## 2015-08-12 NOTE — Progress Notes (Addendum)
Patient ID: Curtis Blankenship, male   DOB: 01-08-1981, 34 y.o.   MRN: [id number]   Pt currently presents with a flat affect and irritable behavior. Pt refused to fill out self inventory sheet stating "I'm not a survey kind of guy" and threw the paper at the [facility]. Pt remains in his room and did not attended any groups.  Pt provided with medications per providers orders. Pt's labs and vitals were monitored throughout the day. Pt supported emotionally and encouraged to express concerns and questions. Pt educated on medications. Pt attends most groups on the unit today.   Pt's safety ensured with 15 minute and environmental checks. Pt currently denies SI/HI and A/V hallucinations. Pt verbally agrees to seek staff if SI and HI by stating "No, No, No, No, No, No" to writer at the med window. Pt walks away from medication window at this time. Will continue POC.

## 2015-08-13 MED ORDER — PRAZOSIN HCL 1 MG PO CAPS
3.0000 mg | ORAL_CAPSULE | Freq: Every day | ORAL | Status: DC
  Administered 2015-08-13 – 2015-08-19 (×7): 3 mg via ORAL
  Filled 2015-08-13 (×10): qty 3

## 2015-08-13 MED ORDER — PRAZOSIN HCL 2 MG PO CAPS
3.0000 mg | ORAL_CAPSULE | Freq: Every day | ORAL | Status: DC
  Filled 2015-08-13: qty 1

## 2015-08-13 MED ORDER — LAMOTRIGINE 25 MG PO TABS
50.0000 mg | ORAL_TABLET | Freq: Two times a day (BID) | ORAL | Status: DC
  Administered 2015-08-14 – 2015-08-19 (×11): 50 mg via ORAL
  Administered 2015-08-19: 08:00:00 via ORAL
  Administered 2015-08-20 – 2015-08-23 (×8): 50 mg via ORAL
  Filled 2015-08-13 (×23): qty 2

## 2015-08-13 MED ORDER — LAMOTRIGINE 25 MG PO TABS: 50.0000 mg | ORAL_TABLET | Freq: Every day | ORAL | Status: DC

## 2015-08-13 NOTE — Progress Notes (Signed)
Patient ID: Maxcine HamRonald Wadding, male   DOB: 10/14/1981, 34 y.o.   MRN: [id number]  Pt currently presents with a flat affect and isolative behavior. Pt attends groups but chooses not to participate. Writer asked [facility] to fill out self inventory sheet, pt complied. Per self inventory, pt rates depression at a 5, hopelessness 5 and anxiety 5. Pt's daily goal is to "n/a" and they intend to do so by "n/a." Pt reports poor sleep, a fair appetite, low energy and poor concentration.   Pt provided with medications per providers orders. Pt's labs and vitals were monitored throughout the day. Pt supported emotionally and encouraged to express concerns and questions. Pt educated on medications. Pt sits in his room reading or sits in the dayroom with his hair covering his eyes and his arms and legs crossed.   Pt's safety ensured with 15 minute and environmental checks. Pt currently denies SI/HI and A/V hallucinations. Pt verbally agrees to seek staff if SI/HI or A/VH occurs and to consult with staff before acting on these thoughts. Will continue POC.

## 2015-08-13 NOTE — Plan of Care (Signed)
Problem: Diagnosis: Increased Risk For Suicide Attempt Goal: STG-Patient Will Comply With Medication Regime Outcome: Progressing Pt is compliant with medication administration

## 2015-08-13 NOTE — Progress Notes (Signed)
Pt attended AA Group meeting. Pt was appropriate during the meeting.  

## 2015-08-13 NOTE — Progress Notes (Signed)
Patient ID: Curtis Blankenship, male   DOB: [birth year], 34 y.o.   MRN: [id number] Adult Psychoeducational Group Note  Date:  08/13/2015 Time: 09:15am  Group Topic/Focus:  Recovery Goals:   The focus of this group is to identify appropriate goals for recovery and establish a plan to achieve them.  Participation Level:  Minimal  Participation Quality:  Attentive  Affect:  Flat  Cognitive:  Alert and Oriented  Insight: Lacking  Engagement in Group:  Lacking  Modes of Intervention:  Discussion, Education and Support  Additional Comments:  Pt attended but chose not to participate in this group.   [name], Ysabella Babiarz E 08/13/2015, 11:00 AM

## 2015-08-13 NOTE — [facility] Group Notes (Signed)
[facility] Group Notes:  (Clinical Social Work)  08/13/2015     10-11AM  Summary of Progress/Problems:   The main focus of today's process group was to learn how to use a decisional balance exercise to move forward in the Stages of Change.  Patients listed needs on the whiteboard and unhealthy coping techniques often used to fill needs.  Motivational Interviewing and the whiteboard were utilized to help patients explore in depth the perceived benefits and costs of unhealthy coping techniques, as well as the  benefits and costs of replacing that with a healthy coping skills.  The patient expressed that his own unhealthy coping does involve drugs and alcohol.  His current stage of change is contemplation, because he realizes he has not yet accepted that he has a problem.  He kept talking about wanting to be healthy, but drink like regular people do.    Type of Therapy:  Group Therapy - Process   Participation Level:  Active  Participation Quality:  Attentive, Sharing and Supportive  Affect:  Blunted  Cognitive:  Alert and Appropriate  Insight:  Developing/Improving  Engagement in Therapy:  Engaged  Modes of Intervention:  Education, Motivational Interviewing  Ambrose MantleMareida Grossman-Orr, LCSW 08/13/2015, 1:01 PM

## 2015-08-13 NOTE — Progress Notes (Signed)
D.  Pt in room on approach, denies complaints at this time.  Positive for evening AA group, minimal interaction observed on the unit.  Denies SI/HI/hallucinations at this time.  A.  Support and encouragement offered  R.  Pt remains safe on the unit, will continue to monitor.

## 2015-08-13 NOTE — Progress Notes (Signed)
Psychoeducational Group Note  Date: 08/13/2015 [id number]  Group Topic/Focus:  Identifying Needs:   The focus of this group is to help patients identify their personal needs that have been historically problematic and identify healthy behaviors to address their needs.  Participation Level:  Did not attendJudge, Shanya Ferriss A 

## 2015-08-13 NOTE — Progress Notes (Signed)
[facility] MD Progress Note  08/13/2015 5:56 PM Curtis Blankenship  MRN:  [id number] Subjective:  Curtis Blankenship endorses increased in the nightmares. He is dreaming of friends who were killed in battle. Seems the prazosin helps in the beginning and then quits working as well. He is still trying to figure out what to do. Still with a sense of helplessness hopelessness. He feels ashamed and guilty for what he has make his mother go trough. So he tries not to bother her.  Admits he isolates and does not have any friends other than the few comrades he has left. He has a hard time interacting with people. He drinks to be able to do so Principal Problem: Severe recurrent major depression without psychotic features [facility]) Diagnosis:   Patient Active Problem List   Diagnosis Date Noted  . Uncomplicated alcohol dependence (HCC) [F10.20]   . Severe recurrent major depression without psychotic features (HCC) [F33.2] 08/04/2015  . Hyponatremia [E87.1] 08/02/2015  . Alcohol abuse [F10.10] 08/02/2015  . Suicidal ideation [R45.851] 08/02/2015  . Aspiration pneumonia (HCC) [J69.0] 08/02/2015  . Syncope [R55] 08/02/2015  . Blurred vision [H53.8]   . TIA (transient ischemic attack) [G45.9] 09/19/2013  . Disturbance of skin sensation [R20.9] 09/19/2013  . 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 Alcohol dependence (HCC) [F10.20] 12/01/2012  . Cocaine abuse [F14.10] 12/01/2012  . PTSD (post-traumatic stress disorder) [F43.10] 12/01/2012  . Depressive disorder [F32.9] 12/01/2012   Total Time spent with patient: 30 minutes  Past Psychiatric History: see Admission H and P  Past Medical History:  Past Medical History  Diagnosis Date  . Bipolar disorder (HCC)   . PTSD (post-traumatic stress disorder)   . Depression     Past Surgical History  Procedure Laterality Date  . Appendectomy     Family History:  Family History  Problem Relation Age of Onset  . Hypertension Mother   . Alcoholism Other    Family Psychiatric   History: see admission H and P Social History:  History  Alcohol Use  . 97.2 oz/week  . 150 Cans of beer, 12 Shots of liquor per week    Comment: he drinks every day as much as he can     History  Drug Use  . Yes  . Special: Cocaine, Other-see comments, Benzodiazepines    Social History   Social History  . Marital Status: Single    Spouse Name: N/A  . Number of Children: N/A  . Years of Education: N/A   Social History Main Topics  . Smoking status: Current Every Day Smoker -- 1.50 packs/day for 10 years    Types: Cigarettes  . Smokeless tobacco: Former Neurosurgeon    Types: Snuff  . Alcohol Use: 97.2 oz/week    150 Cans of beer, 12 Shots of liquor per week     Comment: he drinks every day as much as he can  . Drug Use: Yes    Special: Cocaine, Other-see comments, Benzodiazepines  . Sexual Activity: Not Asked   Other Topics Concern  . None   Social History Narrative   Additional Social History:                         Sleep: Poor increased nighmares  Appetite:  Fair  Current Medications: Current Facility-Administered Medications  Medication Dose Route Frequency Provider Last Rate Last Dose  . alum & mag hydroxide-simeth (MAALOX/MYLANTA) 200-200-20 MG/5ML suspension 30 mL  30 mL Oral Q4H PRN Nelly Rout, MD      .  buPROPion (WELLBUTRIN XL) 24 hr tablet 150 mg  150 mg Oral Daily Rachael FeeIrving A Ambria Mayfield, MD   150 mg at 08/13/15 [id number]  . hydrOXYzine (ATARAX/VISTARIL) tablet 50 mg  50 mg Oral QHS PRN Beau FannyJohn C Withrow, FNP   50 mg at 08/12/15 2229  . lamoTRIgine (LAMICTAL) tablet 25 mg  25 mg Oral Daily Rachael FeeIrving A Treva Huyett, MD   25 mg at 08/13/15 1718  . lamoTRIgine (LAMICTAL) tablet 50 mg  50 mg Oral Daily Rachael FeeIrving A Josedaniel Haye, MD   50 mg at 08/13/15 [id number]  . magnesium hydroxide (MILK OF MAGNESIA) suspension 30 mL  30 mL Oral Daily PRN Nelly RoutArchana Kumar, MD      . multivitamin with minerals tablet 1 tablet  1 tablet Oral Daily Kerry HoughSpencer E Simon, PA-C   1 tablet at 08/13/15 [id number]  . prazosin  (MINIPRESS) capsule 3 mg  3 mg Oral QHS Rachael FeeIrving A Lanise Mergen, MD      . thiamine (B-1) injection 100 mg  100 mg Intramuscular Once Kerry HoughSpencer E Simon, PA-C   100 mg at 08/03/15 2330  . thiamine (VITAMIN B-1) tablet 100 mg  100 mg Oral Daily Kerry HoughSpencer E Simon, PA-C   100 mg at 08/13/15 [id number]  . zolpidem (AMBIEN) tablet 10 mg  10 mg Oral QHS PRN Rachael FeeIrving A Sharalyn Lomba, MD   10 mg at 08/12/15 2229    Lab Results: No results found for this or any previous visit (from the past 48 hour(s)).  Physical Findings: AIMS: Facial and Oral Movements Muscles of Facial Expression: None, normal Lips and Perioral Area: None, normal Jaw: None, normal Tongue: None, normal,Extremity Movements Upper (arms, wrists, hands, fingers): None, normal Lower (legs, knees, ankles, toes): None, normal, Trunk Movements Neck, shoulders, hips: None, normal, Overall Severity Severity of abnormal movements (highest score from questions above): None, normal Incapacitation due to abnormal movements: None, normal Patient's awareness of abnormal movements (rate only patient's report): No Awareness, Dental Status Current problems with teeth and/or dentures?: No Does patient usually wear dentures?: No  CIWA:  CIWA-Ar Total: 1 COWS:  COWS Total Score: 1  Musculoskeletal: Strength & Muscle Tone: within normal limits Gait & Station: normal Patient leans: normal  Psychiatric Specialty Exam: Review of Systems  Constitutional: Positive for malaise/fatigue.  HENT: Negative.   Eyes: Negative.   Respiratory: Negative.   Cardiovascular: Negative.   Gastrointestinal: Negative.   Genitourinary: Negative.   Musculoskeletal: Negative.   Skin: Negative.   Neurological: Negative.   Endo/Heme/Allergies: Negative.   Psychiatric/Behavioral: Positive for depression and substance abuse. The patient is nervous/anxious and has insomnia.     Blood pressure 114/71, pulse 94, temperature 97.7 F (36.5 C), temperature source Oral, resp. rate 16, height 5\' 11"   (1.803 m), weight 77.111 kg (170 lb).Body mass index is 23.72 kg/(m^2).  General Appearance: Fairly Groomed  Patent attorneyye Contact::  Fair  Speech:  Clear and Coherent  Volume:  fluctuates  Mood:  Dysphoric  Affect:  Restricted  Thought Process:  Coherent and Goal Directed  Orientation:  Full (Time, Place, and Person)  Thought Content:  symptoms events worries concerns  Suicidal Thoughts:  Still does not want to live  Homicidal Thoughts:  No  Memory:  Immediate;   Fair Recent;   Fair Remote;   Fair  Judgement:  Fair  Insight:  Present  Psychomotor Activity:  Restlessness  Concentration:  Fair  Recall:  FiservFair  Fund of Knowledge:Fair  Language: Fair  Akathisia:  No  Handed:  Right  AIMS (if indicated):  Assets:  Desire for Improvement Social Support  ADL's:  Intact  Cognition: WNL  Sleep:  Number of Hours: 5   Treatment Plan Summary: Daily contact with patient to assess and evaluate symptoms and progress in treatment and Medication management Supportive approach/coping skills Alcohol cocaine dependence; continue to work a relapse prevention plan Mood instability; will continue the Lamictal and increase to 50 mg BID Depression; will continue the Wellbutrin XL 150 mg in AM with plans to increase to 300 mg as tolerated PTSD; will increase the Prazosin to 3 mg to help the nightmares. Will encourage to continue to address the trauma as he feels comfortable sharing the events he went trough.  Use CBT/mindfulness Kollin Udell A 08/13/2015, 5:56 PM

## 2015-08-14 MED ORDER — BUPROPION HCL ER (XL) 300 MG PO TB24
300.0000 mg | ORAL_TABLET | Freq: Every day | ORAL | Status: DC
  Administered 2015-08-15 – 2015-08-29 (×15): 300 mg via ORAL
  Filled 2015-08-14 (×18): qty 1

## 2015-08-14 NOTE — Plan of Care (Signed)
Problem: Alteration in mood Goal: STG-Patient is able to discuss feelings and issues (Patient is able to discuss feelings and issues leading to depression)  Outcome: Not Progressing Pt forwards little to staff, responds in brief statements, periods of elective mutism

## 2015-08-14 NOTE — Plan of Care (Signed)
Problem: Alteration in mood & ability to function due to Goal: STG-Patient will attend groups Outcome: Progressing Pt did attend evening AA group tonight     

## 2015-08-14 NOTE — Progress Notes (Signed)
D.  Pt in room on approach, no complaints voiced.  Pt was positive for evening AA group, minimal interaction this evening on the unit.  Denies SI/HI/halluicnations at this time.  A.  Support and encouragement offered, medications given as ordered  R.  Pt remains safe on the unit, will continue to monitor.

## 2015-08-14 NOTE — Progress Notes (Signed)
Pt attended the AA speaker meeting. Pt was appropriate in group. 

## 2015-08-14 NOTE — Progress Notes (Signed)
Patient ID: Curtis Blankenship, male   DOB: [birth year], 34 y.o.   MRN: [id number]  Adult Psychoeducational Group Note  Date:  08/14/2015 Time: 09:30am  Group Topic/Focus:  Making Healthy Choices:   The focus of this group is to help patients identify negative/unhealthy choices they were using prior to admission and identify positive/healthier coping strategies to replace them upon discharge.  Participation Level:  Did Not Attend  Participation Quality: n/a  Affect: n/a  Cognitive: n/a  Insight: n/a  Engagement in Group: n/a  Modes of Intervention:  Activity, Education and Support  Additional Comments:  Pt did not attend group. Pt awake.   [name], Eeva Schlosser E 08/14/2015, 10:03 AM

## 2015-08-14 NOTE — Progress Notes (Signed)
[facility]  08/14/2015 6:46 PM Curtis Blankenship  MRN:  [id number] Subjective:  "not much have changed" still feeling depressed, his mood unstable. He states he is wanting to have some hope for himself but cant. Had dreams of using last night, heroin. States his cravings are usually for alcohol Principal Problem: Severe recurrent major depression without psychotic features (HCC) Diagnosis:   Patient Active Problem List   Diagnosis Date Noted  . Uncomplicated alcohol dependence (HCC) [F10.20]   . Severe recurrent major depression without psychotic features (HCC) [F33.2] 08/04/2015  . Hyponatremia [E87.1] 08/02/2015  . Alcohol abuse [F10.10] 08/02/2015  . Suicidal ideation [R45.851] 08/02/2015  . Aspiration pneumonia (HCC) [J69.0] 08/02/2015  . Syncope [R55] 08/02/2015  . Blurred vision [H53.8]   . TIA (transient ischemic attack) [G45.9] 09/19/2013  . Disturbance of skin sensation [R20.9] 09/19/2013  . 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 Alcohol dependence (HCC) [F10.20] 12/01/2012  . Cocaine abuse [F14.10] 12/01/2012  . PTSD (post-traumatic stress disorder) [F43.10] 12/01/2012  . Depressive disorder [F32.9] 12/01/2012   Total Time spent with patient: 30 minutes  Past Psychiatric History: see admission H and P  Past Medical History:  Past Medical History  Diagnosis Date  . Bipolar disorder (HCC)   . PTSD (post-traumatic stress disorder)   . Depression     Past Surgical History  Procedure Laterality Date  . Appendectomy     Family History:  Family History  Problem Relation Age of Onset  . Hypertension Mother   . Alcoholism Other    Family Psychiatric  History: See admission H and P Social History:  History  Alcohol Use  . 97.2 oz/week  . 150 Cans of beer, 12 Shots of liquor per week    Comment: he drinks every day as much as he can     History  Drug Use  . Yes  . Special: Cocaine, Other-see comments, Benzodiazepines    Social History   Social History   . Marital Status: Single    Spouse Name: N/A  . Number of Children: N/A  . Years of Education: N/A   Social History Main Topics  . Smoking status: Current Every Day Smoker -- 1.50 packs/day for 10 years    Types: Cigarettes  . Smokeless tobacco: Former NeurosurgeonUser    Types: Snuff  . Alcohol Use: 97.2 oz/week    150 Cans of beer, 12 Shots of liquor per week     Comment: he drinks every day as much as he can  . Drug Use: Yes    Special: Cocaine, Other-see comments, Benzodiazepines  . Sexual Activity: Not Asked   Other Topics Concern  . None   Social History Narrative   Additional Social History:                         Sleep: Poor  Appetite:  Fair  Current Medications: Current Facility-Administered Medications  Medication Dose Route Frequency Provider Last Rate Last Dose  . alum & mag hydroxide-simeth (MAALOX/MYLANTA) 200-200-20 MG/5ML suspension 30 mL  30 mL Oral Q4H PRN Nelly RoutArchana Kumar, MD      . buPROPion (WELLBUTRIN XL) 24 hr tablet 150 mg  150 mg Oral Daily Rachael FeeIrving A Krishana Lutze, MD   150 mg at 08/14/15 0805  . hydrOXYzine (ATARAX/VISTARIL) tablet 50 mg  50 mg Oral QHS PRN Beau FannyJohn C Withrow, FNP   50 mg at 08/13/15 2209  . lamoTRIgine (LAMICTAL) tablet 50 mg  50 mg Oral BID  Rachael Fee, MD   50 mg at 08/14/15 1704  . magnesium hydroxide (MILK OF MAGNESIA) suspension 30 mL  30 mL Oral Daily PRN Nelly Rout, MD      . multivitamin with minerals tablet 1 tablet  1 tablet Oral Daily Kerry Hough, PA-C   1 tablet at 08/14/15 0805  . prazosin (MINIPRESS) capsule 3 mg  3 mg Oral QHS Rachael Fee, MD   3 mg at 08/13/15 2209  . thiamine (B-1) injection 100 mg  100 mg Intramuscular Once Kerry Hough, PA-C   100 mg at 08/03/15 2330  . thiamine (VITAMIN B-1) tablet 100 mg  100 mg Oral Daily Kerry Hough, PA-C   100 mg at 08/14/15 0805  . zolpidem (AMBIEN) tablet 10 mg  10 mg Oral QHS PRN Rachael Fee, MD   10 mg at 08/13/15 2210    Lab Results: No results found for this or  any previous visit (from the past 48 hour(s)).  Physical Findings: AIMS: Facial and Oral Movements Muscles of Facial Expression: None, normal Lips and Perioral Area: None, normal Jaw: None, normal Tongue: None, normal,Extremity Movements Upper (arms, wrists, hands, fingers): None, normal Lower (legs, knees, ankles, toes): None, normal, Trunk Movements Neck, shoulders, hips: None, normal, Overall Severity Severity of abnormal movements (highest score from questions above): None, normal Incapacitation due to abnormal movements: None, normal Patient's awareness of abnormal movements (rate only patient's report): No Awareness, Dental Status Current problems with teeth and/or dentures?: No Does patient usually wear dentures?: No  CIWA:  CIWA-Ar Total: 1 COWS:  COWS Total Score: 1  Musculoskeletal: Strength & Muscle Tone: within normal limits Gait & Station: normal Patient leans: normal  Psychiatric Specialty Exam: Review of Systems  Constitutional: Negative.   HENT: Negative.   Eyes: Negative.   Respiratory: Negative.   Cardiovascular: Negative.   Gastrointestinal: Negative.   Genitourinary: Negative.   Musculoskeletal: Negative.   Skin: Negative.   Neurological: Negative.   Endo/Heme/Allergies: Negative.   Psychiatric/Behavioral: Positive for depression and substance abuse. The patient is nervous/anxious and has insomnia.     Blood pressure 124/81, pulse 89, temperature 97.7 F (36.5 C), temperature source Oral, resp. rate 14, height  (1.803 m), weight 77.111 kg (170 lb).Body mass index is 23.72 kg/(m^2).  General Appearance: Fairly Groomed  Patent attorney::  Fair  Speech:  Clear and Coherent  Volume:  fluctuates  Mood:  Dysphoric  Affect:  Restricted  Thought Process:  Coherent and Goal Directed  Orientation:  Full (Time, Place, and Person)  Thought Content:  symptoms events worries concerns  Suicidal Thoughts:  does not want to live  Homicidal Thoughts:  No   Memory:  Immediate;   Fair Recent;   Fair Remote;   Fair  Judgement:  Fair  Insight:  Present  Psychomotor Activity:  Restlessness  Concentration:  Fair  Recall:  [employer] of Knowledge:Fair  Language: Fair  Akathisia:  No  Handed:  Right  AIMS (if indicated):     Assets:  Desire for Improvement  ADL's:  Intact  Cognition: WNL  Sleep:  Number of Hours: 6.25   Treatment Plan Summary: Daily contact with patient to assess and evaluate symptoms and progress in treatment and Medication management Supportive approach/coping skills Mood instability; will continue the Lamictal 50 mg BID Depression; will increase the Wellbutrin to XL 300 mg in AM Trauma; will continue to facilitate talking about the trauma as he feels comfortable Use CBT/mindfulness Avereigh Spainhower  A 08/14/2015, 6:46 PM

## 2015-08-14 NOTE — [facility] Group Notes (Signed)
[facility] Group Notes:  (Clinical Social Work)  08/14/2015  10:00-11:00AM  Summary of Progress/Problems:   The main focus of today's process group was to   1)  discuss the importance of adding supports  2)  define health supports versus unhealthy supports  3)  identify the patient's current unhealthy supports and plan how to handle them  4)  Identify the patient's current healthy supports and plan what to add.  An emphasis was placed on using counselor, doctor, therapy groups, 12-step groups, and problem-specific support groups to expand supports.    The patient expressed full comprehension of the concepts presented, and agreed that there is a need to add more supports.  The patient [facility] right now, and is [facility].  He was very patient in talking with another group member about that individual's resistant to treatment, stating that it took him a long time to admit that it is his own thinking that keeps [facility].  Type of Therapy:  Process Group with Motivational Interviewing  Participation Level:  Active  Participation Quality:  Attentive, Sharing and Supportive  Affect:  Blunted  Cognitive:  Alert and Oriented  Insight:  Engaged  Engagement in Therapy:  Engaged  Modes of Intervention:   Education, Support and Processing, Activity  Ambrose MantleMareida Grossman-Orr, LCSW 08/14/2015

## 2015-08-14 NOTE — Progress Notes (Signed)
Patient ID: Curtis Blankenship, male   DOB: 10-03-1981, 34 y.o.   MRN: [id number]   Pt currently presents with a flat affect and irritable behavior. Pt forwards little to staff. Per self inventory, pt rates depression, hopelessness and anxiety at a 5. Pt's daily goal is to "n/a" and they intend to do so by "n/a." Pt reports poor sleep, a fair appetite, low energy and poor concentration.   Pt provided with medications per providers orders. Pt's labs and vitals were monitored throughout the day. Pt supported emotionally and encouraged to express concerns and questions. Pt educated on medications.Pt does not attend groups today.   Pt's safety ensured with 15 minute and environmental checks. Pt currently denies SI/HI and A/V hallucinations. Pt verbally agrees to seek staff if SI/HI or A/VH occurs and to consult with staff before acting on these thoughts. Will continue POC.

## 2015-08-15 NOTE — Plan of Care (Signed)
Problem: Alteration in mood & ability to function due to Goal: LTG-Pt verbalizes understanding of importance of med regimen (Patient verbalizes understanding of importance of medication regimen and need to continue outpatient care and support groups)  Outcome: Progressing Pt medications are explained to him and verbalize understanding.

## 2015-08-15 NOTE — [facility] Group Notes (Signed)
Adult Psychoeducational Group Note  Date:  08/15/2015 Time:  10:02 PM  Group Topic/Focus:  AA Meeting  Participation Level:  Minimal  Participation Quality:  Attentive  Affect:  Flat  Cognitive:  Alert  Insight: Limited  Engagement in Group:  Limited  Modes of Intervention:  Discussion  Additional Comments:  Patient attended group.  Caroll RancherLindsay, Gabriel Conry A 08/15/2015, 10:02 PM

## 2015-08-15 NOTE — Progress Notes (Signed)
Pt attended group on loss and grief facilitated by counseling intern Graciela HusbandsKathryn Sebastian Dzik. Group goal of identifying grief patterns, naming feelings / responses to grief, identifying behaviors that may emerge from grief responses, identifying when one may call on an ally or coping skill.  Following introductions and group rules, group opened with psycho-social ed. identifying types of loss (relationships / self / things) and identifying patterns, circumstances, and changes that precipitate losses. Group members spoke about losses they had experienced and the effect of those losses on their lives. Facilitated sharing feelings and thoughts with one another in order to normalize grief responses, as well as recognize variety in grief experience.  Group facilitation drew on brief cognitive behavioral and Adlerian theory as well as Gestalt theory.  Pt presented as oriented x4 with generally appropriate affect and depressed mood. Pt participated when asked and appeared vaguely disoriented throughout group. He indicated that he is a veteran with PTSD and that he lacks empathy for others. He reported feeling "lost" and "stuck" in his memories. He reported that alcohol is his "buddy" and he does not know how he will feel some of the feelings he has buried without it.   Graciela HusbandsKathryn Jonathan Kirkendoll Counseling Intern

## 2015-08-15 NOTE — Progress Notes (Signed)
Patient ID: Maxcine HamRonald Wohler, male   DOB: 1981-10-08, 34 y.o.   MRN: [id number] PER STATE REGULATIONS 482.30  THIS CHART WAS REVIEWED FOR MEDICAL NECESSITY WITH RESPECT TO THE PATIENT'S ADMISSION/ DURATION OF STAY.  NEXT REVIEW DATE:  08/19/2015  Willa RoughJENNIFER JONES Vernor Monnig, RN, BSN CASE MANAGER

## 2015-08-15 NOTE — Progress Notes (Signed)
Recreation Therapy Notes  Date: 10.17.2016 Time: 9:30am Location: [address] Dayroom   Group Topic: Stress Management  Goal Area(s) Addresses:  Patient will actively participate in stress management techniques presented during session.   Behavioral Response: Did not attend.   Karlen Barbar L Edona Schreffler, LRT/CTRS        Ashish Rossetti L 08/15/2015 1:57 PM 

## 2015-08-15 NOTE — [facility] Group Notes (Signed)
[facility] LCSW Group Therapy  08/15/2015 11:56 AM  Type of Therapy:  Group Therapy  Participation Level:  Active  Participation Quality:  Attentive  Affect:  Appropriate  Cognitive:  Alert and Oriented  Insight:  Engaged  Engagement in Therapy:  Engaged  Modes of Intervention:  Confrontation, Discussion, Education, Exploration, Problem-solving, Rapport Building, Socialization and Support  Summary of Progress/Problems: Today's Topic: Overcoming Obstacles. Patients identified one short term goal and potential obstacles in reaching this goal. Patients processed barriers involved in overcoming these obstacles. Patients identified steps necessary for overcoming these obstacles and explored motivation (internal and external) for facing these difficulties head on. Windy FastRonald "Gerilyn PilgrimJacob" was attentive and engaged during today's processing group. He shared that his goal is to get [facility]. "I find out tomorrow if I get in." Gerilyn PilgrimJacob shared that he hopes to address his substance abuse issues before focusing on his PTSD issues. "I want [place] get [place] program [place] Gerilyn PilgrimJacob feels hopeful that he will be [facility] and is in good spirits today.   Smart, Derricka Mertz LCSWA  08/15/2015, 11:56 AM

## 2015-08-15 NOTE — [facility] Group Notes (Signed)
[facility]   08/15/2015 10:53 AM  Participation Quality:  Appropriate   Mood/Affect:  Irritable  Depression Rating:  "on the fence"   Anxiety Rating:  3  Thoughts of Suicide:  No Will you contract for safety?   NA  Current AVH:  No  Plan for Discharge/Comments:  Pt reports that he is overhearing night staff talk about him and is irritable regarding this. Pt states that the weekend was "uneventful." Pt [facility] and is expressing interest in PTSD program as well. [facility] and declined [facility].   Transportation Means: unknown at this time.   Supports: none identified by pt.   Smart, American FinancialHeather LCSWA

## 2015-08-15 NOTE — Progress Notes (Signed)
D: Pt continues to be very flat and depressed on the unit today. Pt also continues to be very isolative.Pt complained of not sleeping well to night because he could hear staff talk which kept him awake. Pt denied physical pain, but complained of being tired. Pt has been cooperative with his medication. Pt reported that his depression was a 0, his hopelessness was a 0, and that his anxiety was a 0. Pt reported being negative SI/HI, no AH/VH noted. A: 15 min checks continued for patient safety. R: Pts safety maintained.

## 2015-08-15 NOTE — Progress Notes (Signed)
[facility] MD Progress Note  08/15/2015 3:32 PM Curtis Blankenship  MRN:  [id number] Subjective:  Curtis Blankenship continues to have a hard time. He has taken hard the perceived non support from his mother. He describes not being closed to his oldest brother or his father and occasionally close to his sister if she was not to be in a relationship. He states he does well when there is structure and accountability. " I think I need a baby sitter." continues to have a lot of negative self perceptions shame guilt regrets. Has identified the tendency for his thinking to be "all or nothing" continues to evidence the underlying hopelessness. Reassured when the result of the CT Scan and the MRI was read to him. States he was really scared of being left permanently incapacitated with a stroke. Principal Problem: Severe recurrent major depression without psychotic features (HCC) Diagnosis:   Patient Active Problem List   Diagnosis Date Noted  . Uncomplicated alcohol dependence (HCC) [F10.20]   . Severe recurrent major depression without psychotic features (HCC) [F33.2] 08/04/2015  . Hyponatremia [E87.1] 08/02/2015  . Alcohol abuse [F10.10] 08/02/2015  . Suicidal ideation [R45.851] 08/02/2015  . Aspiration pneumonia (HCC) [J69.0] 08/02/2015  . Syncope [R55] 08/02/2015  . Blurred vision [H53.8]   . TIA (transient ischemic attack) [G45.9] 09/19/2013  . Disturbance of skin sensation [R20.9] 09/19/2013  . 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 Alcohol dependence (HCC) [F10.20] 12/01/2012  . Cocaine abuse [F14.10] 12/01/2012  . PTSD (post-traumatic stress disorder) [F43.10] 12/01/2012  . Depressive disorder [F32.9] 12/01/2012   Total Time spent with patient: 30 minutes  Past Psychiatric History: see admission H and P  Past Medical History:  Past Medical History  Diagnosis Date  . Bipolar disorder (HCC)   . PTSD (post-traumatic stress disorder)   . Depression     Past Surgical History  Procedure Laterality Date  .  Appendectomy     Family History:  Family History  Problem Relation Age of Onset  . Hypertension Mother   . Alcoholism Other    Family Psychiatric  History: see H and P Social History:  History  Alcohol Use  . 97.2 oz/week  . 150 Cans of beer, 12 Shots of liquor per week    Comment: he drinks every day as much as he can     History  Drug Use  . Yes  . Special: Cocaine, Other-see comments, Benzodiazepines    Social History   Social History  . Marital Status: Single    Spouse Name: N/A  . Number of Children: N/A  . Years of Education: N/A   Social History Main Topics  . Smoking status: Current Every Day Smoker -- 1.50 packs/day for 10 years    Types: Cigarettes  . Smokeless tobacco: Former Neurosurgeon    Types: Snuff  . Alcohol Use: 97.2 oz/week    150 Cans of beer, 12 Shots of liquor per week     Comment: he drinks every day as much as he can  . Drug Use: Yes    Special: Cocaine, Other-see comments, Benzodiazepines  . Sexual Activity: Not Asked   Other Topics Concern  . None   Social History Narrative   Additional Social History:                         Sleep: Fair  Appetite:  Fair  Current Medications: Current Facility-Administered Medications  Medication Dose Route Frequency Provider Last Rate Last Dose  . alum &  mag hydroxide-simeth (MAALOX/MYLANTA) 200-200-20 MG/5ML suspension 30 mL  30 mL Oral Q4H PRN Nelly Rout, MD      . buPROPion (WELLBUTRIN XL) 24 hr tablet 300 mg  300 mg Oral Daily Rachael Fee, MD   300 mg at 08/15/15 0758  . hydrOXYzine (ATARAX/VISTARIL) tablet 50 mg  50 mg Oral QHS PRN Beau Fanny, FNP   50 mg at 08/14/15 2303  . lamoTRIgine (LAMICTAL) tablet 50 mg  50 mg Oral BID Rachael Fee, MD   50 mg at 08/15/15 0758  . magnesium hydroxide (MILK OF MAGNESIA) suspension 30 mL  30 mL Oral Daily PRN Nelly Rout, MD      . multivitamin with minerals tablet 1 tablet  1 tablet Oral Daily Kerry Hough, PA-C   1 tablet at 08/15/15  0758  . prazosin (MINIPRESS) capsule 3 mg  3 mg Oral QHS Rachael Fee, MD   3 mg at 08/14/15 2303  . thiamine (B-1) injection 100 mg  100 mg Intramuscular Once Kerry Hough, PA-C   100 mg at 08/03/15 2330  . thiamine (VITAMIN B-1) tablet 100 mg  100 mg Oral Daily Kerry Hough, PA-C   100 mg at 08/15/15 1308  . zolpidem (AMBIEN) tablet 10 mg  10 mg Oral QHS PRN Rachael Fee, MD   10 mg at 08/14/15 2303    Lab Results: No results found for this or any previous visit (from the past 48 hour(s)).  Physical Findings: AIMS: Facial and Oral Movements Muscles of Facial Expression: None, normal Lips and Perioral Area: None, normal Jaw: None, normal Tongue: None, normal,Extremity Movements Upper (arms, wrists, hands, fingers): None, normal Lower (legs, knees, ankles, toes): None, normal, Trunk Movements Neck, shoulders, hips: None, normal, Overall Severity Severity of abnormal movements (highest score from questions above): None, normal Incapacitation due to abnormal movements: None, normal Patient's awareness of abnormal movements (rate only patient's report): No Awareness, Dental Status Current problems with teeth and/or dentures?: No Does patient usually wear dentures?: No  CIWA:  CIWA-Ar Total: 1 COWS:  COWS Total Score: 1  Musculoskeletal: Strength & Muscle Tone: within normal limits Gait & Station: normal Patient leans: normal  Psychiatric Specialty Exam: Review of Systems  Constitutional: Negative.   HENT: Negative.   Eyes: Negative.   Respiratory: Negative.   Cardiovascular: Negative.   Gastrointestinal: Negative.   Genitourinary: Negative.   Musculoskeletal: Negative.   Skin: Negative.   Neurological: Negative.   Endo/Heme/Allergies: Negative.   Psychiatric/Behavioral: Positive for depression and substance abuse. The patient is nervous/anxious and has insomnia.     Blood pressure 118/76, pulse 92, temperature 97.6 F (36.4 C), temperature source Oral, resp. rate  18, height  (1.803 m), weight 77.111 kg (170 lb).Body mass index is 23.72 kg/(m^2).  General Appearance: Fairly Groomed  Patent attorney::  Fair  Speech:  Clear and Coherent  Volume:  fluctuates  Mood:  Anxious, Depressed and Dysphoric  Affect:  Restricted  Thought Process:  Coherent and Goal Directed  Orientation:  Full (Time, Place, and Person)  Thought Content:  symptoms events worries concerns  Suicidal Thoughts:  Not today  Homicidal Thoughts:  No  Memory:  Immediate;   Fair Recent;   Fair Remote;   Fair  Judgement:  Fair  Insight:  Present  Psychomotor Activity:  Restlessness  Concentration:  Fair  Recall:  [employer] of Knowledge:Fair  Language: Fair  Akathisia:  No  Handed:  Right  AIMS (if indicated):  Assets:  Desire for Improvement  ADL's:  Intact  Cognition: WNL  Sleep:  Number of Hours: 5.25   Treatment Plan Summary: Daily contact with patient to assess and evaluate symptoms and progress in treatment and Medication management Supportive approach/coping skills Alcohol cocaine dependence; continue to work a relapse prevention plan Mood instability; continue to work with the Lamictal 50 mg BID, consider increasing the dose  Depression; assess the increase of Wellbutrin this morning for side effects PTSD; continue to facilitate talking and help process the trauma he experienced Facilitate admission to a [facility] residential treatment program Use CBT/mindfulness Daysean Tinkham A 08/15/2015, 3:32 PM

## 2015-08-16 NOTE — [facility] Group Notes (Signed)
Pt stated his day was stressful and unproductive because [place].  His goal was to find out about what was going [place] with [place].  Curtis RancherMarjette Dewaine Blankenship, MHT

## 2015-08-16 NOTE — [facility] Group Notes (Signed)
[facility] LCSW Group Therapy  08/16/2015 1:02 PM  Type of Therapy:  Group Therapy  Participation Level:  Active  Participation Quality:  Attentive  Affect:  Appropriate  Cognitive:  Alert and Oriented  Insight:  Engaged  Engagement in Therapy:  Engaged  Modes of Intervention:  Discussion, Education, Exploration, Problem-solving, Rapport Building, Socialization and Support  Summary of Progress/Problems: [facility] Speaker came to talk about his personal journey with substance abuse and addiction. The pt processed ways by which to relate to the speaker. [facility]HA speaker provided handouts and educational information pertaining to groups and services offered by the [facility].    Blankenship, Curtis Meenan  LCSWA   08/16/2015, 1:02 PM

## 2015-08-16 NOTE — Progress Notes (Signed)
D: Patient alert and oriented x 4. Patient denies pain/SI/HI/AVH. Patient requested Ambien at bedtime. PRN was given per orders with effective results. Patient is currently resting in bed with eyes closed.  A: Staff to monitor Q 15 mins for safety. Encouragement and support offered. Scheduled medications administered per orders. R: Patient remains safe on the unit. Patient attended group tonight. Patient visible on hte unit and interacting with peers. Patient taking administered medications.

## 2015-08-16 NOTE — Progress Notes (Signed)
Recreation Therapy Notes  Animal-Assisted Activity (AAA) Program Checklist/Progress Notes Patient Eligibility Criteria Checklist & Daily Group note for Rec Tx Intervention  Date: 10.18.2016  Time: 2:45pm Location: [address] Dayroom    AAA/T Program Assumption of Risk Form signed by Patient/ or Parent Legal Guardian yes  Patient is free of allergies or sever asthma yes  Patient reports no fear of animals yes  Patient reports no history of cruelty to animals yes  Patient understands his/her participation is voluntary yes  Behavioral Response: Did not attend.   Shantese Raven L Henretta Quist, LRT/CTRS  Kinan Safley L 08/16/2015 3:07 PM 

## 2015-08-16 NOTE — Progress Notes (Signed)
[facility] MD Progress Note  08/16/2015 5:22 PM Curtis Blankenship  MRN:  [id number] Subjective:  Curtis Blankenship is still struggling. He is still trying to get his life back together but admits he is to a point of giving up. He states he feels the [place] has gotten tired of him as well as his family. States he does not blame anybody as all of this has been his own fault. Still with a sense of hopelessness helplessness. He has not heard from the [place] so he he is already acting on his catastrophic thinking expecting the worst possible outcome ready to give up Principal Problem: Severe recurrent major depression without psychotic features (HCC) Diagnosis:   Patient Active Problem List   Diagnosis Date Noted  . Uncomplicated alcohol dependence (HCC) [F10.20]   . Severe recurrent major depression without psychotic features (HCC) [F33.2] 08/04/2015  . Hyponatremia [E87.1] 08/02/2015  . Alcohol abuse [F10.10] 08/02/2015  . Suicidal ideation [R45.851] 08/02/2015  . Aspiration pneumonia (HCC) [J69.0] 08/02/2015  . Syncope [R55] 08/02/2015  . Blurred vision [H53.8]   . TIA (transient ischemic attack) [G45.9] 09/19/2013  . Disturbance of skin sensation [R20.9] 09/19/2013  . 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 Alcohol dependence (HCC) [F10.20] 12/01/2012  . Cocaine abuse [F14.10] 12/01/2012  . PTSD (post-traumatic stress disorder) [F43.10] 12/01/2012  . Depressive disorder [F32.9] 12/01/2012   Total Time spent with patient: 30 minutes  Past Psychiatric History: see admission H and P  Past Medical History:  Past Medical History  Diagnosis Date  . Bipolar disorder (HCC)   . PTSD (post-traumatic stress disorder)   . Depression     Past Surgical History  Procedure Laterality Date  . Appendectomy     Family History:  Family History  Problem Relation Age of Onset  . Hypertension Mother   . Alcoholism Other    Family Psychiatric  History: See admission H and P Social History:  History  Alcohol Use  . 97.2  oz/week  . 150 Cans of beer, 12 Shots of liquor per week    Comment: he drinks every day as much as he can     History  Drug Use  . Yes  . Special: Cocaine, Other-see comments, Benzodiazepines    Social History   Social History  . Marital Status: Single    Spouse Name: N/A  . Number of Children: N/A  . Years of Education: N/A   Social History Main Topics  . Smoking status: Current Every Day Smoker -- 1.50 packs/day for 10 years    Types: Cigarettes  . Smokeless tobacco: Former Neurosurgeon    Types: Snuff  . Alcohol Use: 97.2 oz/week    150 Cans of beer, 12 Shots of liquor per week     Comment: he drinks every day as much as he can  . Drug Use: Yes    Special: Cocaine, Other-see comments, Benzodiazepines  . Sexual Activity: Not Asked   Other Topics Concern  . None   Social History Narrative   Additional Social History:                         Sleep: Fair  Appetite:  Fair  Current Medications: Current Facility-Administered Medications  Medication Dose Route Frequency Provider Last Rate Last Dose  . alum & mag hydroxide-simeth (MAALOX/MYLANTA) 200-200-20 MG/5ML suspension 30 mL  30 mL Oral Q4H PRN Nelly Rout, MD      . buPROPion (WELLBUTRIN XL) 24 hr tablet 300 mg  300 mg Oral Daily Rachael Fee, MD   300 mg at 08/16/15 4098  . hydrOXYzine (ATARAX/VISTARIL) tablet 50 mg  50 mg Oral QHS PRN Beau Fanny, FNP   50 mg at 08/15/15 2227  . lamoTRIgine (LAMICTAL) tablet 50 mg  50 mg Oral BID Rachael Fee, MD   50 mg at 08/16/15 1657  . magnesium hydroxide (MILK OF MAGNESIA) suspension 30 mL  30 mL Oral Daily PRN Nelly Rout, MD      . multivitamin with minerals tablet 1 tablet  1 tablet Oral Daily Kerry Hough, PA-C   1 tablet at 08/16/15 1191  . prazosin (MINIPRESS) capsule 3 mg  3 mg Oral QHS Rachael Fee, MD   3 mg at 08/15/15 2227  . thiamine (B-1) injection 100 mg  100 mg Intramuscular Once Kerry Hough, PA-C   100 mg at 08/03/15 2330  . thiamine  (VITAMIN B-1) tablet 100 mg  100 mg Oral Daily Kerry Hough, PA-C   100 mg at 08/16/15 4782  . zolpidem (AMBIEN) tablet 10 mg  10 mg Oral QHS PRN Rachael Fee, MD   10 mg at 08/15/15 2227    Lab Results: No results found for this or any previous visit (from the past 48 hour(s)).  Physical Findings: AIMS: Facial and Oral Movements Muscles of Facial Expression: None, normal Lips and Perioral Area: None, normal Jaw: None, normal Tongue: None, normal,Extremity Movements Upper (arms, wrists, hands, fingers): None, normal Lower (legs, knees, ankles, toes): None, normal, Trunk Movements Neck, shoulders, hips: None, normal, Overall Severity Severity of abnormal movements (highest score from questions above): None, normal Incapacitation due to abnormal movements: None, normal Patient's awareness of abnormal movements (rate only patient's report): No Awareness, Dental Status Current problems with teeth and/or dentures?: No Does patient usually wear dentures?: No  CIWA:  CIWA-Ar Total: 1 COWS:  COWS Total Score: 1  Musculoskeletal: Strength & Muscle Tone: within normal limits Gait & Station: normal Patient leans: normal  Psychiatric Specialty Exam: Review of Systems  Constitutional: Negative.   HENT: Negative.   Eyes: Negative.   Respiratory: Negative.   Cardiovascular: Negative.   Gastrointestinal: Negative.   Genitourinary: Negative.   Musculoskeletal: Negative.   Skin: Negative.   Neurological: Negative.   Endo/Heme/Allergies: Negative.   Psychiatric/Behavioral: Positive for depression and substance abuse. The patient is nervous/anxious.     Blood pressure 120/81, pulse 72, temperature 98.2 F (36.8 C), temperature source Oral, resp. rate 18, height  (1.803 m), weight 77.111 kg (170 lb).Body mass index is 23.72 kg/(m^2).  General Appearance: Fairly Groomed  Patent attorney::  Fair  Speech:  Clear and Coherent  Volume:  fluctuates   Mood:  Anxious, Depressed, Dysphoric  and worried  Affect:  Appropriate  Thought Process:  Coherent and Goal Directed  Orientation:  Full (Time, Place, and Person)  Thought Content:  symptoms events worries concerns  Suicidal Thoughts:  Still not wanting to be alive anymore   Homicidal Thoughts:  No  Memory:  Immediate;   Fair Recent;   Fair Remote;   Fair  Judgement:  Fair  Insight:  Present  Psychomotor Activity:  Restlessness  Concentration:  Fair  Recall:  [employer] of Knowledge:Fair  Language: Fair  Akathisia:  No  Handed:  Right  AIMS (if indicated):     Assets:  Desire for Improvement  ADL's:  Intact  Cognition: WNL  Sleep:  Number of Hours: 6.75   Treatment Plan  Summary: Daily contact with patient to assess and evaluate symptoms and progress in treatment and Medication management Supportive approach/coping skills Alcohol cocaine dependence; continue to work a relapse prevention plan Depression; will continue the Wellbutrin XL 300 mg in AM Mood instability; will continue to optimize response to the Lamictal now up to 50 mg BID PTSD; continue the Minipress 3 mg daily Continue to explore residential treatment options  Use CBT/mindfulness  Catherine Oak A 08/16/2015, 5:22 PM

## 2015-08-16 NOTE — [facility] Group Notes (Signed)
[facility] Group Notes:  Recovery  Date:  08/16/2015  Time:  9:44 AM  Type of Therapy:  Nurse Education  Participation Level:  Did Not Attend  Participation Quality:  Inattentive  Affect:  Flat  Cognitive:  Lacking  Insight:  None  Engagement in Group:  None  Modes of Intervention:  Discussion  Summary of Progress/Problems:Pt did not attend  Rodman KeyWebb, [facility] 08/16/2015, 9:44 AM

## 2015-08-16 NOTE — Clinical Social Work Note (Signed)
CSW left message for Curtis Blankenship regarding pt's referral that was scheduled for review today. Requested that she contact CSW at her earliest convenience.   Trula SladeHeather Smart, LCSWA Clinical Social Worker 08/16/2015 3:57 PM

## 2015-08-16 NOTE — Progress Notes (Signed)
Nursing Note: 0700-1900  D:  Mood is depressed. Affect flat initially this am, but noted brightening throughout shift and engagement with peers in milieu.  Self- Inventory: depression 5 /10, hopelessness 2/10 and anxiety 1/10. Pt states, "[facility] like [facility], I hope I get in, I am starting to worry that they don't want me."  "I don't know how to live life without help, I need structure everyday to stay away from dirt (substances)."   A:  Encouraged to verbalize needs and concerns, active listening and support provided.  Meds taken as prescribed, no prns requested.  Continued Q 15 minute safety checks.  Noted participation in afternoon group activities.  R:  Pt. denies A/V hallucinations, SI/HI and is able to verbally contract for safety.

## 2015-08-17 NOTE — [facility] Group Notes (Signed)
[facility] LCSW Group Therapy  08/17/2015 3:44 PM  Type of Therapy:  Group Therapy  Participation Level:  Active  Participation Quality:  Attentive  Affect:  Appropriate  Cognitive:  Alert and Oriented  Insight:  Engaged  Engagement in Therapy:  Engaged  Modes of Intervention:  Confrontation, Discussion, Education, Exploration, Problem-solving, Rapport Building, Socialization and Support  Summary of Progress/Problems: Emotion Regulation: This group focused on both positive and negative emotion identification and allowed group members to process ways to identify feelings, regulate negative emotions, and find healthy ways to manage internal/external emotions. Group members were asked to reflect on a time when their reaction to an emotion led to a negative outcome and explored how alternative responses using emotion regulation would have benefited them. Group members were also asked to discuss a time when emotion regulation was utilized when a negative emotion was experienced. Curtis Blankenship "Curtis Blankenship" was attentive and engaged. He shared that he is struggling with anger and mood instability today due to his [facility]. Curtis Blankenship stated that he impulsively wanted to d/c this morning to drink a beer when he got bad news but after calming down, was able to problem solve with MD and CSW to think of other options. Curtis Blankenship continues to demonstrate improving insight.   Smart, Kida Digiulio LCSWA  08/17/2015, 3:44 PM

## 2015-08-17 NOTE — Clinical Social Work Note (Signed)
Lajoyce Cornersvy Dey-Johnson from [facility] is requesting additional progress notes on patient from Sunday until today. They will rescreen patient today at 1:00PM.  Trula SladeHeather Smart, LCSWA Clinical Social Worker 08/17/2015 10:32 AM

## 2015-08-17 NOTE — Progress Notes (Signed)
D: Patient alert and oriented x 4. Patient denies pain/SI/HI/AVH. Patient states he is still waiting to hear from the [facility]. But states, "No news is good news." A: Staff to monitor Q 15 mins for safety. Encouragement and support offered. Scheduled medications administered per orders. R: Patient remains safe on the unit. Patient attended group tonight. Patient visible on hte unit and interacting with peers. Patient taking administered medications.

## 2015-08-17 NOTE — [facility] Group Notes (Signed)
[facility]   08/17/2015 9:29 AM  Participation Quality:  Appropriate   Mood/Affect:  Irritable  Depression Rating:  5  Anxiety Rating:  5  Thoughts of Suicide:  No Will you contract for safety?   NA  Current AVH:  No  Plan for Discharge/Comments:  CSW notified that [facility]. "Just d/c me. I need a beer." CSW attempted to discuss other options. Pt irritable and demanding d/c. CSW called Ivy back from [facility]. Asking for additional progress notes and will reevaluate him today at 1pm.    Transportation Means: unknown at this time.   Supports: none identified.   Smart, American FinancialHeather LCSWA

## 2015-08-17 NOTE — Progress Notes (Signed)
[facility] MD Progress Note  08/17/2015 6:48 PM Curtis Blankenship  MRN:  [id number] Subjective:  Curtis Blankenship is frustrated as he has not gotten the [place] agree to get him a residential treatment bed. States he needs the help. He needs to get his addiction taken care of before he persues addressing his PTSD.  Principal Problem: Severe recurrent major depression without psychotic features (HCC) Diagnosis:   Patient Active Problem List   Diagnosis Date Noted  . Uncomplicated alcohol dependence (HCC) [F10.20]   . Severe recurrent major depression without psychotic features (HCC) [F33.2] 08/04/2015  . Hyponatremia [E87.1] 08/02/2015  . Alcohol abuse [F10.10] 08/02/2015  . Suicidal ideation [R45.851] 08/02/2015  . Aspiration pneumonia (HCC) [J69.0] 08/02/2015  . Syncope [R55] 08/02/2015  . Blurred vision [H53.8]   . TIA (transient ischemic attack) [G45.9] 09/19/2013  . Disturbance of skin sensation [R20.9] 09/19/2013  . 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 Alcohol dependence (HCC) [F10.20] 12/01/2012  . Cocaine abuse [F14.10] 12/01/2012  . PTSD (post-traumatic stress disorder) [F43.10] 12/01/2012  . Depressive disorder [F32.9] 12/01/2012   Total Time spent with patient: 30 minutes  Past Psychiatric History: see Admission H and P  Past Medical History:  Past Medical History  Diagnosis Date  . Bipolar disorder (HCC)   . PTSD (post-traumatic stress disorder)   . Depression     Past Surgical History  Procedure Laterality Date  . Appendectomy     Family History:  Family History  Problem Relation Age of Onset  . Hypertension Mother   . Alcoholism Other    Family Psychiatric  History: See admission H and P Social History:  History  Alcohol Use  . 97.2 oz/week  . 150 Cans of beer, 12 Shots of liquor per week    Comment: he drinks every day as much as he can     History  Drug Use  . Yes  . Special: Cocaine, Other-see comments, Benzodiazepines    Social History   Social History  .  Marital Status: Single    Spouse Name: N/A  . Number of Children: N/A  . Years of Education: N/A   Social History Main Topics  . Smoking status: Current Every Day Smoker -- 1.50 packs/day for 10 years    Types: Cigarettes  . Smokeless tobacco: Former Neurosurgeon    Types: Snuff  . Alcohol Use: 97.2 oz/week    150 Cans of beer, 12 Shots of liquor per week     Comment: he drinks every day as much as he can  . Drug Use: Yes    Special: Cocaine, Other-see comments, Benzodiazepines  . Sexual Activity: Not Asked   Other Topics Concern  . None   Social History Narrative   Additional Social History:                         Sleep: Fair  Appetite:  Fair  Current Medications: Current Facility-Administered Medications  Medication Dose Route Frequency Provider Last Rate Last Dose  . alum & mag hydroxide-simeth (MAALOX/MYLANTA) 200-200-20 MG/5ML suspension 30 mL  30 mL Oral Q4H PRN Nelly Rout, MD      . buPROPion (WELLBUTRIN XL) 24 hr tablet 300 mg  300 mg Oral Daily Rachael Fee, MD   300 mg at 08/17/15 0751  . hydrOXYzine (ATARAX/VISTARIL) tablet 50 mg  50 mg Oral QHS PRN Beau Fanny, FNP   50 mg at 08/15/15 2227  . lamoTRIgine (LAMICTAL) tablet 50 mg  50  mg Oral BID Rachael Fee, MD   50 mg at 08/17/15 1650  . magnesium hydroxide (MILK OF MAGNESIA) suspension 30 mL  30 mL Oral Daily PRN Nelly Rout, MD      . multivitamin with minerals tablet 1 tablet  1 tablet Oral Daily Kerry Hough, PA-C   1 tablet at 08/17/15 0751  . prazosin (MINIPRESS) capsule 3 mg  3 mg Oral QHS Rachael Fee, MD   3 mg at 08/16/15 2231  . thiamine (B-1) injection 100 mg  100 mg Intramuscular Once Kerry Hough, PA-C   100 mg at 08/03/15 2330  . thiamine (VITAMIN B-1) tablet 100 mg  100 mg Oral Daily Kerry Hough, PA-C   100 mg at 08/17/15 1610  . zolpidem (AMBIEN) tablet 10 mg  10 mg Oral QHS PRN Rachael Fee, MD   10 mg at 08/16/15 2231    Lab Results: No results found for this or any  previous visit (from the past 48 hour(s)).  Physical Findings: AIMS: Facial and Oral Movements Muscles of Facial Expression: None, normal Lips and Perioral Area: None, normal Jaw: None, normal Tongue: None, normal,Extremity Movements Upper (arms, wrists, hands, fingers): None, normal Lower (legs, knees, ankles, toes): None, normal, Trunk Movements Neck, shoulders, hips: None, normal, Overall Severity Severity of abnormal movements (highest score from questions above): None, normal Incapacitation due to abnormal movements: None, normal Patient's awareness of abnormal movements (rate only patient's report): No Awareness, Dental Status Current problems with teeth and/or dentures?: No Does patient usually wear dentures?: No  CIWA:  CIWA-Ar Total: 1 COWS:  COWS Total Score: 1  Musculoskeletal: Strength & Muscle Tone: within normal limits Gait & Station: normal Patient leans: normal  Psychiatric Specialty Exam: Review of Systems  Constitutional: Negative.   HENT: Negative.   Eyes: Negative.   Respiratory: Negative.   Cardiovascular: Negative.   Gastrointestinal: Negative.   Genitourinary: Negative.   Musculoskeletal: Negative.   Skin: Negative.   Neurological: Negative.   Endo/Heme/Allergies: Negative.   Psychiatric/Behavioral: Positive for substance abuse. The patient is nervous/anxious.     Blood pressure 121/91, pulse 91, temperature 98.6 F (37 C), temperature source Oral, resp. rate 18, height  (1.803 m), weight 77.111 kg (170 lb).Body mass index is 23.72 kg/(m^2).  General Appearance: Fairly Groomed  Patent attorney::  Fair  Speech:  Clear and Coherent  Volume:  fluctuates  Mood:  Anxious and worried  Affect:  anxious  Thought Process:  Coherent and Goal Directed  Orientation:  Full (Time, Place, and Person)  Thought Content:  symptoms events worries concerns  Suicidal Thoughts:  No  Homicidal Thoughts:  No  Memory:  Immediate;   Fair Recent;   Fair Remote;    Fair  Judgement:  Fair  Insight:  Present  Psychomotor Activity:  Restlessness  Concentration:  Fair  Recall:  [employer] of Knowledge:Fair  Language: Fair  Akathisia:  No  Handed:  Right  AIMS (if indicated):     Assets:  Desire for Improvement  ADL's:  Intact  Cognition: WNL  Sleep:  Number of Hours: 6   Treatment Plan Summary: Daily contact with patient to assess and evaluate symptoms and progress in treatment and Medication management Supportive approach/coping skills Alcohol dependence/cocaine abuse; continue to work a relapse prevention plan Depression; continue to work with the Wellbutrin XL 300 mg in AM Mood instability; continue to work with the Lamictal 50 mg BID Will work with CBT/mindfulness [facility] [place] [facility] [place], he expresses readiness to do the work and being afraid of a negative outcome if he does not address his addiction at this time Natsha Guidry A 08/17/2015, 6:48 PM

## 2015-08-17 NOTE — Progress Notes (Signed)
Patient initially pleasant this morning. Up and visible in milieu. Complains of anxiety and rates it at an 8/10. Depression is a 2/10, hopelessness at a 5/10. Patient increasingly agitated, mood labile after learning his discharge plan [facility] does not seem possible. Patient redirected as needed. Attempted to offer support. Medicated per orders. He denies SI/HI/AVH and remains safe at this time. Lawrence MarseillesFriedman, Elianny Buxbaum Eakes

## 2015-08-17 NOTE — Plan of Care (Signed)
Problem: Diagnosis: Increased Risk For Suicide Attempt Goal: STG-Patient Will Report Suicidal Feelings to Staff Outcome: Progressing Patient denying SI, denies thoughts to self harm.  Problem: Ineffective individual coping Goal: STG: Patient will participate in after care plan Outcome: Progressing Patient is participating in plans, attempts to secure treatment through the [facility].

## 2015-08-18 NOTE — Progress Notes (Signed)
Pt currently presents with a flat affect and irritable behavior. Per self inventory, pt rates depression, hopelessness and anxiety at a 5. Pt's daily goal is to "n/a" and they intend to do so by "n/a." Pt reports fair sleep, a fair appetite, low energy and poor concentration. Pt reports to tech, "No, no, no, I'm not suicidal." Pt does not make eye contact with staff and walks away when conversation is initiated.   Pt provided with medications per providers orders. Pt's labs and vitals were monitored throughout the day. Writer tried to engage pt in conversation about discharge plans.   Pt's safety ensured with 15 minute and environmental checks. Pt currently denies SI/HI and A/V hallucinations. Pt verbally agrees to seek staff if SI/HI or A/VH occurs and to consult with staff before acting on these thoughts. Will continue POC.

## 2015-08-18 NOTE — [facility] Group Notes (Signed)
[facility] LCSW Group Therapy  08/18/2015 2:36 PM  Type of Therapy:  Group Therapy  Participation Level:  Active  Participation Quality:  Appropriate and Attentive  Affect:  Appropriate  Cognitive:  Alert, Appropriate and Oriented  Insight:  Improving  Engagement in Therapy:  Engaged  Modes of Intervention:  Discussion, Exploration, Reality Testing and Support   Finding Balance in Life. Today's group focused on defining balance in one's own words, identifying things that can knock one off balance, and exploring healthy ways to maintain balance in life. Group members were asked to provide an example of a time when they felt off balance, describe how they handled that situation, and process healthier ways to regain balance in the future. Group members were asked to share the most important tool for maintaining balance that they [facility] and how they plan to apply this method after discharge. Finding Balance in Life. Today's group focused on defining balance in one's own words, identifying things that can knock one off balance, and exploring healthy ways to maintain balance in life. Group members were asked to provide an example of a time when they felt off balance, describe how they handled that situation, and process healthier ways to regain balance in the future. Group members were asked to share the most important tool for maintaining balance that they [facility] and how they plan to apply this method after discharge.   Summary of Progress/Problems: Patient was thoughtful throughout group, described his life as "like walking a tightrope, I have to work so hard not to fall."  Peer reminded him that he uses reading as a coping mechanism, patient states that he needs solitude to feel in balance.  Expressed that he has been thinking about various relationships and realized during group that some are not healthy.  Patient described some of his relationships as being one-sided, taking  more out of him than he receives.    Sallee LangeCunningham, Curtis Blankenship 08/18/2015, 2:36 PM

## 2015-08-18 NOTE — [facility] Group Notes (Signed)
[facility] Group Notes:  (Nursing/MHT/Case Management/Adjunct)  Date:  08/18/2015  Time:  9:28 AM  Type of Therapy:  Nurse Education  Participation Level:  Did Not Attend  Participation Quality:    Affect:    Cognitive:    Insight:    Engagement in Group:    Modes of Intervention:    Summary of Progress/Problems: Pt did not attend group although he was encouraged to do so.   Maurine SimmeringShugart, Rivkah Wolz M 08/18/2015, 9:28 AM

## 2015-08-18 NOTE — Progress Notes (Signed)
Pt did not attend karaoke group this evening.  

## 2015-08-18 NOTE — Progress Notes (Signed)
Pt attended NA group this evening.  

## 2015-08-18 NOTE — Progress Notes (Signed)
[facility] MD Progress Note  08/18/2015 3:14 PM Curtis Blankenship  MRN:  [id number] Subjective:  Curtis Blankenship was told [facility] into their residential treatment program in [place]. He was given some options of where to stay. He is overall better than when he was admitted. He is still concerned about not having a structure while he is trying to get himself together. Has has some nightmares. He states they are "good" as he gets to see his peers even is in the dreams Principal Problem: Severe recurrent major depression without psychotic features (HCC) Diagnosis:   Patient Active Problem List   Diagnosis Date Noted  . Uncomplicated alcohol dependence (HCC) [F10.20]   . Severe recurrent major depression without psychotic features (HCC) [F33.2] 08/04/2015  . Hyponatremia [E87.1] 08/02/2015  . Alcohol abuse [F10.10] 08/02/2015  . Suicidal ideation [R45.851] 08/02/2015  . Aspiration pneumonia (HCC) [J69.0] 08/02/2015  . Syncope [R55] 08/02/2015  . Blurred vision [H53.8]   . TIA (transient ischemic attack) [G45.9] 09/19/2013  . Disturbance of skin sensation [R20.9] 09/19/2013  . 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 Alcohol dependence (HCC) [F10.20] 12/01/2012  . Cocaine abuse [F14.10] 12/01/2012  . PTSD (post-traumatic stress disorder) [F43.10] 12/01/2012  . Depressive disorder [F32.9] 12/01/2012   Total Time spent with patient: 20 minutes  Past Psychiatric History: see Admission H and P  Past Medical History:  Past Medical History  Diagnosis Date  . Bipolar disorder (HCC)   . PTSD (post-traumatic stress disorder)   . Depression     Past Surgical History  Procedure Laterality Date  . Appendectomy     Family History:  Family History  Problem Relation Age of Onset  . Hypertension Mother   . Alcoholism Other    Family Psychiatric  History: see Admission H and P Social History:  History  Alcohol Use  . 97.2 oz/week  . 150 Cans  of beer, 12 Shots of liquor per week    Comment: he drinks every day as much as he can     History  Drug Use  . Yes  . Special: Cocaine, Other-see comments, Benzodiazepines    Social History   Social History  . Marital Status: Single    Spouse Name: N/A  . Number of Children: N/A  . Years of Education: N/A   Social History Main Topics  . Smoking status: Current Every Day Smoker -- 1.50 packs/day for 10 years    Types: Cigarettes  . Smokeless tobacco: Former Neurosurgeon    Types: Snuff  . Alcohol Use: 97.2 oz/week    150 Cans of beer, 12 Shots of liquor per week     Comment: he drinks every day as much as he can  . Drug Use: Yes    Special: Cocaine, Other-see comments, Benzodiazepines  . Sexual Activity: Not Asked   Other Topics Concern  . None   Social History Narrative   Additional Social History:                         Sleep: Fair  Appetite:  Fair  Current Medications: Current Facility-Administered Medications  Medication Dose Route Frequency Provider Last Rate Last Dose  . alum & mag hydroxide-simeth (MAALOX/MYLANTA) 200-200-20 MG/5ML suspension 30 mL  30 mL Oral Q4H PRN Nelly Rout, MD      . buPROPion (WELLBUTRIN XL) 24 hr tablet 300 mg  300 mg Oral Daily Madie Reno  Jorja LoaA Curtis Klingberg, MD   300 mg at 08/18/15 0909  . hydrOXYzine (ATARAX/VISTARIL) tablet 50 mg  50 mg Oral QHS PRN Curtis FannyJohn C Withrow, FNP   50 mg at 08/17/15 2240  . lamoTRIgine (LAMICTAL) tablet 50 mg  50 mg Oral BID Rachael FeeIrving A Zerah Hilyer, MD   50 mg at 08/18/15 0910  . magnesium hydroxide (MILK OF MAGNESIA) suspension 30 mL  30 mL Oral Daily PRN Nelly RoutArchana Kumar, MD      . multivitamin with minerals tablet 1 tablet  1 tablet Oral Daily Curtis HoughSpencer E Simon, PA-C   1 tablet at 08/18/15 0909  . prazosin (MINIPRESS) capsule 3 mg  3 mg Oral QHS Rachael FeeIrving A Jaleil Renwick, MD   3 mg at 08/17/15 2240  . thiamine (B-1) injection 100 mg  100 mg Intramuscular Once Curtis HoughSpencer E Simon, PA-C   100 mg at 08/03/15 2330  . thiamine (VITAMIN B-1) tablet 100  mg  100 mg Oral Daily Curtis HoughSpencer E Simon, PA-C   100 mg at 08/18/15 [id number]  . zolpidem (AMBIEN) tablet 10 mg  10 mg Oral QHS PRN Rachael FeeIrving A Jovahn Breit, MD   10 mg at 08/17/15 2240    Lab Results: No results found for this or any previous visit (from the past 48 hour(s)).  Physical Findings: AIMS: Facial and Oral Movements Muscles of Facial Expression: None, normal Lips and Perioral Area: None, normal Jaw: None, normal Tongue: None, normal,Extremity Movements Upper (arms, wrists, hands, fingers): None, normal Lower (legs, knees, ankles, toes): None, normal, Trunk Movements Neck, shoulders, hips: None, normal, Overall Severity Severity of abnormal movements (highest score from questions above): None, normal Incapacitation due to abnormal movements: None, normal Patient's awareness of abnormal movements (rate only patient's report): No Awareness, Dental Status Current problems with teeth and/or dentures?: No Does patient usually wear dentures?: No  CIWA:  CIWA-Ar Total: 1 COWS:  COWS Total Score: 1  Musculoskeletal: Strength & Muscle Tone: within normal limits Gait & Station: normal Patient leans: normal  Psychiatric Specialty Exam: Review of Systems  Constitutional: Negative.   HENT: Negative.   Eyes: Negative.   Respiratory: Negative.   Cardiovascular: Negative.   Gastrointestinal: Negative.   Genitourinary: Negative.   Musculoskeletal: Negative.   Skin: Negative.   Neurological: Negative.   Endo/Heme/Allergies: Negative.   Psychiatric/Behavioral: Positive for substance abuse. The patient is nervous/anxious.     Blood pressure 121/84, pulse 88, temperature 97.9 F (36.6 C), temperature source Oral, resp. rate 18, height 5\' 11"  (1.803 m), weight 77.111 kg (170 lb).Body mass index is 23.72 kg/(m^2).  General Appearance: Fairly Groomed  Patent attorneyye Contact::  Fair  Speech:  Clear and Coherent  Volume:  fluctuates  Mood:  Anxious and worried  Affect:  anxious worried  Thought Process:   Coherent and Goal Directed  Orientation:  Full (Time, Place, and Person)  Thought Content:  symptoms events worries concerns  Suicidal Thoughts:  No  Homicidal Thoughts:  No  Memory:  Immediate;   Fair Recent;   Fair Remote;   Fair  Judgement:  Fair  Insight:  Present  Psychomotor Activity:  Restlessness  Concentration:  Fair  Recall:  Fis[employer]Fair  Fund of Knowledge:Fair  Language: Fair  Akathisia:  No  Handed:  Right  AIMS (if indicated):     Assets:  Desire for Improvement  ADL's:  Intact  Cognition: WNL  Sleep:  Number of Hours: 6   Treatment Plan Summary: Daily contact with patient to assess and evaluate symptoms and progress in treatment and Medication  management Supportive approach/coping skills Depression; continue the Wellbutrin XL 300 mg in AM PTSD; continue the minipress 3 mg HS Mood instability; continue the Lamictal 50 mg BID Work with CBT/mindfulness/continue to explore placement options  Veleda Mun A 08/18/2015, 3:14 PM

## 2015-08-18 NOTE — Progress Notes (Signed)
Patient ID: Curtis Blankenship, male   DOB: 1981/04/20, 34 y.o.   MRN: [id number]  Pt approached writer today at the medication window. Pt spoke to Clinical research associatewriter about [facility] [place] because he was concerned he would have no where to stay while waiting to see a PCP. Pt affect brighter, less irritable.

## 2015-08-18 NOTE — Progress Notes (Signed)
Pt was observed in his room, in bed, reading when writer approached him for the shift assessment.  Without being asked, pt looked up and said, "I am not having any suicidal thoughts, nor do I want to harm anyone.  I am not seeing or hearing anything strange that is not there."  Writer responded that pt knew "the drill", and he smiled.  He reports that SW staff are still working on his discharge plan and that Dr. Dub MikesLugo is working to help him get into a [place] program.  Pt says that is really what he wants to do as they would understand his issues.  Pt was pleasant with Clinical research associatewriter.  He makes is needs known to staff.  He requested that his meds be given around 2230.  Support and encouragement offered.  Safety maintained with q15 minute checks.

## 2015-08-18 NOTE — Progress Notes (Signed)
Pt did not attend AA group this morning.  

## 2015-08-18 NOTE — Plan of Care (Signed)
Problem: Ineffective individual coping Goal: STG: Patient will remain free from self harm Outcome: Completed/Met Date Met:  08/18/15 Pt remains free from self harm today with 15 minute checks

## 2015-08-19 NOTE — Progress Notes (Signed)
Pt attended AA meeting and was appropriate throughout the meeting.  

## 2015-08-19 NOTE — [facility] Group Notes (Signed)
[facility]   08/19/2015 10:58 AM  Participation Quality:  Appropriate   Mood/Affect:  Appropriate  Depression Rating:  2  Anxiety Rating:  7  Thoughts of Suicide:  No Will you contract for safety?   No  Current AVH:  NA  Plan for Discharge/Comments:  Pt plans to contact [facility] and Winferd Humphreyee Gray [facility] today per CSW request. He is not wanting to pursue [facility] at this time and stated that [facility] Pt reports poor sleep. Pleasant during group.   Transportation Means: unknown at this time.   Supports: mother (limited support per pt)   Smart, Lebron QuamHeather LCSWA

## 2015-08-19 NOTE — Progress Notes (Signed)
Recreation Therapy Notes  Date: 10.21.2016 Time: 9:30am Location: 300 Hall Group Room   Group Topic: Stress Management  Goal Area(s) Addresses:  Patient will actively participate in stress management techniques presented during session.   Behavioral Response: Did not attend.     Marykay Lexenise L Skylin Kennerson, LRT/CTRS        Kamira Mellette L 08/19/2015 1:36 PM

## 2015-08-19 NOTE — Progress Notes (Signed)
Patient has been isolative to room during the shift.  Patient did not participate in groups or unit activities.  Patient had minimal interaction with staff.  When writer said good morning to patient, patient stated "I am not suicidal, homicidal, I am not hearing voices I am not seeing things and I have no pain.  Patient had no further interaction with writer, but was compliant with medications.  Patient had no unsafe behaviors or periods of behavioral dyscontrol.   Assess and monitor patient for safety, offered medications as prescribed and monitor for effectiveness/side effects of medications.   Patient was compliant with medications.  Patient was able to verbally contract for safety.

## 2015-08-19 NOTE — Progress Notes (Signed)
D: Pt attended AA meeting this evening. Pt otherwise remained isolative to his room. Upon assessment, pt stated "I am not suicidal, homicidal, I am not hearing voices, I am not seeing things, and I have no pain." Pt maintains minimal eye contact with staff. Minimal interaction with staff. Pt is compliant with medications. No concerns or complaints at this time. A: Pt offered medications as prescribed. Encouraged to verbalize any concerns to staff. q15 minute safety checks maintained. R: Safety is maintained.

## 2015-08-19 NOTE — Progress Notes (Signed)
Patient ID: Curtis Blankenship, male   DOB: 1981-07-24, 34 y.o.   MRN: [id number] PER STATE REGULATIONS 482.30  THIS CHART WAS REVIEWED FOR MEDICAL NECESSITY WITH RESPECT TO THE PATIENT'S ADMISSION/ DURATION OF STAY.  NEXT REVIEW DATE:   08/23/2015  Willa RoughJENNIFER JONES Curtis Antu, RN, BSN CASE MANAGER

## 2015-08-19 NOTE — Tx Team (Signed)
Interdisciplinary Treatment Plan Update (Adult)  Date:  08/19/2015  Time Reviewed:  10:59 AM   Progress in Treatment: Attending groups: Yes  Participating in groups:  Yes  Taking medication as prescribed:  Yes. Tolerating medication:  Yes. Family/Significant othe contact made:  SPE completed with pt, as he refused contact with family. Pt later agreeable to contact with his mother. SPE and collateral info obtained from pt's mother.  Patient understands diagnosis:  Yes. and As evidenced by:  seeking treatment for SI with overdose of cocaine, alcohol abuse, depression, and medication stabilization. Discussing patient identified problems/goals with staff:  Yes. Medical problems stabilized or resolved:  Yes. Denies suicidal/homicidal ideation: Yes. Self report.  Issues/concerns per patient self-inventory:  Other:  Discharge Plan or Barriers: Pt denied from SARRTP at [facility] and was instructed by them to follow-up outpatient for PCP and mental health before he would be eligible for program. Pt wants to follow-up at the [facility]. CSW made contact with them-they want him to call and schedule appt directly. Pt not wanting to pursue [facility] at this point. Pt given [employer] and [employer].    Reason for Continuation of Hospitalization: Anxiety  Medication stabilization  Comments:  Curtis Blankenship is a 34 y.o. male with history of alcohol and cocaine abuse presented to the ER because of suicidal ideation. Patient states he has been drinking alcohol everyday and last evening had taken extra dose of cocaine with intention of suicide. Patient states that he passed out following the last dose of cocaine. When he woke up he started developing chest pressure which lasted for 1 hour and resolved. Patient has been having some left-sided blurred vision since then. Patient hired a cab and came to the ER. Prior to which patient also had one episode of nausea vomiting. In the ER  patient's labs revealed positive cocaine with mild hyponatremia and chest x-ray showing possible infiltrate. Patient was placed on suicide precaution and admitted for further management.  Denies any shortness of breath and chest pain at this time is resolved. Still complains of some left-sided blurred vision.  Estimated length of stay:  2-4 days   New goal(s): to develop effective after care plan.   Additional Comments:  Patient and CSW reviewed pt's identified goals and treatment plan. Patient verbalized understanding and agreed to treatment plan. CSW reviewed [facility] "Discharge Process and Patient Involvement" Form. Pt verbalized understanding of information provided and signed form.    Review of initial/current patient goals per problem list:  1. Goal(s): Patient will participate in aftercare plan  Met: No.   Target date: at discharge  As evidenced by: Patient will participate within aftercare plan AEB aftercare provider and housing plan at discharge being identified.  10/6: CSW assessing for appropriate referrals.   10/11: Pt requesting referral to [place] program CSW assessing.   10/14: [facility] to [facility]. Review is on Tuesday for [place] program.   10/21: Denied for inpatient programs through [place] [facility] [place]. Denied psych bed that became open. Pending referral through [facility]-pt not wanting to accept at this point. CSW continuing to assess.   2. Goal (s): Patient will exhibit decreased depressive symptoms and suicidal ideations.  Met: Yes    Target date: at discharge  As evidenced by: Patient will utilize self rating of depression at 3 or below and demonstrate decreased signs of depression or be deemed stable for discharge by MD.  10/6: Pt rates depression as high  to day. Denies SI/HI/AVH.   10/11: Pt continues to rate depression as high today with no SI.   10/14: Pt rates depression as high today and presents with  irritable mood.   10/21: Pt rates depression as 2/10 but now rates anxiety as 7/10 with some mood instability. Pt appears to be nearing his baseline.    3. Goal(s): Patient will demonstrate decreased signs of withdrawal due to substance abuse  Met:Yes   Target date:at discharge   As evidenced by: Patient will produce a CIWA/COWS score of 0, have stable vitals signs, and no symptoms of withdrawal.    10/6: Pt reports moderate withdrawals with CIWA score of 11 and high sitting BP.    10/11: Pt reports no signs of withdrawal with CIWA/COWS of 0 and stable vitals.   Attendees: Patient:   08/19/2015 10:59 AM   Family:   08/19/2015 10:59 AM   Physician:  Dr. Carlton Adam, MD 08/19/2015 10:59 AM   Nursing: Sonny Dandy RN 08/19/2015 10:59 AM   Clinical Social Worker: Maxie Better, [place]  08/19/2015 10:59 AM   Clinical Social Worker: Erasmo Downer Drinkard LCSWA  08/19/2015 10:59 AM   Other:  Gerline Legacy Nurse Case Manager 08/19/2015 10:59 AM   Other:  Lucinda Dell; [facility]  08/19/2015 10:59 AM   Other:   08/19/2015 10:59 AM   Other:  08/19/2015 10:59 AM   Other:  08/19/2015 10:59 AM   Other:  08/19/2015 10:59 AM    08/19/2015 10:59 AM    08/19/2015 10:59 AM    08/19/2015 10:59 AM    08/19/2015 10:59 AM    Scribe for Treatment Team:   Maxie Better, LCSWA  08/19/2015 10:59 AM

## 2015-08-19 NOTE — Progress Notes (Signed)
[facility]  08/19/2015 3:46 PM Curtis Blankenship  MRN:  [id number] Subjective:  Curtis PilgrimJacob continues to work on getting his life back together. States that being here has given him the time to think and states he really wants to do something to change the way his life is going. These most recent episode of having the TIA opened his eyes as to what can happen if he does not quit for good.  He states he still feels he needs to be under "lock and key" states he does not trust himself with being able to stay sober. States he needs and wants  to pursue a residential treatment program.  Principal Problem: Severe recurrent major depression without psychotic features (HCC) Diagnosis:   Patient Active Problem List   Diagnosis Date Noted  . Uncomplicated alcohol dependence (HCC) [F10.20]   . Severe recurrent major depression without psychotic features (HCC) [F33.2] 08/04/2015  . Hyponatremia [E87.1] 08/02/2015  . Alcohol abuse [F10.10] 08/02/2015  . Suicidal ideation [R45.851] 08/02/2015  . Aspiration pneumonia (HCC) [J69.0] 08/02/2015  . Syncope [R55] 08/02/2015  . Blurred vision [H53.8]   . TIA (transient ischemic attack) [G45.9] 09/19/2013  . Disturbance of skin sensation [R20.9] 09/19/2013  . 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 Alcohol dependence (HCC) [F10.20] 12/01/2012  . Cocaine abuse [F14.10] 12/01/2012  . PTSD (post-traumatic stress disorder) [F43.10] 12/01/2012  . Depressive disorder [F32.9] 12/01/2012   Total Time spent with patient: 30 minutes  Past Psychiatric History: see admission H and P  Past Medical History:  Past Medical History  Diagnosis Date  . Bipolar disorder (HCC)   . PTSD (post-traumatic stress disorder)   . Depression     Past Surgical History  Procedure Laterality Date  . Appendectomy     Family History:  Family History  Problem Relation Age of Onset  . Hypertension Mother   . Alcoholism Other    Family Psychiatric  History: see Admission H and  P Social History:  History  Alcohol Use  . 97.2 oz/week  . 150 Cans of beer, 12 Shots of liquor per week    Comment: he drinks every day as much as he can     History  Drug Use  . Yes  . Special: Cocaine, Other-see comments, Benzodiazepines    Social History   Social History  . Marital Status: Single    Spouse Name: N/A  . Number of Children: N/A  . Years of Education: N/A   Social History Main Topics  . Smoking status: Current Every Day Smoker -- 1.50 packs/day for 10 years    Types: Cigarettes  . Smokeless tobacco: Former NeurosurgeonUser    Types: Snuff  . Alcohol Use: 97.2 oz/week    150 Cans of beer, 12 Shots of liquor per week     Comment: he drinks every day as much as he can  . Drug Use: Yes    Special: Cocaine, Other-see comments, Benzodiazepines  . Sexual Activity: Not Asked   Other Topics Concern  . None   Social History Narrative   Additional Social History:                         Sleep: Fair  Appetite:  Fair  Current Medications: Current Facility-Administered Medications  Medication Dose Route Frequency Provider Last Rate Last Dose  . alum & mag hydroxide-simeth (MAALOX/MYLANTA) 200-200-20 MG/5ML suspension 30 mL  30 mL Oral Q4H PRN Nelly RoutArchana Kumar, MD      .  buPROPion (WELLBUTRIN XL) 24 hr tablet 300 mg  300 mg Oral Daily Rachael Fee, MD   300 mg at 08/19/15 4098  . hydrOXYzine (ATARAX/VISTARIL) tablet 50 mg  50 mg Oral QHS PRN Beau Fanny, FNP   50 mg at 08/18/15 2224  . lamoTRIgine (LAMICTAL) tablet 50 mg  50 mg Oral BID Rachael Fee, MD      . magnesium hydroxide (MILK OF MAGNESIA) suspension 30 mL  30 mL Oral Daily PRN Nelly Rout, MD      . multivitamin with minerals tablet 1 tablet  1 tablet Oral Daily Kerry Hough, PA-C   1 tablet at 08/19/15 0825  . prazosin (MINIPRESS) capsule 3 mg  3 mg Oral QHS Rachael Fee, MD   3 mg at 08/18/15 2223  . thiamine (B-1) injection 100 mg  100 mg Intramuscular Once Kerry Hough, PA-C   100 mg at  08/03/15 2330  . thiamine (VITAMIN B-1) tablet 100 mg  100 mg Oral Daily Kerry Hough, PA-C   100 mg at 08/19/15 1191  . zolpidem (AMBIEN) tablet 10 mg  10 mg Oral QHS PRN Rachael Fee, MD   10 mg at 08/18/15 2223    Lab Results: No results found for this or any previous visit (from the past 48 hour(s)).  Physical Findings: AIMS: Facial and Oral Movements Muscles of Facial Expression: None, normal Lips and Perioral Area: None, normal Jaw: None, normal Tongue: None, normal,Extremity Movements Upper (arms, wrists, hands, fingers): None, normal Lower (legs, knees, ankles, toes): None, normal, Trunk Movements Neck, shoulders, hips: None, normal, Overall Severity Severity of abnormal movements (highest score from questions above): None, normal Incapacitation due to abnormal movements: None, normal Patient's awareness of abnormal movements (rate only patient's report): No Awareness, Dental Status Current problems with teeth and/or dentures?: No Does patient usually wear dentures?: No  CIWA:  CIWA-Ar Total: 1 COWS:  COWS Total Score: 1  Musculoskeletal: Strength & Muscle Tone: within normal limits Gait & Station: normal Patient leans: normal  Psychiatric Specialty Exam: Review of Systems  Constitutional: Negative.   HENT: Negative.   Eyes: Negative.   Respiratory: Negative.   Cardiovascular: Negative.   Gastrointestinal: Negative.   Genitourinary: Negative.   Musculoskeletal: Negative.   Skin: Negative.   Neurological: Negative.   Endo/Heme/Allergies: Negative.   Psychiatric/Behavioral: Positive for substance abuse. The patient is nervous/anxious.     Blood pressure 114/72, pulse 80, temperature 97.5 F (36.4 C), temperature source Oral, resp. rate 16, height  (1.803 m), weight 77.111 kg (170 lb).Body mass index is 23.72 kg/(m^2).  General Appearance: Fairly Groomed  Patent attorney::  Fair  Speech:  Clear and Coherent  Volume:  Normal  Mood:  Anxious and worried   Affect:  anxious worried  Thought Process:  Coherent and Goal Directed  Orientation:  Full (Time, Place, and Person)  Thought Content:  symptoms events worries concerns  Suicidal Thoughts:  No  Homicidal Thoughts:  No  Memory:  Immediate;   Fair Recent;   Fair Remote;   Fair  Judgement:  Fair  Insight:  Present  Psychomotor Activity:  Restlessness  Concentration:  Fair  Recall:  [employer] of Knowledge:Fair  Language: Fair  Akathisia:  No  Handed:  Right  AIMS (if indicated):     Assets:  Desire for Improvement  ADL's:  Intact  Cognition: WNL  Sleep:  Number of Hours: 6   Treatment Plan Summary: Daily contact with patient  to assess and evaluate symptoms and progress in treatment and Medication management Supportive approach/coping skills Depression; continue the Wellbutrin XL 300 mg in AM Substance abuse; will continue to work a relapse prevention plan Mood instability; will continue the Lamictal 50 mg BID PTSD; continue the prazosin 3 mg HS Explore residential treatment programs Bitha Fauteux A 08/19/2015, 3:46 PM

## 2015-08-19 NOTE — [facility] Group Notes (Signed)
[facility] LCSW Group Therapy  08/19/2015 1:10 PM  Type of Therapy:  Group Therapy  Participation Level:  Active  Participation Quality:  Attentive  Affect:  Appropriate  Cognitive:  Alert and Oriented  Insight:  Improving  Engagement in Therapy:  Improving  Modes of Intervention:  Discussion, Education, Exploration, Problem-solving, Rapport Building, Socialization and Support  Summary of Progress/Problems: Feelings around Relapse. Group members discussed the meaning of relapse and shared personal stories of relapse, how it affected them and others, and how they perceived themselves during this time. Group members were encouraged to identify triggers, warning signs and coping skills used when facing the possibility of relapse. Social supports were discussed and explored in detail. Post Acute Withdrawal Syndrome (handout provided) was introduced and examined. Pt's were encouraged to ask questions, talk about key points associated with PAWS, and process this information in terms of relapse prevention. Curtis Blankenship "Curtis Blankenship" was attentive and engaged during today's processing group. 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 "The old me died in [place]. I hate being out of control but I have no impulse control." Curtis Blankenship shared that his mother made the decision for him to get an assessment with [facility] because "I don't trust my judgement at this point. I've done things my way for too long now and look where it's gotten me."   Smart, Tara Rud LCSWA 08/19/2015, 1:10 PM

## 2015-08-19 NOTE — Progress Notes (Signed)
Pt reports he is ok today, and that the day has been "just like the rest".  [facility]  He is unsure what his discharge plans are at this time.  He mainly stays in his room and does not attend groups.  He is vague and sarcastic with staff.  He has minimal interaction with peers.  He mostly reads books that have been brought to him by family and friends.  He denies SI/HI/AVH, and says his depression and anxiety are 5/10.  He makes few requests.  Support and encouragement offered.  Safety maintained with q15 minute checks.

## 2015-08-20 MED ORDER — PRAZOSIN HCL 2 MG PO CAPS
4.0000 mg | ORAL_CAPSULE | Freq: Every day | ORAL | Status: DC
Start: 2015-08-20 — End: 2015-08-21
  Administered 2015-08-20: 4 mg via ORAL
  Filled 2015-08-20 (×2): qty 2
  Filled 2015-08-20: qty 1
  Filled 2015-08-20: qty 2

## 2015-08-20 NOTE — [facility] Group Notes (Signed)
[facility] Group Notes:  (Nursing/MHT/Case Management/Adjunct)  Date:  08/20/2015  Time:  3:04 PM  Type of Therapy:  Psychoeducational Skills  Participation Level:  Active  Participation Quality:  Appropriate  Affect:  Appropriate  Cognitive:  Appropriate  Insight:  Appropriate  Engagement in Group:  Engaged  Modes of Intervention:  Discussion  Summary of Progress/Problems: Pt did attend healthy coping skills group.     Jacquelyne BalintForrest, Danali Marinos Shanta 08/20/2015, 3:04 PM

## 2015-08-20 NOTE — Progress Notes (Signed)
D) Pt did attend the groups today. Affect and mood are a little less intense. Participated in the groups as well as attended. Pt rates his depression at a 5, hopelessness at a 5 and his anxiety at a 3. A) given support, reassurance and praise along with encouragment. R) Denies SI and HI.

## 2015-08-20 NOTE — [facility] Group Notes (Signed)
[facility] Group Notes:  (Clinical Social Work)  08/20/2015     10-11AM  Summary of Progress/Problems:   The main focus of today's process group was to learn how to use a decisional balance exercise to move forward in the Stages of Change.  Patients listed unhealthy coping techniques, then  Motivational Interviewing and the whiteboard were utilized to help patients explore in depth the perceived benefits and costs of unhealthy coping techniques, as well as the  benefits and costs of replacing that with a healthy coping skills.   The patient expressed that his own unhealthy coping involves drinking and drugging, and he stated he continues to be working on "accepting that I have a problem."    Type of Therapy:  Group Therapy - Process   Participation Level:  Active  Participation Quality:  Attentive and Sharing  Affect:  Appropriate  Cognitive:  Appropriate  Insight:  Developing/Improving  Engagement in Therapy:  Engaged  Modes of Intervention:  Education, Motivational Interviewing  Curtis MantleMareida Grossman-Orr, LCSW 08/20/2015, 12:15 PM

## 2015-08-20 NOTE — Progress Notes (Signed)
Patient did attend the evening speaker AA meeting.  

## 2015-08-20 NOTE — Progress Notes (Signed)
D:Patient in hallway on first approach.  Patient states to Clinical research associatewriter he was not suicidal, he did not want to hurt anyone, and he was not having auditory or visual hallucinations before Clinical research associatewriter even asked.  Patient sat and appeared to reading a book while Clinical research associatewriter spoke with him.  Patient states he did not set a goal for today.  Patient denies SI/HI and denies AVH. A: Staff to monitor Q 15 mins for safety.  Encouragement and support offered.  Scheduled medications administered per orders.  Vistaril administered prn for racing thoughts.  Ambien administered prn for sleep.   R: Patient remains safe on the unit.  Patient attended group tonight. Patient visible on the unit and interacting with peers.

## 2015-08-20 NOTE — Plan of Care (Signed)
Problem: Alteration in mood & ability to function due to Goal: STG-Patient will comply with prescribed medication regimen (Patient will comply with prescribed medication regimen)  Outcome: Progressing Pt continues to comply with prescribed medications.

## 2015-08-20 NOTE — [facility] Group Notes (Signed)
[facility] Group Notes:  (Nursing/MHT/Case Management/Adjunct)  Date:  08/20/2015  Time:  11:01 AM  Type of Therapy:  Psychoeducational Skills  Participation Level:  Did Not Attend  Participation Quality:  Did Not Attend  Affect:  Did Not Attend  Cognitive:  Did Not Attend  Insight:  None  Engagement in Group:  Did Not Attend  Modes of Intervention:  Did Not Attend  Summary of Progress/Problems: Pt did not attend patient self inventory group.   Curtis Blankenship, Curtis Blankenship 08/20/2015, 11:01 AM

## 2015-08-20 NOTE — Progress Notes (Signed)
[facility]  08/20/2015 4:26 PM Curtis Blankenship  MRN:  [id number] Subjective:  "I'm a little better, but my minipress isn't strong enough. The nightmares are improving, but they aren't quite gone yet."  Objective: Pt seen and chart reviewed. Pt is alert/oriented x4, calm, cooperative, and appropriate to situation. Pt denies suicidal/homicidal ideation and psychosis and does not appear to be respoding to internal stimuli. Pt cites poor sleep with nightmares although improving. Cites good appetite. Reports participation in groups and that they are helping some. He reports he wants to be in a long-term facility.   Principal Problem: Severe recurrent major depression without psychotic features (HCC) Diagnosis:   Patient Active Problem List   Diagnosis Date Noted  . Severe recurrent major depression without psychotic features (HCC) [F33.2] 08/04/2015    Priority: High  . Uncomplicated alcohol dependence (HCC) [F10.20]     Priority: Medium  . Alcohol dependence (HCC) [F10.20] 12/01/2012    Priority: Medium  . Cocaine abuse [F14.10] 12/01/2012    Priority: Medium  . PTSD (post-traumatic stress disorder) [F43.10] 12/01/2012    Priority: Medium  . Hyponatremia [E87.1] 08/02/2015  . Alcohol abuse [F10.10] 08/02/2015  . Suicidal ideation [R45.851] 08/02/2015  . Aspiration pneumonia (HCC) [J69.0] 08/02/2015  . Syncope [R55] 08/02/2015  . Blurred vision [H53.8]   . TIA (transient ischemic attack) [G45.9] 09/19/2013  . Disturbance of skin sensation [R20.9] 09/19/2013  . 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 Depressive disorder [F32.9] 12/01/2012   Total Time spent with patient: 30 minutes  Past Psychiatric History: see admission H and P  Past Medical History:  Past Medical History  Diagnosis Date  . Bipolar disorder (HCC)   . PTSD (post-traumatic stress disorder)   . Depression     Past Surgical History  Procedure Laterality Date  . Appendectomy     Family History:  Family  History  Problem Relation Age of Onset  . Hypertension Mother   . Alcoholism Other    Family Psychiatric  History: see Admission H and P Social History:  History  Alcohol Use  . 97.2 oz/week  . 150 Cans of beer, 12 Shots of liquor per week    Comment: he drinks every day as much as he can     History  Drug Use  . Yes  . Special: Cocaine, Other-see comments, Benzodiazepines    Social History   Social History  . Marital Status: Single    Spouse Name: N/A  . Number of Children: N/A  . Years of Education: N/A   Social History Main Topics  . Smoking status: Current Every Day Smoker -- 1.50 packs/day for 10 years    Types: Cigarettes  . Smokeless tobacco: Former NeurosurgeonUser    Types: Snuff  . Alcohol Use: 97.2 oz/week    150 Cans of beer, 12 Shots of liquor per week     Comment: he drinks every day as much as he can  . Drug Use: Yes    Special: Cocaine, Other-see comments, Benzodiazepines  . Sexual Activity: Not Asked   Other Topics Concern  . None   Social History Narrative   Additional Social History:                         Sleep: Fair  Appetite:  Fair  Current Medications: Current Facility-Administered Medications  Medication Dose Route Frequency Provider Last Rate Last Dose  . alum & mag hydroxide-simeth (MAALOX/MYLANTA) 200-200-20 MG/5ML suspension 30 mL  30 mL Oral Q4H PRN Nelly Rout, MD      . buPROPion (WELLBUTRIN XL) 24 hr tablet 300 mg  300 mg Oral Daily Rachael Fee, MD   300 mg at 08/20/15 0834  . hydrOXYzine (ATARAX/VISTARIL) tablet 50 mg  50 mg Oral QHS PRN Beau Fanny, FNP   50 mg at 08/19/15 2209  . lamoTRIgine (LAMICTAL) tablet 50 mg  50 mg Oral BID Rachael Fee, MD   50 mg at 08/20/15 0834  . magnesium hydroxide (MILK OF MAGNESIA) suspension 30 mL  30 mL Oral Daily PRN Nelly Rout, MD      . multivitamin with minerals tablet 1 tablet  1 tablet Oral Daily Kerry Hough, PA-C   1 tablet at 08/20/15 [phone]  . prazosin (MINIPRESS)  capsule 4 mg  4 mg Oral QHS Everardo All Withrow, FNP      . thiamine (B-1) injection 100 mg  100 mg Intramuscular Once Kerry Hough, PA-C   100 mg at 08/03/15 2330  . thiamine (VITAMIN B-1) tablet 100 mg  100 mg Oral Daily Kerry Hough, PA-C   100 mg at 08/20/15 9604  . zolpidem (AMBIEN) tablet 10 mg  10 mg Oral QHS PRN Rachael Fee, MD   10 mg at 08/19/15 2209    Lab Results: No results found for this or any previous visit (from the past 48 hour(s)).  Physical Findings: AIMS: Facial and Oral Movements Muscles of Facial Expression: None, normal Lips and Perioral Area: None, normal Jaw: None, normal Tongue: None, normal,Extremity Movements Upper (arms, wrists, hands, fingers): None, normal Lower (legs, knees, ankles, toes): None, normal, Trunk Movements Neck, shoulders, hips: None, normal, Overall Severity Severity of abnormal movements (highest score from questions above): None, normal Incapacitation due to abnormal movements: None, normal Patient's awareness of abnormal movements (rate only patient's report): No Awareness, Dental Status Current problems with teeth and/or dentures?: No Does patient usually wear dentures?: No  CIWA:  CIWA-Ar Total: 1 COWS:  COWS Total Score: 1  Musculoskeletal: Strength & Muscle Tone: within normal limits Gait & Station: normal Patient leans: normal  Psychiatric Specialty Exam: Review of Systems  Constitutional: Negative.   HENT: Negative.   Eyes: Negative.   Respiratory: Negative.   Cardiovascular: Negative.   Gastrointestinal: Negative.   Genitourinary: Negative.   Musculoskeletal: Negative.   Skin: Negative.   Neurological: Negative.   Endo/Heme/Allergies: Negative.   Psychiatric/Behavioral: Positive for depression and substance abuse. Negative for suicidal ideas. The patient is nervous/anxious and has insomnia.   All other systems reviewed and are negative.   Blood pressure 116/72, pulse 106, temperature 98 F (36.7 C), temperature  source Oral, resp. rate 16, height  (1.803 m), weight 77.111 kg (170 lb).Body mass index is 23.72 kg/(m^2).  General Appearance: Fairly Groomed  Patent attorney::  Fair  Speech:  Clear and Coherent  Volume:  Normal  Mood:  Anxious and worried  Affect:  anxious worried  Thought Process:  Coherent and Goal Directed  Orientation:  Full (Time, Place, and Person)  Thought Content:  symptoms events worries concerns  Suicidal Thoughts:  No  Homicidal Thoughts:  No  Memory:  Immediate;   Fair Recent;   Fair Remote;   Fair  Judgement:  Fair  Insight:  Present  Psychomotor Activity:  Restlessness  Concentration:  Fair  Recall:  [employer] of Knowledge:Fair  Language: Fair  Akathisia:  No  Handed:  Right  AIMS (if indicated):  Assets:  Desire for Improvement  ADL's:  Intact  Cognition: WNL  Sleep:  Number of Hours: 6    Treatment Plan Summary: Daily contact with patient to assess and evaluate symptoms and progress in treatment and Medication management Supportive approach/coping skills Depression; continue the Wellbutrin XL 300 mg in AM Substance abuse; will continue to work a relapse prevention plan Mood instability; will continue the Lamictal 50 mg BID PTSD; Increase the prazosin 4 mg HS Explore residential treatment programs  Beau Fanny, FNP-Bc 08/20/2015, 4:26 PM  I reviewed chart and agreed with the findings and treatment Plan.  Kathryne Sharper, MD

## 2015-08-21 MED ORDER — PROPRANOLOL HCL ER 60 MG PO CP24
60.0000 mg | ORAL_CAPSULE | Freq: Every day | ORAL | Status: DC
  Administered 2015-08-22 – 2015-08-29 (×7): 60 mg via ORAL
  Filled 2015-08-21 (×11): qty 1

## 2015-08-21 MED ORDER — HYDROXYZINE HCL 50 MG PO TABS
50.0000 mg | ORAL_TABLET | Freq: Four times a day (QID) | ORAL | Status: DC | PRN
  Administered 2015-08-21 – 2015-08-28 (×8): 50 mg via ORAL
  Filled 2015-08-21 (×8): qty 1

## 2015-08-21 MED ORDER — PRAZOSIN HCL 2 MG PO CAPS
3.0000 mg | ORAL_CAPSULE | Freq: Every day | ORAL | Status: DC
  Administered 2015-08-21 – 2015-08-28 (×8): 3 mg via ORAL
  Filled 2015-08-21 (×11): qty 1

## 2015-08-21 MED ORDER — PRAZOSIN HCL 1 MG PO CAPS
ORAL_CAPSULE | ORAL | Status: AC
  Filled 2015-08-21: qty 1

## 2015-08-21 NOTE — [facility] Group Notes (Signed)
[facility] Group Notes: (Clinical Social Work)   08/21/2015      Type of Therapy:  Group Therapy   Participation Level:  Did Not Attend - came to the beginning of group and was very irritable, left cursing   Curtis MantleMareida Grossman-Orr, LCSW 08/21/2015, 12:22 PM

## 2015-08-21 NOTE — Plan of Care (Signed)
Problem: Diagnosis: Increased Risk For Suicide Attempt Goal: LTG-Patient Will Report Improved Mood and Deny Suicidal LTG (by discharge) Patient will report improved mood and deny suicidal ideation.  Outcome: Progressing Patient denies SI.  Patient verbally contracts for safety.     

## 2015-08-21 NOTE — Plan of Care (Signed)
Problem: Diagnosis: Increased Risk For Suicide Attempt Goal: STG-Patient Will Attend All Groups On The Unit Outcome: Not Progressing Still with minimal interaction

## 2015-08-21 NOTE — Progress Notes (Signed)
Patient did not attend the evening speaker AA meeting. Pt was notified that group was beginning but returned to his room.   

## 2015-08-21 NOTE — Progress Notes (Signed)
D: Curtis Blankenship who goes by Curtis Blankenship is still depressed. Minimal interaction with staff and peers. He is in his room sitting on his bed reading a book. Rates Depression 5/10 and anxiety 0/10. Denies SI/H/AVH. Contracts for safety.  A: We discussed his new medication adjustments. Encouragement and support given.  R: Continue to monitor for patient safety and medication effectiveness.

## 2015-08-21 NOTE — Progress Notes (Signed)
[facility] MD Progress Note  08/21/2015  Curtis Blankenship  MRN:  [id number] Subjective:  "I feel a little dizzy from the Minipress, but that's normal when they go up on it. I don't feel bad. I feel slightly better. I'm thinking about going back to school."   Objective: Pt seen and chart reviewed. Pt is alert/oriented x4, cooperative, and appropriate to situation. Pt reports feeling more anxious today. He denies suicidal/homicidal ideation and psychosis and does not appear to be respoding to internal stimuli. Pt cites poor sleep with nightmares although improving. Cites good appetite. Pt reports being extremely anxious today and would like something non-benzo. He startles easily and reports PTSD-like symptoms.   Principal Problem: Severe recurrent major depression without psychotic features (HCC) Diagnosis:   Patient Active Problem List   Diagnosis Date Noted  . Severe recurrent major depression without psychotic features (HCC) [F33.2] 08/04/2015    Priority: High  . Uncomplicated alcohol dependence (HCC) [F10.20]     Priority: Medium  . Alcohol dependence (HCC) [F10.20] 12/01/2012    Priority: Medium  . Cocaine abuse [F14.10] 12/01/2012    Priority: Medium  . PTSD (post-traumatic stress disorder) [F43.10] 12/01/2012    Priority: Medium  . Hyponatremia [E87.1] 08/02/2015  . Alcohol abuse [F10.10] 08/02/2015  . Suicidal ideation [R45.851] 08/02/2015  . Aspiration pneumonia (HCC) [J69.0] 08/02/2015  . Syncope [R55] 08/02/2015  . Blurred vision [H53.8]   . TIA (transient ischemic attack) [G45.9] 09/19/2013  . Disturbance of skin sensation [R20.9] 09/19/2013  . 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 Depressive disorder [F32.9] 12/01/2012   Total Time spent with patient: 30 minutes  Past Psychiatric History: see admission H and P  Past Medical History:  Past Medical History  Diagnosis Date  . Bipolar disorder (HCC)   . PTSD (post-traumatic stress disorder)   . Depression     Past Surgical  History  Procedure Laterality Date  . Appendectomy     Family History:  Family History  Problem Relation Age of Onset  . Hypertension Mother   . Alcoholism Other    Family Psychiatric  History: see Admission H and P Social History:  History  Alcohol Use  . 97.2 oz/week  . 150 Cans of beer, 12 Shots of liquor per week    Comment: he drinks every day as much as he can     History  Drug Use  . Yes  . Special: Cocaine, Other-see comments, Benzodiazepines    Social History   Social History  . Marital Status: Single    Spouse Name: N/A  . Number of Children: N/A  . Years of Education: N/A   Social History Main Topics  . Smoking status: Current Every Day Smoker -- 1.50 packs/day for 10 years    Types: Cigarettes  . Smokeless tobacco: Former Neurosurgeon    Types: Snuff  . Alcohol Use: 97.2 oz/week    150 Cans of beer, 12 Shots of liquor per week     Comment: he drinks every day as much as he can  . Drug Use: Yes    Special: Cocaine, Other-see comments, Benzodiazepines  . Sexual Activity: Not Asked   Other Topics Concern  . None   Social History Narrative   Additional Social History:                         Sleep: Fair  Appetite:  Fair  Current Medications: Current Facility-Administered Medications  Medication Dose Route Frequency Provider Last Rate  Last Dose  . alum & mag hydroxide-simeth (MAALOX/MYLANTA) 200-200-20 MG/5ML suspension 30 mL  30 mL Oral Q4H PRN Nelly Rout, MD      . buPROPion (WELLBUTRIN XL) 24 hr tablet 300 mg  300 mg Oral Daily Rachael Fee, MD   300 mg at 08/21/15 0820  . hydrOXYzine (ATARAX/VISTARIL) tablet 50 mg  50 mg Oral QHS PRN Beau Fanny, FNP   50 mg at 08/20/15 2228  . lamoTRIgine (LAMICTAL) tablet 50 mg  50 mg Oral BID Rachael Fee, MD   50 mg at 08/21/15 0820  . magnesium hydroxide (MILK OF MAGNESIA) suspension 30 mL  30 mL Oral Daily PRN Nelly Rout, MD      . multivitamin with minerals tablet 1 tablet  1 tablet Oral  Daily Kerry Hough, PA-C   1 tablet at 08/21/15 0820  . prazosin (MINIPRESS) capsule 4 mg  4 mg Oral QHS Beau Fanny, FNP   4 mg at 08/20/15 2228  . thiamine (B-1) injection 100 mg  100 mg Intramuscular Once Kerry Hough, PA-C   100 mg at 08/03/15 2330  . thiamine (VITAMIN B-1) tablet 100 mg  100 mg Oral Daily Kerry Hough, PA-C   100 mg at 08/21/15 0820  . zolpidem (AMBIEN) tablet 10 mg  10 mg Oral QHS PRN Rachael Fee, MD   10 mg at 08/20/15 2228    Lab Results: No results found for this or any previous visit (from the past 48 hour(s)).  Physical Findings: AIMS: Facial and Oral Movements Muscles of Facial Expression: None, normal Lips and Perioral Area: None, normal Jaw: None, normal Tongue: None, normal,Extremity Movements Upper (arms, wrists, hands, fingers): None, normal Lower (legs, knees, ankles, toes): None, normal, Trunk Movements Neck, shoulders, hips: None, normal, Overall Severity Severity of abnormal movements (highest score from questions above): None, normal Incapacitation due to abnormal movements: None, normal Patient's awareness of abnormal movements (rate only patient's report): No Awareness, Dental Status Current problems with teeth and/or dentures?: No Does patient usually wear dentures?: No  CIWA:  CIWA-Ar Total: 1 COWS:  COWS Total Score: 1  Musculoskeletal: Strength & Muscle Tone: within normal limits Gait & Station: normal Patient leans: normal  Psychiatric Specialty Exam: Review of Systems  Constitutional: Negative.   HENT: Negative.   Eyes: Negative.   Respiratory: Negative.   Cardiovascular: Negative.   Gastrointestinal: Negative.   Genitourinary: Negative.   Musculoskeletal: Negative.   Skin: Negative.   Neurological: Negative.   Endo/Heme/Allergies: Negative.   Psychiatric/Behavioral: Positive for depression and substance abuse. Negative for suicidal ideas. The patient is nervous/anxious and has insomnia.   All other systems  reviewed and are negative.   Blood pressure 88/51, pulse 113, temperature 97.8 F (36.6 C), temperature source Oral, resp. rate 16, height  (1.803 m), weight 77.111 kg (170 lb).Body mass index is 23.72 kg/(m^2).  General Appearance: Fairly Groomed  Patent attorney::  Fair  Speech:  Clear and Coherent  Volume:  Normal  Mood:  Anxious and worried  Affect:  anxious worried  Thought Process:  Coherent and Goal Directed  Orientation:  Full (Time, Place, and Person)  Thought Content:  symptoms events worries concerns  Suicidal Thoughts:  No  Homicidal Thoughts:  No  Memory:  Immediate;   Fair Recent;   Fair Remote;   Fair  Judgement:  Fair  Insight:  Present  Psychomotor Activity:  Restlessness  Concentration:  Fair  Recall:  [employer] of  Knowledge:Fair  Language: Fair  Akathisia:  No  Handed:  Right  AIMS (if indicated):     Assets:  Desire for Improvement  ADL's:  Intact  Cognition: WNL  Sleep:  Number of Hours: 5.75    Treatment Plan Summary: Daily contact with patient to assess and evaluate symptoms and progress in treatment and Medication management Supportive approach/coping skills Depression; continue the Wellbutrin XL 300 mg in AM Substance abuse; will continue to work a relapse prevention plan Mood instability; will continue the Lamictal 50 mg BID PTSD; Lower the prazosin back to 3mg  HS (acute hypotension in AM and dizziness) -Inderal 60mg  XR24hr time release for PTSD (daily in AM), will consider BID if well-tolerated Explore residential treatment programs  Beau FannyWithrow, John C, FNP-BC 08/21/2015, 10:33AM  I reviewed chart and agreed with the findings and treatment Plan.  Kathryne SharperSyed Detavious Rinn, MD

## 2015-08-21 NOTE — Progress Notes (Signed)
D) Pt has been in his room much of the day. Will come out and socialize in the dayroom a little but for the most part stays to himself. Denies SI and HI. Rates his depression at a 2, hopelessness at a 3 and his anxiety at a 2. Affect remains flat and mood is depressed. A) Given support, reassurance and praise along with encouragement. Provided with a brief 1:1. R) Denies SI and HI.

## 2015-08-22 DIAGNOSIS — F10230 Alcohol dependence with withdrawal, uncomplicated: Secondary | ICD-10-CM

## 2015-08-22 NOTE — Progress Notes (Signed)
D:Patient in his room in bed reading a book on approach.  Patient states, "no, no and no."  Patient states he was referring to the SI/HI and AVH question.  Patient denies SI/HI and denies AVH.  Patient  Did not engage in conversation with Clinical research associatewriter and did not look up at Clinical research associatewriter from his book.  Patient does appear irritable.   A: Staff to monitor Q 15 mins for safety.  Encouragement and support offered.  Scheduled medications administered per orders. R: Patient remains safe on the unit.  Patient attended group tonight.  Patient minimally visible on the unit tonight.  Patient taking administered medications.

## 2015-08-22 NOTE — Plan of Care (Signed)
Problem: Alteration in mood Goal: STG-Patient reports thoughts of self-harm to staff Outcome: Progressing He denies SI

## 2015-08-22 NOTE — [facility] Group Notes (Signed)
[facility] LCSW Group Therapy  08/22/2015 12:57 PM  Type of Therapy:  Group Therapy  Participation Level:  Active  Participation Quality:  Attentive  Affect:  Appropriate  Cognitive:  Alert  Insight:  Engaged  Engagement in Therapy:  Engaged  Modes of Intervention:  Confrontation, Discussion, Education, Exploration, Problem-solving, Rapport Building, Socialization and Support  Summary of Progress/Problems: Today's Topic: Overcoming Obstacles. Patients identified one short term goal and potential obstacles in reaching this goal. Patients processed barriers involved in overcoming these obstacles. Patients identified steps necessary for overcoming these obstacles and explored motivation (internal and external) for facing these difficulties head on. Curtis FastRonald "Curtis Blankenship" was attentive and engaged during today's processing group. He shared that he "keeps running into roadblocks" regarding his inpatient treatment. "I make too much for one program and another tells me that I have to be seen outpatient first. [facility]." Curtis Blankenship shared that he feels hopeful about [facility] option but states that "a 4-6 month commitment may be longer than I can handle." He continues to show increasing insight and has the ability to problem solve and weigh pros/cons of choices with CSW.   Smart, [name] LCSWA  08/22/2015, 12:57 PM

## 2015-08-22 NOTE — Progress Notes (Signed)
[facility] MD Progress Note  08/22/2015 5:58 PM Nickey Kloepfer  MRN:  [id number] Subjective:  Curtis Blankenship continues to have a hard time with the anxiety, the sleep. He was accepted to go to [facility] sometime in November. He is afraid of being out of here without a clear structure waiting for the bed. He is also worried as he has to come off the Ambien to be able to go to [facility].  He has a very hard time with sleep as it is with the Ambien. Worried that any worsening of his sleep will exacerbate  his PTSD and cause a relapse of his substance abuse. Admits that he is having a hard time focusing. He has to read the same lines over and over again. He would like to go back to school and improve his life but does not know if he can do it given his most recent functioning. Couple of years ago he was able to abstain for 7-8 months and saw things getting better but he did not stay clean soon afterthat Principal Problem: Severe recurrent major depression without psychotic features (HCC) Diagnosis:   Patient Active Problem List   Diagnosis Date Noted  . Uncomplicated alcohol dependence (HCC) [F10.20]   . Severe recurrent major depression without psychotic features (HCC) [F33.2] 08/04/2015  . Hyponatremia [E87.1] 08/02/2015  . Alcohol abuse [F10.10] 08/02/2015  . Suicidal ideation [R45.851] 08/02/2015  . Aspiration pneumonia (HCC) [J69.0] 08/02/2015  . Syncope [R55] 08/02/2015  . Blurred vision [H53.8]   . TIA (transient ischemic attack) [G45.9] 09/19/2013  . Disturbance of skin sensation [R20.9] 09/19/2013  . 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 Alcohol dependence (HCC) [F10.20] 12/01/2012  . Cocaine abuse [F14.10] 12/01/2012  . PTSD (post-traumatic stress disorder) [F43.10] 12/01/2012  . Depressive disorder [F32.9] 12/01/2012   Total Time spent with patient: 30 minutes  Past Psychiatric History: see admission H and P  Past Medical History:  Past Medical History  Diagnosis Date  . Bipolar disorder  (HCC)   . PTSD (post-traumatic stress disorder)   . Depression     Past Surgical History  Procedure Laterality Date  . Appendectomy     Family History:  Family History  Problem Relation Age of Onset  . Hypertension Mother   . Alcoholism Other    Family Psychiatric  History: see admission H and P Social History:  History  Alcohol Use  . 97.2 oz/week  . 150 Cans of beer, 12 Shots of liquor per week    Comment: he drinks every day as much as he can     History  Drug Use  . Yes  . Special: Cocaine, Other-see comments, Benzodiazepines    Social History   Social History  . Marital Status: Single    Spouse Name: N/A  . Number of Children: N/A  . Years of Education: N/A   Social History Main Topics  . Smoking status: Current Every Day Smoker -- 1.50 packs/day for 10 years    Types: Cigarettes  . Smokeless tobacco: Former Neurosurgeon    Types: Snuff  . Alcohol Use: 97.2 oz/week    150 Cans of beer, 12 Shots of liquor per week     Comment: he drinks every day as much as he can  . Drug Use: Yes    Special: Cocaine, Other-see comments, Benzodiazepines  . Sexual Activity: Not Asked   Other Topics Concern  . None   Social History Narrative   Additional Social History:  Sleep: Poor  Appetite:  Fair  Current Medications: Current Facility-Administered Medications  Medication Dose Route Frequency Provider Last Rate Last Dose  . alum & mag hydroxide-simeth (MAALOX/MYLANTA) 200-200-20 MG/5ML suspension 30 mL  30 mL Oral Q4H PRN Nelly RoutArchana Kumar, MD      . buPROPion (WELLBUTRIN XL) 24 hr tablet 300 mg  300 mg Oral Daily Rachael FeeIrving A Sereniti Wan, MD   300 mg at 08/22/15 0853  . hydrOXYzine (ATARAX/VISTARIL) tablet 50 mg  50 mg Oral Q6H PRN Beau FannyJohn C Withrow, FNP   50 mg at 08/21/15 2233  . lamoTRIgine (LAMICTAL) tablet 50 mg  50 mg Oral BID Rachael FeeIrving A Vika Buske, MD   50 mg at 08/22/15 1725  . magnesium hydroxide (MILK OF MAGNESIA) suspension 30 mL  30 mL Oral Daily PRN  Nelly RoutArchana Kumar, MD      . multivitamin with minerals tablet 1 tablet  1 tablet Oral Daily Kerry HoughSpencer E Simon, PA-C   1 tablet at 08/22/15 0853  . prazosin (MINIPRESS) capsule 3 mg  3 mg Oral QHS Beau FannyJohn C Withrow, FNP   3 mg at 08/21/15 2232  . propranolol ER (INDERAL LA) 24 hr capsule 60 mg  60 mg Oral Daily Beau FannyJohn C Withrow, FNP   60 mg at 08/22/15 [id number]  . thiamine (B-1) injection 100 mg  100 mg Intramuscular Once Kerry HoughSpencer E Simon, PA-C   100 mg at 08/03/15 2330  . thiamine (VITAMIN B-1) tablet 100 mg  100 mg Oral Daily Kerry HoughSpencer E Simon, PA-C   100 mg at 08/22/15 [id number]  . zolpidem (AMBIEN) tablet 10 mg  10 mg Oral QHS PRN Rachael FeeIrving A Larya Charpentier, MD   10 mg at 08/21/15 2232    Lab Results: No results found for this or any previous visit (from the past 48 hour(s)).  Physical Findings: AIMS: Facial and Oral Movements Muscles of Facial Expression: None, normal Lips and Perioral Area: None, normal Jaw: None, normal Tongue: None, normal,Extremity Movements Upper (arms, wrists, hands, fingers): None, normal Lower (legs, knees, ankles, toes): None, normal, Trunk Movements Neck, shoulders, hips: None, normal, Overall Severity Severity of abnormal movements (highest score from questions above): None, normal Incapacitation due to abnormal movements: None, normal Patient's awareness of abnormal movements (rate only patient's report): No Awareness, Dental Status Current problems with teeth and/or dentures?: No Does patient usually wear dentures?: No  CIWA:  CIWA-Ar Total: 0 COWS:  COWS Total Score: 0  Musculoskeletal: Strength & Muscle Tone: within normal limits Gait & Station: normal Patient leans: normal  Psychiatric Specialty Exam: Review of Systems  Constitutional: Negative.   HENT: Negative.   Eyes: Negative.   Respiratory: Negative.   Cardiovascular: Negative.   Gastrointestinal: Negative.   Genitourinary: Negative.   Musculoskeletal: Negative.   Skin: Negative.   Endo/Heme/Allergies: Negative.    Psychiatric/Behavioral: Positive for substance abuse. The patient is nervous/anxious and has insomnia.     Blood pressure 103/61, pulse 84, temperature 97.6 F (36.4 C), temperature source Oral, resp. rate 16, height 5\' 11"  (1.803 m), weight 77.111 kg (170 lb).Body mass index is 23.72 kg/(m^2).  General Appearance: Fairly Groomed  Patent attorneyye Contact::  Fair  Speech:  Clear and Coherent  Volume:  fluctuates   Mood:  Anxious and worried  Affect:  anxious worried  Thought Process:  Coherent and Goal Directed  Orientation:  Full (Time, Place, and Person)  Thought Content:  symptoms events worries concerns  Suicidal Thoughts:  No  Homicidal Thoughts:  No  Memory:  Immediate;   Fair Recent;  Fair Remote;   Fair  Judgement:  Fair  Insight:  Present  Psychomotor Activity:  Restlessness  Concentration:  Fair  Recall:  [employer] of Knowledge:Fair  Language: Fair  Akathisia:  No  Handed:  Right  AIMS (if indicated):     Assets:  Desire for Improvement  ADL's:  Intact  Cognition: WNL  Sleep:  Number of Hours: 5.75   Treatment Plan Summary: Daily contact with patient to assess and evaluate symptoms and progress in treatment and Medication management Supportive approach/coping skills Substance Abuse; continue to work a relapse prevention plan Depression; continue the Wellbutrin XL 300 mg in AM PTSD; continue the Minipress 3 mg HS Was started on a trial with Inderal LA 60 mg daily ( will monitor his BP) Insomnia; will have to D/C the Ambien. Will discuss other options Use CBT/mindfulness Shalamar Plourde A 08/22/2015, 5:58 PM

## 2015-08-22 NOTE — [facility] Group Notes (Signed)
[facility] LCSW Aftercare Discharge Planning Group Note   08/22/2015 11:22 AM  Participation Quality:  Appropriate   Mood/Affect:  Appropriate  Depression Rating:  5  Anxiety Rating:  7  Thoughts of Suicide:  No Will you contract for safety?   NA  Current AVH:  No  Plan for Discharge/Comments:  Pt reports that he is "in a funk" this morning. Phone interview with [facility] scheduled for 11:30AM. Pt reports that he called [facility] and "didn't get anyone." Pt also met with Clearnce Sorrel. Pt is currently weighing his options. Pt also left message with [facility] in attempt to get appts scheduled. Pt reports poor sleep last night.   Transportation Means: unknown at this time.   Supports: pt's mother.   Smart, [employer]

## 2015-08-22 NOTE — Progress Notes (Signed)
Patient ID: Curtis Blankenship, male   DOB: 1981-09-10, 34 y.o.   MRN: [id number]  DAR: Pt. Denies SI/HI and A/V Hallucinations. Patient does not report any pain or discomfort at this time. Support and encouragement provided to the patient. Scheduled medications administered to patient per physician's orders. Patient is minimal and irritable. He stays to himself most of the time and is only seen in the milieu occasionally. Q15 minute checks are maintained for safety.

## 2015-08-22 NOTE — Progress Notes (Signed)
Recreation Therapy Notes  Date: 10.24.2016 Time: 9:30am Location: 300 [address] Group Room   Group Topic: Stress Management  Goal Area(s) Addresses:  Patient will actively participate in stress management techniques presented during session.   Behavioral Response: Did not attend.   Curtis Blankenship L Lanae Federer, LRT/CTRS        [name] L 08/22/2015 10:01 AM 

## 2015-08-22 NOTE — Progress Notes (Signed)
Adult Psychoeducational Group Note  Date:  08/22/2015 Time:  8:43 PM  Pt attended evening AA group.    Curtis Blankenship 08/22/2015, 8:43 PM

## 2015-08-22 NOTE — Progress Notes (Addendum)
Pt attended spiritual care group on grief and loss facilitated by chaplain Burnis KingfisherMatthew Dejuan Elman and counseling intern [facility]. Group opened with brief discussion and psycho-social ed around grief and loss in relationships and in relation to self - identifying life patterns, circumstances, changes that cause losses. Established group norm of speaking from own life experience. Group goal of establishing open and affirming space for members to share loss and experience with grief, normalize grief experience and provide psycho social education and grief support.  Group members discussed isolation / loneliness, anger, guilt, effects of substance use on grief process, depression and PTSD.  Group members established connection and identified areas of support with one another.     Curtis Blankenship introduced himself as Curtis Blankenship.  Was present throughout group.  Did not contribute to group discussion.    Curtis Blankenship, Curtis Blankenship

## 2015-08-23 LAB — HCV COMMENT:

## 2015-08-23 LAB — HEPATITIS B CORE ANTIBODY, IGM: Hep B C IgM: NEGATIVE

## 2015-08-23 LAB — HEPATITIS C ANTIBODY (REFLEX): HCV Ab: <0.1 s/co ratio (ref 0.0–0.9)

## 2015-08-23 LAB — HEPATITIS B E ANTIGEN: HEP B E AG: NEGATIVE

## 2015-08-23 MED ORDER — MIRTAZAPINE 7.5 MG PO TABS
7.5000 mg | ORAL_TABLET | Freq: Every day | ORAL | Status: DC
Start: 2015-08-23 — End: 2015-08-29
  Administered 2015-08-23 – 2015-08-28 (×6): 7.5 mg via ORAL
  Filled 2015-08-23 (×9): qty 1

## 2015-08-23 MED ORDER — LAMOTRIGINE 25 MG PO TABS
75.0000 mg | ORAL_TABLET | Freq: Two times a day (BID) | ORAL | Status: DC
  Administered 2015-08-24 – 2015-08-29 (×11): 75 mg via ORAL
  Filled 2015-08-23 (×12): qty 3
  Filled 2015-08-23: qty 1
  Filled 2015-08-23 (×3): qty 3

## 2015-08-23 NOTE — Plan of Care (Signed)
Problem: Diagnosis: Increased Risk For Suicide Attempt Goal: LTG-Patient Will Report Improved Mood and Deny Suicidal LTG (by discharge) Patient will report improved mood and deny suicidal ideation.  Outcome: Progressing Pt still displays the depressed mood, denied SI and verbally contracted for safety.

## 2015-08-23 NOTE — Progress Notes (Signed)
D: Pt continues to be very flat and depressed on the unit today. Pt also continues to be very isolative.  Pt has been irritable and impulsive and unwilling to cooperate with his care. Pt minimisses and would only respond with answer he thinks staff wants to here. Pt denied having any physical pain. Medication given as ordered. Pt reported that his depression was a 0, his hopelessness was a 0, and that his anxiety was a 0. Pt reported being negative SI/HI, no AH/VH noted. A: 15 min checks continued for patient safety. R: Pts safety maintained.

## 2015-08-23 NOTE — Progress Notes (Signed)
Patient became angry this morning when the dinamap machine did not take his blood pressure on the first try.  As the mht was readjusting the b/p cuff patient got up and walked away.  When writer asked patient to stay and get his blood pressure take patient yelled, "I JUST SAT THERE MA'AM AND IT DIDN'T WORK."  Writer explained to patient his VS would be needed before his medication is administered.  Patient walked away from Clinical research associatewriter.  Patient then went to breakfast.

## 2015-08-23 NOTE — [facility] Group Notes (Signed)
[facility] Group Notes:  (Nursing/MHT/Case Management/Adjunct)  Date:  08/23/2015  Time:  0900  Type of Therapy:  Nurse Education - Strategies for Recovery  Participation Level:  None  Participation Quality:  Inattentive  Affect:  Flat and Irritable  Cognitive:  Alert  Insight:  None  Engagement in Group:  Resistant  Modes of Intervention:  Education  Summary of Progress/Problems: Patient attended group however spent most of his time staring out the window. Did not participate in discussion.  Merian CapronFriedman, Alexea Blase [facility]akes 08/23/2015, 0930

## 2015-08-23 NOTE — Progress Notes (Signed)
[facility] MD Progress Note  08/23/2015 8:31 PM Curtis Blankenship  MRN:  [id number] Subjective:  States he feels pretty irritated today. He is isolating. States he cant explain these sudden changes of mood but they usually lead to using. He is having increased sensitivity to the noises the crowd in the cafeteria. He is isolating. He will have to come off the Ambien to [facility] to [facility] Principal Problem: Severe recurrent major depression without psychotic features [facility]) Diagnosis:   Patient Active Problem List   Diagnosis Date Noted  . Alcohol dependence with uncomplicated withdrawal (HCC) [F10.230]   . Uncomplicated alcohol dependence (HCC) [F10.20]   . Severe recurrent major depression without psychotic features (HCC) [F33.2] 08/04/2015  . Hyponatremia [E87.1] 08/02/2015  . Alcohol abuse [F10.10] 08/02/2015  . Suicidal ideation [R45.851] 08/02/2015  . Aspiration pneumonia (HCC) [J69.0] 08/02/2015  . Syncope [R55] 08/02/2015  . Blurred vision [H53.8]   . TIA (transient ischemic attack) [G45.9] 09/19/2013  . Disturbance of skin sensation [R20.9] 09/19/2013  . 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 Alcohol dependence (HCC) [F10.20] 12/01/2012  . Cocaine abuse [F14.10] 12/01/2012  . PTSD (post-traumatic stress disorder) [F43.10] 12/01/2012  . Depressive disorder [F32.9] 12/01/2012   Total Time spent with patient: 30 minutes  Past Psychiatric History: see admission H and P  Past Medical History:  Past Medical History  Diagnosis Date  . Bipolar disorder (HCC)   . PTSD (post-traumatic stress disorder)   . Depression     Past Surgical History  Procedure Laterality Date  . Appendectomy     Family History:  Family History  Problem Relation Age of Onset  . Hypertension Mother   . Alcoholism Other    Family Psychiatric  History: see admission H and P Social History:  History  Alcohol Use  . 97.2 oz/week  . 150 Cans of beer, 12 Shots of liquor per week    Comment: he drinks  every day as much as he can     History  Drug Use  . Yes  . Special: Cocaine, Other-see comments, Benzodiazepines    Social History   Social History  . Marital Status: Single    Spouse Name: N/A  . Number of Children: N/A  . Years of Education: N/A   Social History Main Topics  . Smoking status: Current Every Day Smoker -- 1.50 packs/day for 10 years    Types: Cigarettes  . Smokeless tobacco: Former Neurosurgeon    Types: Snuff  . Alcohol Use: 97.2 oz/week    150 Cans of beer, 12 Shots of liquor per week     Comment: he drinks every day as much as he can  . Drug Use: Yes    Special: Cocaine, Other-see comments, Benzodiazepines  . Sexual Activity: Not Asked   Other Topics Concern  . None   Social History Narrative   Additional Social History:                         Sleep: Poor  Appetite:  Fair  Current Medications: Current Facility-Administered Medications  Medication Dose Route Frequency Provider Last Rate Last Dose  . alum & mag hydroxide-simeth (MAALOX/MYLANTA) 200-200-20 MG/5ML suspension 30 mL  30 mL Oral Q4H PRN Nelly Rout, MD      . buPROPion (WELLBUTRIN XL) 24 hr tablet 300 mg  300 mg Oral Daily Rachael Fee, MD   300 mg at 08/23/15 [phone]  . hydrOXYzine (ATARAX/VISTARIL) tablet 50 mg  50 mg Oral Q6H PRN Beau FannyJohn C Withrow, FNP   50 mg at 08/22/15 2235  . lamoTRIgine (LAMICTAL) tablet 50 mg  50 mg Oral BID Rachael FeeIrving A Chavonne Sforza, MD   50 mg at 08/23/15 1700  . magnesium hydroxide (MILK OF MAGNESIA) suspension 30 mL  30 mL Oral Daily PRN Nelly RoutArchana Kumar, MD      . mirtazapine (REMERON) tablet 7.5 mg  7.5 mg Oral QHS Rachael FeeIrving A Agnes Brightbill, MD      . multivitamin with minerals tablet 1 tablet  1 tablet Oral Daily Kerry HoughSpencer E Simon, PA-C   1 tablet at 08/23/15 [id number]  . prazosin (MINIPRESS) capsule 3 mg  3 mg Oral QHS Beau FannyJohn C Withrow, FNP   3 mg at 08/22/15 2232  . propranolol ER (INDERAL LA) 24 hr capsule 60 mg  60 mg Oral Daily Beau FannyJohn C Withrow, FNP   60 mg at 08/23/15 [id number]  . thiamine  (B-1) injection 100 mg  100 mg Intramuscular Once Kerry HoughSpencer E Simon, PA-C   100 mg at 08/03/15 2330  . thiamine (VITAMIN B-1) tablet 100 mg  100 mg Oral Daily Kerry HoughSpencer E Simon, PA-C   100 mg at 08/23/15 [id number]    Lab Results:  Results for orders placed or performed during the hospital encounter of 08/03/15 (from the past 48 hour(s))  Hepatitis B e antigen     Status: None   Collection Time: 08/22/15  6:50 AM  Result Value Ref Range   Hep B E Ag Negative Negative    Comment: (NOTE) Performed At: [facility] [address] [place], [place] [id number] Mila HomerHancock William F MD NF:[id number] [facility]     Physical Findings: AIMS: Facial and Oral Movements Muscles of Facial Expression: None, normal Lips and Perioral Area: None, normal Jaw: None, normal Tongue: None, normal,Extremity Movements Upper (arms, wrists, hands, fingers): None, normal Lower (legs, knees, ankles, toes): None, normal, Trunk Movements Neck, shoulders, hips: None, normal, Overall Severity Severity of abnormal movements (highest score from questions above): None, normal Incapacitation due to abnormal movements: None, normal Patient's awareness of abnormal movements (rate only patient's report): No Awareness, Dental Status Current problems with teeth and/or dentures?: No Does patient usually wear dentures?: No  CIWA:  CIWA-Ar Total: 0 COWS:  COWS Total Score: 0  Musculoskeletal: Strength & Muscle Tone: within normal limits Gait & Station: normal Patient leans: normal  Psychiatric Specialty Exam: Review of Systems  Constitutional: Negative.   HENT: Negative.   Eyes: Negative.   Respiratory: Negative.   Cardiovascular: Negative.   Gastrointestinal: Negative.   Genitourinary: Negative.   Musculoskeletal: Negative.   Skin: Negative.   Neurological: Negative.   Endo/Heme/Allergies: Negative.   Psychiatric/Behavioral: Positive for depression and substance abuse. The patient  is nervous/anxious and has insomnia.     Blood pressure 129/81, pulse 68, temperature 97.4 F (36.3 C), temperature source Oral, resp. rate 16, height 5\' 11"  (1.803 m), weight 77.111 kg (170 lb).Body mass index is 23.72 kg/(m^2).  General Appearance: Fairly Groomed  Patent attorneyye Contact::  Fair  Speech:  Clear and Coherent  Volume:  fluctuates  Mood:  Anxious, Depressed and Dysphoric  Affect:  Restricted  Thought Process:  Coherent and Goal Directed  Orientation:  Full (Time, Place, and Person)  Thought Content:  symptoms events worries concerns  Suicidal Thoughts:  No  Homicidal Thoughts:  No  Memory:  Immediate;   Fair Recent;   Fair Remote;   Fair  Judgement:  Fair  Insight:  Present  Psychomotor Activity:  Restlessness  Concentration:  Fair  Recall:  [employer] of Knowledge:Fair  Language: Fair  Akathisia:  No  Handed:  Right  AIMS (if indicated):     Assets:  Desire for Improvement  ADL's:  Intact  Cognition: WNL  Sleep:  Number of Hours: 7   Treatment Plan Summary: Daily contact with patient to assess and evaluate symptoms and progress in treatment and Medication management Supportive approach/coping skills Insomnia; will D/C the Ambien and have a trial with Remeron 7.5 mg HS Mood instability; will increase the Lamictal to 75 mg BID Nightmares; will still continue the minipress at 3 mg. When it was increased to 4 mg his BP dropped. He is on Inderal thinking that it could help the overall hyperreactivity. Will continue to monitor his BP, pulse Use CBT/mindfulness Curtis Blankenship A 08/23/2015, 8:31 PM

## 2015-08-23 NOTE — Progress Notes (Signed)
Patient ID: Curtis Blankenship, male   DOB: [birth year], 34 y.o.   MRN: [id number] PER STATE REGULATIONS 482.30  THIS CHART WAS REVIEWED FOR MEDICAL NECESSITY WITH RESPECT TO THE PATIENT'S ADMISSION/ DURATION OF STAY.  NEXT REVIEW DATE:  08/27/2015  Willa RoughJENNIFER JONES Curtis Orlich, RN, BSN CASE MANAGER

## 2015-08-23 NOTE — [facility] Group Notes (Signed)
[facility] LCSW Group Therapy  08/23/2015 1:07 PM  Type of Therapy:  Group Therapy  Participation Level:  Active  Participation Quality:  Attentive  Affect:  Appropriate  Cognitive:  Alert and Oriented  Insight:  Improving  Engagement in Therapy:  Improving  Modes of Intervention:  Discussion, Education, Exploration, Problem-solving, Rapport Building, Socialization and Support  Summary of Progress/Problems: [facility] Speaker came to talk about his personal journey with substance abuse and addiction. The pt processed ways by which to relate to the speaker. MHA speaker provided handouts and educational information pertaining to groups and services offered by the [facility].   Smart, Ezra Marquess LCSWA 08/23/2015, 1:07 PM

## 2015-08-23 NOTE — Progress Notes (Signed)
Pt attended the AA speaker meeting. Pt was attentive and engaged. 

## 2015-08-24 NOTE — Progress Notes (Signed)
Recreation Therapy Notes  Date: 10.26.2016 Time: 9:30am Location: [address] Group Room   Group Topic: Stress Management  Goal Area(s) Addresses:  Patient will actively participate in stress management techniques presented during session.   Behavioral Response: Did not attend.   Curtis Blankenship L Horacio Werth, LRT/CTRS        Mellany Dinsmore L 08/24/2015 2:50 PM 

## 2015-08-24 NOTE — Progress Notes (Signed)
Patient complaining of fatigue this morning and indicates he did not sleep well with the initiation of remeron. "I liked my ambien. It worked." He continues to be avoidant at times of others though does have a good relationship with certain peers. Presently rating his depression at a 7/10, hopelessness at an 8/10 and anxiety at a 3/10. Indicates his goal is to "discharge" and he plans to meet that goal by "making calls." Patient encouraged to speak with MD regarding sleep concerns. Discussed patient's status with MD, tx team. Medicated per orders. Offered emotional support though patient minimally receptive. Denies SI/HI and remains safe. Will redirect as needed. Lawrence MarseillesFriedman, Randell Teare Eakes

## 2015-08-24 NOTE — Progress Notes (Signed)
D: Pt was guarded in Conservation officer, natureinteraction writer. Pt denies SI/HI/AVH prior to Clinical research associatewriter engaging in a conversation. Pt showed reluctance to speaking with writer outside of the dayroom. Pt denied any concerns he wished for this to address at this time. Pt informed that his Lamictal dose was increased.   A: Writer administered scheduled and prn medications to pt, per MD orders. Continued support and availability as needed was extended to this pt. Staff continues to monitor pt with q5515min checks.  R: No adverse drug reactions noted. Pt receptive to treatment. Pt remains safe at this time.

## 2015-08-24 NOTE — Progress Notes (Signed)
[facility] MD Progress Note  08/24/2015 6:02 PM Curtis Blankenship  MRN:  [id number] Subjective:  Curtis Blankenship is having a hard time at night with the nightmares. He did not have the Ambien last night and had a hard time with sleep overall. He is still experiencing lability of mood. He has a bed at [facility] on November the 4th. He is worried of being out there without a structure. The relationship with his mother  is very conflictive and he states he cant count with her.  Principal Problem: Severe recurrent major depression without psychotic features (HCC) Diagnosis:   Patient Active Problem List   Diagnosis Date Noted  . Alcohol dependence with uncomplicated withdrawal (HCC) [F10.230]   . Uncomplicated alcohol dependence (HCC) [F10.20]   . Severe recurrent major depression without psychotic features (HCC) [F33.2] 08/04/2015  . Hyponatremia [E87.1] 08/02/2015  . Alcohol abuse [F10.10] 08/02/2015  . Suicidal ideation [R45.851] 08/02/2015  . Aspiration pneumonia (HCC) [J69.0] 08/02/2015  . Syncope [R55] 08/02/2015  . Blurred vision [H53.8]   . TIA (transient ischemic attack) [G45.9] 09/19/2013  . Disturbance of skin sensation [R20.9] 09/19/2013  . 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 Alcohol dependence (HCC) [F10.20] 12/01/2012  . Cocaine abuse [F14.10] 12/01/2012  . PTSD (post-traumatic stress disorder) [F43.10] 12/01/2012  . Depressive disorder [F32.9] 12/01/2012   Total Time spent with patient: 30 minutes  Past Psychiatric History: see admission H and P  Past Medical History:  Past Medical History  Diagnosis Date  . Bipolar disorder (HCC)   . PTSD (post-traumatic stress disorder)   . Depression     Past Surgical History  Procedure Laterality Date  . Appendectomy     Family History:  Family History  Problem Relation Age of Onset  . Hypertension Mother   . Alcoholism Other    Family Psychiatric  History: see admission H and P Social History:  History  Alcohol Use  . 97.2 oz/week  .  150 Cans of beer, 12 Shots of liquor per week    Comment: he drinks every day as much as he can     History  Drug Use  . Yes  . Special: Cocaine, Other-see comments, Benzodiazepines    Social History   Social History  . Marital Status: Single    Spouse Name: N/A  . Number of Children: N/A  . Years of Education: N/A   Social History Main Topics  . Smoking status: Current Every Day Smoker -- 1.50 packs/day for 10 years    Types: Cigarettes  . Smokeless tobacco: Former Neurosurgeon    Types: Snuff  . Alcohol Use: 97.2 oz/week    150 Cans of beer, 12 Shots of liquor per week     Comment: he drinks every day as much as he can  . Drug Use: Yes    Special: Cocaine, Other-see comments, Benzodiazepines  . Sexual Activity: Not Asked   Other Topics Concern  . None   Social History Narrative   Additional Social History:                         Sleep: Poor  Appetite:  Fair  Current Medications: Current Facility-Administered Medications  Medication Dose Route Frequency Provider Last Rate Last Dose  . alum & mag hydroxide-simeth (MAALOX/MYLANTA) 200-200-20 MG/5ML suspension 30 mL  30 mL Oral Q4H PRN Nelly Rout, MD      . buPROPion (WELLBUTRIN XL) 24 hr tablet 300 mg  300 mg Oral Daily Madie Reno A  Dub Mikes, MD   300 mg at 08/24/15 1610  . hydrOXYzine (ATARAX/VISTARIL) tablet 50 mg  50 mg Oral Q6H PRN Beau Fanny, FNP   50 mg at 08/23/15 2331  . lamoTRIgine (LAMICTAL) tablet 75 mg  75 mg Oral BID Rachael Fee, MD   75 mg at 08/24/15 1615  . magnesium hydroxide (MILK OF MAGNESIA) suspension 30 mL  30 mL Oral Daily PRN Nelly Rout, MD      . mirtazapine (REMERON) tablet 7.5 mg  7.5 mg Oral QHS Rachael Fee, MD   7.5 mg at 08/23/15 2221  . multivitamin with minerals tablet 1 tablet  1 tablet Oral Daily Kerry Hough, PA-C   1 tablet at 08/24/15 1615  . prazosin (MINIPRESS) capsule 3 mg  3 mg Oral QHS Beau Fanny, FNP   3 mg at 08/23/15 2220  . propranolol ER (INDERAL LA) 24  hr capsule 60 mg  60 mg Oral Daily Beau Fanny, FNP   Stopped at 08/24/15 0800  . thiamine (B-1) injection 100 mg  100 mg Intramuscular Once Kerry Hough, PA-C   100 mg at 08/03/15 2330  . thiamine (VITAMIN B-1) tablet 100 mg  100 mg Oral Daily Kerry Hough, PA-C   100 mg at 08/24/15 9604    Lab Results: No results found for this or any previous visit (from the past 48 hour(s)).  Physical Findings: AIMS: Facial and Oral Movements Muscles of Facial Expression: None, normal Lips and Perioral Area: None, normal Jaw: None, normal Tongue: None, normal,Extremity Movements Upper (arms, wrists, hands, fingers): None, normal Lower (legs, knees, ankles, toes): None, normal, Trunk Movements Neck, shoulders, hips: None, normal, Overall Severity Severity of abnormal movements (highest score from questions above): None, normal Incapacitation due to abnormal movements: None, normal Patient's awareness of abnormal movements (rate only patient's report): No Awareness, Dental Status Current problems with teeth and/or dentures?: No Does patient usually wear dentures?: No  CIWA:  CIWA-Ar Total: 0 COWS:  COWS Total Score: 0  Musculoskeletal: Strength & Muscle Tone: within normal limits Gait & Station: normal Patient leans: normal  Psychiatric Specialty Exam: Review of Systems  Constitutional: Negative.   HENT: Negative.   Eyes: Negative.   Respiratory: Negative.   Cardiovascular: Negative.   Gastrointestinal: Negative.   Genitourinary: Negative.   Musculoskeletal: Negative.   Skin: Negative.   Neurological: Negative.   Endo/Heme/Allergies: Negative.   Psychiatric/Behavioral: Positive for depression and substance abuse. The patient is nervous/anxious and has insomnia.     Blood pressure 98/57, pulse 89, temperature 97.5 F (36.4 C), temperature source Oral, resp. rate 17, height  (1.803 m), weight 77.111 kg (170 lb).Body mass index is 23.72 kg/(m^2).  General Appearance: Fairly  Groomed  Patent attorney::  Fair  Speech:  Clear and Coherent  Volume:  fluctuates  Mood:  Anxious and Irritable  Affect:  Labile  Thought Process:  Coherent and Goal Directed  Orientation:  Full (Time, Place, and Person)  Thought Content:  symptoms events worries concerns  Suicidal Thoughts:  No  Homicidal Thoughts:  No  Memory:  Immediate;   Fair Recent;   Fair Remote;   Fair  Judgement:  Fair  Insight:  Present  Psychomotor Activity:  Restlessness  Concentration:  Fair  Recall:  [employer] of Knowledge:Fair  Language: Fair  Akathisia:  No  Handed:  Right  AIMS (if indicated):     Assets:  Desire for Improvement  ADL's:  Intact  Cognition:  WNL  Sleep:  Number of Hours: 7   Treatment Plan Summary: Daily contact with patient to assess and evaluate symptoms and progress in treatment and Medication management Supportive approach/coping skills Substance dependence; continue to work a relapse prevention plan Depression; continue to work with the wellbutrin XL 300 mg daily Mood instability; reassess the effectiveness of the increase of Lamictal to 75 mg BID PTSD; continue to explore benefit from the Inderal Nightmares; optimize response to the Prazosin 3 mg Use CBT/mindfulness Pattie Flaharty A 08/24/2015, 6:02 PM

## 2015-08-24 NOTE — [facility] Group Notes (Signed)
[facility] LCSW Group Therapy  08/24/2015 12:31 PM  Type of Therapy:  Group Therapy  Participation Level:  Active  Participation Quality:  Attentive  Affect:  Appropriate  Cognitive:  Alert and Oriented  Insight:  Improving  Engagement in Therapy:  Improving  Modes of Intervention:  Confrontation, Discussion, Education, Exploration, Problem-solving, Rapport Building, Socialization and Support  Summary of Progress/Problems: Today's Topic: Overcoming Obstacles. Patients identified one short term goal and potential obstacles in reaching this goal. Patients processed barriers involved in overcoming these obstacles. Patients identified steps necessary for overcoming these obstacles and explored motivation (internal and external) for facing these difficulties head on. Curtis Blankenship was attentive and engaged during today's processing group. He shared that his obstacle invovles putting down his defenses in order to grow and return to the person he used to be. Curtis Blankenship talked about going through many traumas and experiences that shaped him into someone who puts up walls. He was attentive and engaged throughout group.   Smart, Fredie Majano LCSWA  08/24/2015, 12:31 PM

## 2015-08-24 NOTE — Progress Notes (Signed)
Pt attended the NA speaker meeting. Pt was engaged and attentive. 

## 2015-08-24 NOTE — Plan of Care (Signed)
Problem: Diagnosis: Increased Risk For Suicide Attempt Goal: LTG-Patient Will Report Absence of Withdrawal Symptoms LTG (by discharge): Patient will report absence of withdrawal symptoms.  Outcome: Completed/Met Date Met:  08/24/15 Patient denies withdrawal.  Problem: Alteration in mood Goal: STG-Pt Able to Identify Plan For Continuing Care at D/C Pt. Will be able to identify a plan for continuing care at discharge  Outcome: Progressing Patient aware he has a bed at [facility] on 11/4

## 2015-08-24 NOTE — [facility] Group Notes (Signed)
[facility]   08/24/2015 11:19 AM  Participation Quality:  Pt DID NOT ATTEND. Chose to remain in bed this morning.   Smart, American FinancialHeather LCSWA

## 2015-08-25 LAB — HEPATITIS C VRS RNA DETECT BY PCR-QUAL: Hepatitis C Vrs RNA by PCR-Qual: NEGATIVE

## 2015-08-25 NOTE — [facility] Group Notes (Signed)
The focus of this group is to educate the patient on the purpose and policies of crisis stabilization and provide a format to answer questions about their admission.  The group details unit policies and expectations of patients while admitted.  Patient did not attend 0900 nurse education orientation this morning.  Patient stayed in bed.  

## 2015-08-25 NOTE — Progress Notes (Signed)
[facility]  08/25/2015 5:53 PM Curtis HamRonald Blankenship  MRN:  [id number] Subjective:  Curtis PilgrimJacob continues to have a hard time. He is trying to get his life back together. He has had some frustration as things have no gone as smoothly as initially planned. He has been quite irritable. He was wanting to stay with his mother for couple of days before he was to go to treatment to pick some of his things up given he will be gone for several months and she refused to have him at her house. He felt rejected "one more time" he is still endorsing the nightmares. He tends to isolate and avoid when he cant handle being around the other patients. He states he is really afraid of being out there without structure and support Principal Problem: Severe recurrent major depression without psychotic features (HCC) Diagnosis:   Patient Active Problem List   Diagnosis Date Noted  . Alcohol dependence with uncomplicated withdrawal (HCC) [F10.230]   . Uncomplicated alcohol dependence (HCC) [F10.20]   . Severe recurrent major depression without psychotic features (HCC) [F33.2] 08/04/2015  . Hyponatremia [E87.1] 08/02/2015  . Alcohol abuse [F10.10] 08/02/2015  . Suicidal ideation [R45.851] 08/02/2015  . Aspiration pneumonia (HCC) [J69.0] 08/02/2015  . Syncope [R55] 08/02/2015  . Blurred vision [H53.8]   . TIA (transient ischemic attack) [G45.9] 09/19/2013  . Disturbance of skin sensation [R20.9] 09/19/2013  . 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 Alcohol dependence (HCC) [F10.20] 12/01/2012  . Cocaine abuse [F14.10] 12/01/2012  . PTSD (post-traumatic stress disorder) [F43.10] 12/01/2012  . Depressive disorder [F32.9] 12/01/2012   Total Time spent with patient: 30 minutes  Past Psychiatric History: see admission H and P  Past Medical History:  Past Medical History  Diagnosis Date  . Bipolar disorder (HCC)   . PTSD (post-traumatic stress disorder)   . Depression     Past Surgical History  Procedure Laterality  Date  . Appendectomy     Family History:  Family History  Problem Relation Age of Onset  . Hypertension Mother   . Alcoholism Other    Family Psychiatric  History: see admission H and P Social History:  History  Alcohol Use  . 97.2 oz/week  . 150 Cans of beer, 12 Shots of liquor per week    Comment: he drinks every day as much as he can     History  Drug Use  . Yes  . Special: Cocaine, Other-see comments, Benzodiazepines    Social History   Social History  . Marital Status: Single    Spouse Name: N/A  . Number of Children: N/A  . Years of Education: N/A   Social History Main Topics  . Smoking status: Current Every Day Smoker -- 1.50 packs/day for 10 years    Types: Cigarettes  . Smokeless tobacco: Former NeurosurgeonUser    Types: Snuff  . Alcohol Use: 97.2 oz/week    150 Cans of beer, 12 Shots of liquor per week     Comment: he drinks every day as much as he can  . Drug Use: Yes    Special: Cocaine, Other-see comments, Benzodiazepines  . Sexual Activity: Not Asked   Other Topics Concern  . None   Social History Narrative   Additional Social History:                         Sleep: Poor  Appetite:  Fair  Current Medications: Current Facility-Administered Medications  Medication Dose Route  Frequency Provider Last Rate Last Dose  . alum & mag hydroxide-simeth (MAALOX/MYLANTA) 200-200-20 MG/5ML suspension 30 mL  30 mL Oral Q4H PRN Nelly Rout, MD      . buPROPion (WELLBUTRIN XL) 24 hr tablet 300 mg  300 mg Oral Daily Rachael Fee, MD   300 mg at 08/25/15 0830  . hydrOXYzine (ATARAX/VISTARIL) tablet 50 mg  50 mg Oral Q6H PRN Beau Fanny, FNP   50 mg at 08/24/15 2218  . lamoTRIgine (LAMICTAL) tablet 75 mg  75 mg Oral BID Rachael Fee, MD   75 mg at 08/25/15 1611  . magnesium hydroxide (MILK OF MAGNESIA) suspension 30 mL  30 mL Oral Daily PRN Nelly Rout, MD      . mirtazapine (REMERON) tablet 7.5 mg  7.5 mg Oral QHS Rachael Fee, MD   7.5 mg at 08/24/15  2217  . multivitamin with minerals tablet 1 tablet  1 tablet Oral Daily Kerry Hough, PA-C   1 tablet at 08/25/15 0830  . prazosin (MINIPRESS) capsule 3 mg  3 mg Oral QHS Beau Fanny, FNP   3 mg at 08/24/15 2217  . propranolol ER (INDERAL LA) 24 hr capsule 60 mg  60 mg Oral Daily Beau Fanny, FNP   60 mg at 08/25/15 0829  . thiamine (B-1) injection 100 mg  100 mg Intramuscular Once Kerry Hough, PA-C   100 mg at 08/03/15 2330  . thiamine (VITAMIN B-1) tablet 100 mg  100 mg Oral Daily Kerry Hough, PA-C   100 mg at 08/25/15 0830    Lab Results: No results found for this or any previous visit (from the past 48 hour(s)).  Physical Findings: AIMS: Facial and Oral Movements Muscles of Facial Expression: None, normal Lips and Perioral Area: None, normal Jaw: None, normal Tongue: None, normal,Extremity Movements Upper (arms, wrists, hands, fingers): None, normal Lower (legs, knees, ankles, toes): None, normal, Trunk Movements Neck, shoulders, hips: None, normal, Overall Severity Severity of abnormal movements (highest score from questions above): None, normal Incapacitation due to abnormal movements: None, normal Patient's awareness of abnormal movements (rate only patient's report): No Awareness, Dental Status Current problems with teeth and/or dentures?: No Does patient usually wear dentures?: No  CIWA:  CIWA-Ar Total: 0 COWS:  COWS Total Score: 0  Musculoskeletal: Strength & Muscle Tone: within normal limits Gait & Station: normal Patient leans: normal  Psychiatric Specialty Exam: Review of Systems  Constitutional: Negative.   HENT: Negative.   Eyes: Negative.   Respiratory: Negative.   Cardiovascular: Negative.   Gastrointestinal: Negative.   Genitourinary: Negative.   Musculoskeletal: Negative.   Skin: Negative.   Endo/Heme/Allergies: Negative.   Psychiatric/Behavioral: Positive for substance abuse. The patient is nervous/anxious and has insomnia.     Blood  pressure 110/65, pulse 106, temperature 97.6 F (36.4 C), temperature source Oral, resp. rate 18, height  (1.803 m), weight 77.111 kg (170 lb).Body mass index is 23.72 kg/(m^2).  General Appearance: Fairly Groomed  Patent attorney::  Fair  Speech:  Clear and Coherent  Volume:  fluctuates  Mood:  Anxious, Irritable and frustrated  Affect:  anxious frustrated  Thought Process:  Coherent and Goal Directed  Orientation:  Full (Time, Place, and Person)  Thought Content:  symptoms events worries concerns  Suicidal Thoughts:  No  Homicidal Thoughts:  No  Memory:  Immediate;   Fair Recent;   Fair Remote;   Fair  Judgement:  Fair  Insight:  Present  Psychomotor  Activity:  Restlessness  Concentration:  Fair  Recall:  [employer] of Knowledge:Fair  Language: Fair  Akathisia:  No  Handed:  Right  AIMS (if indicated):     Assets:  Desire for Improvement  ADL's:  Intact  Cognition: WNL  Sleep:  Number of Hours: 5   Treatment Plan Summary: Daily contact with patient to assess and evaluate symptoms and progress in treatment and Medication management Supportive approach/coping skills Alcohol cocaine abuse-dependence; continue to work a relapse prevention plan Depression; continue to work with the Wellbutrin XL 300 mg in AM PTSD; work with the Prazosin 3 mg/ Inderal LA 60 mg Sleep; will increase the Remeron to 15 mg HS Mood instability; will work with the Lamictal 75 mg BID Work with CBT/mindfulness Facilitate admission to the residential treatment center Dillin Lofgren A 08/25/2015, 5:53 PM

## 2015-08-25 NOTE — Progress Notes (Signed)
D- Patient has been isolative to room the shift minimally engaged in unit activities.  Patient denies SI/HI and AVH.  Patient is spent the majority of the shift focusing on talking with the social worker and making plans to discharge.  Patient had no incidents of behavioral dyscontrol.   A- assess patient for safety, offer medications as prescribed, assess for effectiveness of medications and monitor for side effects.  Encourage patient to attend groups.   R-  Patient was compliant with medications. Patient was able to contract for safety this shift.

## 2015-08-25 NOTE — Progress Notes (Signed)
D. Pt had been up and visible in milieu this evening, did attend and participate in evening group activity. Pt spoke about issues with sleep and spoke about how he is unable to take [place] any longer as the treatment facility he is going to will not allow it. Pt has indeed had a difficult time with sleep this evening and has had a difficult time staying asleep this evening. A. Support provided. R. Safety maintained.

## 2015-08-25 NOTE — [facility] Group Notes (Signed)
[facility] LCSW Group Therapy  08/25/2015 4:18 PM  Type of Therapy:  Group Therapy  Participation Level:  Active  Participation Quality:  Attentive  Affect:  Appropriate  Cognitive:  Alert and Oriented  Insight:  Improving  Engagement in Therapy:  Improving  Modes of Intervention:  Confrontation, Discussion, Education, Exploration, Problem-solving, Rapport Building, Socialization and Support  Summary of Progress/Problems:  Finding Balance in Life. Today's group focused on defining balance in one's own words, identifying things that can knock one off balance, and exploring healthy ways to maintain balance in life. Group members were asked to provide an example of a time when they felt off balance, describe how they handled that situation,and process healthier ways to regain balance in the future. Group members were asked to share the most important tool for maintaining balance that [facility] and how they plan to apply this method after discharge. Windy FastRonald "Gerilyn PilgrimJacob" was attentive and engaged during today's processing group. He stated that he felt like he was moving toward balance after getting word that he was accepted for treatment on Nov 4th. "my plane ticket is booked." He is hoping to d/c earlier to get "other things arranged" and expressed his anger toward his mother for "Giving me a hard time." Gerilyn PilgrimJacob was able to problem solve with CSW and manage his mood lability.   Smart, Marcelyn Ruppe LCSWA 08/25/2015, 4:18 PM

## 2015-08-26 NOTE — Progress Notes (Signed)
Patient did attend the evening speaker AA meeting.  

## 2015-08-26 NOTE — [facility] Group Notes (Signed)
[facility]   08/26/2015 9:50 AM  Participation Quality:  Appropriate   Mood/Affect:  Appropriate  Depression Rating:  2  Anxiety Rating:  8  Thoughts of Suicide:  No Will you contract for safety?   NA  Current AVH:  No  Plan for Discharge/Comments:  Pt reports that he is happy to have bed secured at [facility] for 11/4. Pt reports getting in fight with his mother last night and is angry with her. CSW informed him that pt's mother spoke with CSW and reported similar issues. Pt's mother willing to take him to airport and allow him to get his belongings from the house. Pt reports poor sleep.   Transportation Means: unknown at this time/cab?  Supports: mother/strained relationship  Smart, OncologistHeather LCSWA

## 2015-08-26 NOTE — Progress Notes (Signed)
D.  Pt pleasant on approach, brief interaction.  Pt denies complaints at this time.  Pt did attend evening AA group, interacting appropriately with peers on the unit.  Up in dayroom with peers watching TV until dayroom closed.  Denies SI/HI/hallucinations at this time.  A.  Support and encouragement offered  R.  Pt remains safe on the unit, will continue to monitor.

## 2015-08-26 NOTE — Progress Notes (Signed)
D. Pt had been up and visible in milieu this evening, did not attend evening group activity. Pt was seen interacting appropriately and did receive medications without incident. A. Support provided. R. Safety maintained.

## 2015-08-26 NOTE — [facility] Group Notes (Signed)
[facility] LCSW Group Therapy  08/26/2015 1:07 PM  Type of Therapy:  Group Therapy  Participation Level:  Did Not Attend-pt invited. Chose to remain in room.   Modes of Intervention:  Confrontation, Discussion, Education, Problem-solving, Rapport Building, Socialization and Support  Summary of Progress/Problems: Feelings around Relapse. Group members discussed the meaning of relapse and shared personal stories of relapse, how it affected them and others, and how they perceived themselves during this time. Group members were encouraged to identify triggers, warning signs and coping skills used when facing the possibility of relapse. Social supports were discussed and explored in detail. Post Acute Withdrawal Syndrome (handout provided) was introduced and examined. Pt's were encouraged to ask questions, talk about key points associated with PAWS, and process this information in terms of relapse prevention.   Smart, Teyana Pierron LCSWA 08/26/2015, 1:07 PM

## 2015-08-26 NOTE — Progress Notes (Signed)
[facility] 7a-7p shift:   D:  Pt. Has been guarded, irritable, and agitated at times during this shift, stating.  He stated that he "just wanted to sleep" because he was tired.  He can be slightly sarcastic and short with his answers at times, and is resistant to talking about his problems.  He states "I don't want to talk about it; I've been here far too long!"  Pt becomes increasingly agitated when staff and some peers interact with him.  He had gotten upset while waiting in the line to go to dinner (during which time a peer was joking and laughing loudly), and walked quickly back to his room.  Pt  later requested and received vistaril for agitation, but did not explain why he was angry.  He then carefully examined his water cup before drinking, saying "There's a lot of people who don't like me; I just want to make sure no one spit in my water."   A: Support and "space" provided as needed.  Level 3 checks continued for safety and patient offered food after taking vistaril.  R: Pt. Unreceptive/minimally  to most intervention/s, but did eat his dinner, before going back to his room.  Safety maintained.  Joaquin MusicMary Deaunte Dente, RN

## 2015-08-26 NOTE — Tx Team (Signed)
Interdisciplinary Treatment Plan Update (Adult)  Date:  08/26/2015  Time Reviewed:  10:09 AM   Progress in Treatment: Attending groups: Yes  Participating in groups:  Yes  Taking medication as prescribed:  Yes. Tolerating medication:  Yes. Family/Significant othe contact made:  SPE completed with both pt and his mother.  Patient understands diagnosis:  Yes. and As evidenced by:  seeking treatment for SI with overdose of cocaine, alcohol abuse, depression, and medication stabilization. Discussing patient identified problems/goals with staff:  Yes. Medical problems stabilized or resolved:  Yes. Denies suicidal/homicidal ideation: Yes. Self report.  Issues/concerns per patient self-inventory:  Other:  Discharge Plan or Barriers: Pt accepted to [facility] for 11/4. Flight has been scheduled. Pt wants to d/c on Mon to "get things in order." His mother is worried about this and wants him to d/c on Tuesday. Pt and mother have been arguing by phone.   Reason for Continuation of Hospitalization: Anxiety  Medication stabilization  Comments:  Curtis Blankenship is a 34 y.o. male with history of alcohol and cocaine abuse presented to the ER because of suicidal ideation. Patient states he has been drinking alcohol everyday and last evening had taken extra dose of cocaine with intention of suicide. Patient states that he passed out following the last dose of cocaine. When he woke up he started developing chest pressure which lasted for 1 hour and resolved. Patient has been having some left-sided blurred vision since then. Patient hired a cab and came to the ER. Prior to which patient also had one episode of nausea vomiting. In the ER patient's labs revealed positive cocaine with mild hyponatremia and chest x-ray showing possible infiltrate. Patient was placed on suicide precaution and admitted for further management.  Denies any shortness of breath and chest pain at this time is resolved.  Still complains of some left-sided blurred vision.  Estimated length of stay:  3-4 days   Additional Comments:  Patient and CSW reviewed pt's identified goals and treatment plan. Patient verbalized understanding and agreed to treatment plan. CSW reviewed [facility] "Discharge Process and Patient Involvement" Form. Pt verbalized understanding of information provided and signed form.    Review of initial/current patient goals per problem list:  1. Goal(s): Patient will participate in aftercare plan  Met: No.   Target date: at discharge  As evidenced by: Patient will participate within aftercare plan AEB aftercare provider and housing plan at discharge being identified.  10/6: CSW assessing for appropriate referrals.   10/11: Pt requesting referral to [facility] CSW assessing.   10/14: [facility]. Review is on Tuesday for [facility].   10/21: Denied for inpatient programs through [place] and [place]. Denied psych bed that became open. Pending referral through [facility]-pt not wanting to accept at this point. CSW continuing to assess.   10/28: [facility] accepted for [facility].   2. Goal (s): Patient will exhibit decreased depressive symptoms and suicidal ideations.  Met: Yes    Target date: at discharge  As evidenced by: Patient will utilize self rating of depression at 3 or below and demonstrate decreased signs of depression or be deemed stable for discharge by MD.  10/6: Pt rates depression as high to day. Denies SI/HI/AVH.   10/11: Pt continues to rate depression as high today with no SI.   10/14: Pt rates depression as high today and presents with irritable mood.   10/21: Pt rates depression as 2/10 but now  rates anxiety as 7/10 with some mood instability. Pt appears to be nearing his baseline.    3. Goal(s): Patient will demonstrate decreased signs of withdrawal due to substance abuse  Met:Yes    Target date:at discharge   As evidenced by: Patient will produce a CIWA/COWS score of 0, have stable vitals signs, and no symptoms of withdrawal.    10/6: Pt reports moderate withdrawals with CIWA score of 11 and high sitting BP.    10/11: Pt reports no signs of withdrawal with CIWA/COWS of 0 and stable vitals.   Attendees: Patient:   08/26/2015 10:09 AM   Family:   08/26/2015 10:09 AM   Physician:  Dr. Carlton Adam, MD 08/26/2015 10:09 AM   Nursing: Elio Forget RN 08/26/2015 10:09 AM   Clinical Social Worker: Maxie Better, [place]  08/26/2015 10:09 AM   Clinical Social Worker: Erasmo Downer Drinkard LCSWA  08/26/2015 10:09 AM   Other:  Gerline Legacy Nurse Case Manager 08/26/2015 10:09 AM   Other:  Lucinda Dell; [facility]  08/26/2015 10:09 AM   Other:   08/26/2015 10:09 AM   Other:  08/26/2015 10:09 AM   Other:  08/26/2015 10:09 AM   Other:  08/26/2015 10:09 AM    08/26/2015 10:09 AM    08/26/2015 10:09 AM    08/26/2015 10:09 AM    08/26/2015 10:09 AM    Scribe for Treatment Team:   Maxie Better, [place]  08/26/2015 10:09 AM

## 2015-08-26 NOTE — Progress Notes (Signed)
Patient ID: Curtis Blankenship, male   DOB: 04-10-1981, 34 y.o.   MRN: [id number] [facility] MD Progress Note  08/26/2015 6:16 PM Curtis Blankenship  MRN:  [id number] Subjective:  Patient states that compared to admission he is better. He reports chronic PTSD symptoms , stemming from combat experiences in [place], while in the [employer].  He states he has gradually become better able to manage these symptoms. At this time states his depression is partially improved . Objective : I have discussed case with treatment team and have met with patient. 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 At this time improved compared to admission. As discussed with staff, the plan at present is for [facility] to [facility] as of early next week. He is motivated in this plan. On unit, presents calm, cooperative, pleasant upon approach. No disruptive or agitated behaviors noted or reported. Group participation is limited.  Currently not endorsing medication side effects.  Principal Problem: Severe recurrent major depression without psychotic features (Curtis Blankenship) Diagnosis:   Patient Active Problem List   Diagnosis Date Noted  . Alcohol dependence with uncomplicated withdrawal ([place]) [F10.230]   . Uncomplicated alcohol dependence ([place]) [F10.20]   . Severe recurrent major depression without psychotic features ([place]) [F33.2] 08/04/2015  . Hyponatremia [E87.1] 08/02/2015  . Alcohol abuse [F10.10] 08/02/2015  . Suicidal ideation [R45.851] 08/02/2015  . Aspiration pneumonia ([place]) [J69.0] 08/02/2015  . Syncope [R55] 08/02/2015  . Blurred vision [H53.8]   . TIA (transient ischemic attack) [G45.9] 09/19/2013  . Disturbance of skin sensation [R20.9] 09/19/2013  . 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 Alcohol dependence ([place]) [F10.20] 12/01/2012  . Cocaine abuse [F14.10] 12/01/2012  . PTSD (post-traumatic  stress disorder) [F43.10] 12/01/2012  . Depressive disorder [F32.9] 12/01/2012   Total Time spent with patient:  25 minutes  Past Psychiatric History: see admission H and P  Past Medical History:  Past Medical History  Diagnosis Date  . Bipolar disorder ([place])   . PTSD (post-traumatic stress disorder)   . Depression     Past Surgical History  Procedure Laterality Date  . Appendectomy     Family History:  Family History  Problem Relation Age of Onset  . Hypertension Mother   . Alcoholism Other    Family Psychiatric  History: see admission H and P Social History:  History  Alcohol Use  . 97.2 oz/week  . 150 Cans of beer, 12 Shots of liquor per week    Comment: he drinks every day as much as he can     History  Drug Use  . Yes  . Special: Cocaine, Other-see comments, Benzodiazepines    Social History   Social History  . Marital Status: Single    Spouse Name: N/A  . Number of Children: N/A  . Years of Education: N/A   Social History Main Topics  . Smoking status: Current Every Day Smoker -- 1.50 packs/day for 10 years    Types: Cigarettes  . Smokeless tobacco: Former Systems developer    Types: Snuff  . Alcohol Use: 97.2 oz/week    150 Cans of beer, 12 Shots of liquor per week     Comment: he drinks every day as much as he can  . Drug Use: Yes    Special: Cocaine, Other-see comments, Benzodiazepines  . Sexual Activity: Not Asked   Other Topics Concern  . None  Social History Narrative   Additional Social History:   Sleep:  Fair  Appetite:  Fair  Current Medications: Current Facility-Administered Medications  Medication Dose Route Frequency Provider Last Rate Last Dose  . alum & mag hydroxide-simeth (MAALOX/MYLANTA) 200-200-20 MG/5ML suspension 30 mL  30 mL Oral Q4H PRN Hampton Abbot, MD      . buPROPion (WELLBUTRIN XL) 24 hr tablet 300 mg  300 mg Oral Daily Nicholaus Bloom, MD   300 mg at 08/26/15 0835  . hydrOXYzine (ATARAX/VISTARIL) tablet 50 mg  50 mg Oral Q6H  PRN Benjamine Mola, FNP   50 mg at 08/26/15 1750  . lamoTRIgine (LAMICTAL) tablet 75 mg  75 mg Oral BID Nicholaus Bloom, MD   75 mg at 08/26/15 1725  . magnesium hydroxide (MILK OF MAGNESIA) suspension 30 mL  30 mL Oral Daily PRN Hampton Abbot, MD      . mirtazapine (REMERON) tablet 7.5 mg  7.5 mg Oral QHS Nicholaus Bloom, MD   7.5 mg at 08/25/15 2232  . multivitamin with minerals tablet 1 tablet  1 tablet Oral Daily Laverle Hobby, PA-C   1 tablet at 08/26/15 7001  . prazosin (MINIPRESS) capsule 3 mg  3 mg Oral QHS Benjamine Mola, FNP   3 mg at 08/25/15 2232  . propranolol ER (INDERAL LA) 24 hr capsule 60 mg  60 mg Oral Daily Benjamine Mola, FNP   60 mg at 08/26/15 0835  . thiamine (B-1) injection 100 mg  100 mg Intramuscular Once Laverle Hobby, PA-C   100 mg at 08/03/15 2330  . thiamine (VITAMIN B-1) tablet 100 mg  100 mg Oral Daily Laverle Hobby, PA-C   100 mg at 08/26/15 7494    Lab Results: No results found for this or any previous visit (from the past 48 hour(s)).  Physical Findings: AIMS: Facial and Oral Movements Muscles of Facial Expression: None, normal Lips and Perioral Area: None, normal Jaw: None, normal Tongue: None, normal,Extremity Movements Upper (arms, wrists, hands, fingers): None, normal Lower (legs, knees, ankles, toes): None, normal, Trunk Movements Neck, shoulders, hips: None, normal, Overall Severity Severity of abnormal movements (highest score from questions above): None, normal Incapacitation due to abnormal movements: None, normal Patient's awareness of abnormal movements (rate only patient's report): No Awareness, Dental Status Current problems with teeth and/or dentures?: No Does patient usually wear dentures?: No  CIWA:  CIWA-Ar Total: 0 COWS:  COWS Total Score: 0  Musculoskeletal: Strength & Muscle Tone: within normal limits Gait & Station: normal Patient leans: normal  Psychiatric Specialty Exam: Review of Systems  Constitutional: Negative.    HENT: Negative.   Eyes: Negative.   Respiratory: Negative.   Cardiovascular: Negative.   Gastrointestinal: Negative.   Genitourinary: Negative.   Musculoskeletal: Negative.   Skin: Negative.   Neurological: Negative.   Endo/Heme/Allergies: Negative.   Psychiatric/Behavioral: Positive for depression and substance abuse. The patient is nervous/anxious and has insomnia.     Blood pressure 110/61, pulse 99, temperature 97.9 F (36.6 C), temperature source Oral, resp. rate 18, height '5\' 11"'  (1.803 m), weight 170 lb (77.111 kg).Body mass index is 23.72 kg/(m^2).  General Appearance: Well Groomed  Engineer, water::  Good  Speech:  Clear and Coherent  Volume:  Normal  Mood:   Reports improved mood   Affect:   Anxious, but reactive, smiles at times appropriately  Thought Process:  Coherent and Goal Directed  Orientation:  Full (Time, Place, and Person)  Thought Content:  No hallucinations, no delusions, not internally preoccupied   Suicidal Thoughts:  No- at this time denies suicidal ideations, denies any self injurious ideations , contracts for safety on the unit   Homicidal Thoughts:  No  Memory:   Recent and remote grossly intact   Judgement:  Other:  improving   Insight:  Present  Psychomotor Activity:   Less restless - at this time presents calm, no psychomotor agitation  Concentration:  Fair  Recall:  Good  Fund of Knowledge:Good  Language: Good  Akathisia:  No  Handed:  Right  AIMS (if indicated):     Assets:  Desire for Improvement  ADL's:  Intact  Cognition: WNL  Sleep:  Number of Hours: 5  Assessment - patient is improved compared to admission- reports partially improved mood, no ongoing suicidal ideations, a subjective sense of being better able to control and handle chronic PTSD related symptoms, and increased motivation in working on sobriety, abstinence. At this time tolerating medications well. Treatment Plan Summary: Daily contact with patient to assess and evaluate  symptoms and progress in treatment and Medication management Supportive approach/coping skills Continue to encourage increased milieu participation to work on coping skills and symptom reduction Substance dependence; continue to work a relapse prevention plan Depression;  Continue Wellbutrin  XL 300 mg  QDAY  And Remeron 7.5 mgrs QHS Mood instability : Continue  Lamictal  75 mg BID PTSD/ Anxiety:  continue  Inderal 60 mgrs QDAY  PTSD related Nightmares continue  Prazosin 3 mg QHS  At this time disposition plan is for patient to [facility]  COBOS, FERNANDO 08/26/2015, 6:16 PM

## 2015-08-27 NOTE — Progress Notes (Addendum)
D.  Pt pleasant on approach, labile at times.  Pt seen interacting with peers on the unit, laughing in dayroom with peers.  Pt positive for evening wrap up group, appears less depressed this evening.  Denies SI/HI/hallucinations at this time.  A.  Support and encouragement offered  R.  Pt remains safe on the unit, will continue to monitor.

## 2015-08-27 NOTE — Plan of Care (Signed)
Problem: Alteration in mood Goal: LTG-Pt's behavior demonstrates decreased signs of depression (Patient's behavior demonstrates decreased signs of depression to the point the patient is safe to return home and continue treatment in an outpatient setting)  Outcome: Progressing Pt has been visible in dayroom laughing and interacting with peers this weekend

## 2015-08-27 NOTE — Progress Notes (Signed)
Patient ID: Curtis Blankenship, male   DOB: Oct 18, 1981, 34 y.o.   MRN: [id number] [facility] MD Progress Note  08/27/2015 12:08 PM Curtis Blankenship  MRN:  [id number] Subjective:  Patient states that compared to admission he is better. He reports chronic PTSD symptoms , stemming from combat experiences in [place], while in the [employer].  He states he has gradually become better able to manage these symptoms. At this time states his depression is partially improved . Objective : I have discussed case with treatment team and have met with patient. Tolerating meds and attending grou. 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 At this time improved compared to admission. As discussed with staff, the plan at present is for [facility] as of early next week. He is motivated in this plan. On unit, presents calm, cooperative, pleasant upon approach. No disruptive or agitated behaviors noted or reported. Group participation is limited.  Currently not endorsing medication side effects.  Principal Problem: Severe recurrent major depression without psychotic features ([place]) Diagnosis:   Patient Active Problem List   Diagnosis Date Noted  . Alcohol dependence with uncomplicated withdrawal ([place]) [F10.230]   . Uncomplicated alcohol dependence ([place]) [F10.20]   . Severe recurrent major depression without psychotic features ([place]) [F33.2] 08/04/2015  . Hyponatremia [E87.1] 08/02/2015  . Alcohol abuse [F10.10] 08/02/2015  . Suicidal ideation [R45.851] 08/02/2015  . Aspiration pneumonia ([place]) [J69.0] 08/02/2015  . Syncope [R55] 08/02/2015  . Blurred vision [H53.8]   . TIA (transient ischemic attack) [G45.9] 09/19/2013  . Disturbance of skin sensation [R20.9] 09/19/2013  . 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 Alcohol dependence ([place]) [F10.20] 12/01/2012  . Cocaine abuse [F14.10]  12/01/2012  . PTSD (post-traumatic stress disorder) [F43.10] 12/01/2012  . Depressive disorder [F32.9] 12/01/2012   Total Time spent with patient:  25 minutes  Past Psychiatric History: see admission H and P  Past Medical History:  Past Medical History  Diagnosis Date  . Bipolar disorder ([place])   . PTSD (post-traumatic stress disorder)   . Depression     Past Surgical History  Procedure Laterality Date  . Appendectomy     Family History:  Family History  Problem Relation Age of Onset  . Hypertension Mother   . Alcoholism Other    Family Psychiatric  History: see admission H and P Social History:  History  Alcohol Use  . 97.2 oz/week  . 150 Cans of beer, 12 Shots of liquor per week    Comment: he drinks every day as much as he can     History  Drug Use  . Yes  . Special: Cocaine, Other-see comments, Benzodiazepines    Social History   Social History  . Marital Status: Single    Spouse Name: N/A  . Number of Children: N/A  . Years of Education: N/A   Social History Main Topics  . Smoking status: Current Every Day Smoker -- 1.50 packs/day for 10 years    Types: Cigarettes  . Smokeless tobacco: Former Systems developer    Types: Snuff  . Alcohol Use: 97.2 oz/week    150 Cans of beer, 12 Shots of liquor per week     Comment: he drinks every day as much as he can  . Drug Use: Yes    Special: Cocaine, Other-see comments, Benzodiazepines  . Sexual Activity: Not Asked   Other  Topics Concern  . None   Social History Narrative   Additional Social History:   Sleep:  Fair  Appetite:  Fair  Current Medications: Current Facility-Administered Medications  Medication Dose Route Frequency Provider Last Rate Last Dose  . alum & mag hydroxide-simeth (MAALOX/MYLANTA) 200-200-20 MG/5ML suspension 30 mL  30 mL Oral Q4H PRN Hampton Abbot, MD      . buPROPion (WELLBUTRIN XL) 24 hr tablet 300 mg  300 mg Oral Daily Nicholaus Bloom, MD   300 mg at 08/27/15 0921  . hydrOXYzine  (ATARAX/VISTARIL) tablet 50 mg  50 mg Oral Q6H PRN Benjamine Mola, FNP   50 mg at 08/26/15 1750  . lamoTRIgine (LAMICTAL) tablet 75 mg  75 mg Oral BID Nicholaus Bloom, MD   75 mg at 08/27/15 9628  . magnesium hydroxide (MILK OF MAGNESIA) suspension 30 mL  30 mL Oral Daily PRN Hampton Abbot, MD      . mirtazapine (REMERON) tablet 7.5 mg  7.5 mg Oral QHS Nicholaus Bloom, MD   7.5 mg at 08/26/15 2231  . multivitamin with minerals tablet 1 tablet  1 tablet Oral Daily Laverle Hobby, PA-C   1 tablet at 08/27/15 3662  . prazosin (MINIPRESS) capsule 3 mg  3 mg Oral QHS Benjamine Mola, FNP   3 mg at 08/26/15 2231  . propranolol ER (INDERAL LA) 24 hr capsule 60 mg  60 mg Oral Daily Benjamine Mola, FNP   60 mg at 08/27/15 9476  . thiamine (B-1) injection 100 mg  100 mg Intramuscular Once Laverle Hobby, PA-C   100 mg at 08/03/15 2330  . thiamine (VITAMIN B-1) tablet 100 mg  100 mg Oral Daily Laverle Hobby, PA-C   100 mg at 08/27/15 5465    Lab Results: No results found for this or any previous visit (from the past 48 hour(s)).  Physical Findings: AIMS: Facial and Oral Movements Muscles of Facial Expression: None, normal Lips and Perioral Area: None, normal Jaw: None, normal Tongue: None, normal,Extremity Movements Upper (arms, wrists, hands, fingers): None, normal Lower (legs, knees, ankles, toes): None, normal, Trunk Movements Neck, shoulders, hips: None, normal, Overall Severity Severity of abnormal movements (highest score from questions above): None, normal Incapacitation due to abnormal movements: None, normal Patient's awareness of abnormal movements (rate only patient's report): No Awareness, Dental Status Current problems with teeth and/or dentures?: No Does patient usually wear dentures?: No  CIWA:  CIWA-Ar Total: 0 COWS:  COWS Total Score: 0  Musculoskeletal: Strength & Muscle Tone: within normal limits Gait & Station: normal Patient leans: normal  Psychiatric Specialty  Exam: Review of Systems  Cardiovascular: Negative for chest pain.  Musculoskeletal: Negative.   Skin: Negative for rash.  Psychiatric/Behavioral: Positive for depression and substance abuse. The patient is nervous/anxious.     Blood pressure 112/74, pulse 80, temperature 97.6 F (36.4 C), temperature source Oral, resp. rate 16, height '5\' 11"'  (1.803 m), weight 77.111 kg (170 lb).Body mass index is 23.72 kg/(m^2).  General Appearance: Well Groomed  Engineer, water::  Good  Speech:  Clear and Coherent  Volume:  Normal  Mood:   Less dysphoric  Affect:   Anxious somewhat  Thought Process:  Coherent and Goal Directed  Orientation:  Full (Time, Place, and Person)  Thought Content:   No hallucinations, no delusions, not internally preoccupied   Suicidal Thoughts:  No- at this time denies suicidal ideations, denies any self injurious ideations , contracts for safety on the  unit   Homicidal Thoughts:  No  Memory:   Recent and remote grossly intact   Judgement:  Other:  improving   Insight:  Present  Psychomotor Activity:   Less restless - at this time presents calm, no psychomotor agitation  Concentration:  Fair  Recall:  Good  Fund of Knowledge:Good  Language: Good  Akathisia:  No  Handed:  Right  AIMS (if indicated):     Assets:  Desire for Improvement  ADL's:  Intact  Cognition: WNL  Sleep:  Number of Hours: 4.75  Assessment - patient is improved compared to admission- reports partially improved mood, no ongoing suicidal ideations, a subjective sense of being better able to control and handle chronic PTSD related symptoms, and increased motivation in working on sobriety, abstinence. At this time tolerating medications well. Treatment Plan Summary: Daily contact with patient to assess and evaluate symptoms and progress in treatment and Medication management Supportive approach/coping skills Continue to encourage increased milieu participation to work on coping skills and symptom  reduction Substance dependence; continue to work a relapse prevention plan Depression;  Continue Wellbutrin  XL 300 mg  QDAY  And Remeron 7.5 mgrs QHS Mood instability : Continue  Lamictal  75 mg BID PTSD/ Anxiety:  continue  Inderal 60 mgrs QDAY  PTSD related Nightmares continue  Prazosin 3 mg QHS  No change in medications today At this time disposition plan is for patient to [facility]  Timira Bieda 08/27/2015, 12:08 PM

## 2015-08-27 NOTE — [facility] Group Notes (Signed)
[facility] Group Notes: (Clinical Social Work)   08/27/2015      Type of Therapy:  Group Therapy   Participation Level:  Did Not Attend despite MHT prompting   Adyn Serna Grossman-[place]rr, LCSW 08/27/2015, 12:14 PM     

## 2015-08-27 NOTE — Plan of Care (Signed)
Problem: Alteration in mood & ability to function due to Goal: STG-Patient will attend groups Outcome: Progressing Pt has attended group this weekend     

## 2015-08-27 NOTE — [facility] Group Notes (Signed)
[facility] Group Notes:  (Nursing/MHT/Case Management/Adjunct)  Date:  08/27/2015  Time:  3:57 PM  Type of Therapy:  Psychoeducational Skills  Participation Level:  Did Not Attend  Participation Quality:  Did Not Attend  Affect:  Did Not Attend  Cognitive:  Did Not Attend  Insight:  None  Engagement in Group:  Did Not Attend  Modes of Intervention:  Did Not Attend  Summary of Progress/Problems: Pt did not attend patient self inventory group.  Jacquelyne BalintForrest, Donita Newland Shanta 08/27/2015, 3:57 PM

## 2015-08-27 NOTE — Progress Notes (Signed)
Patient ID: Curtis Blankenship, male   DOB: 1981-08-31, 34 y.o.   MRN: [id number]   D: Pt has been very flat and depressed on the unit today. Pt did not attend any groups nor did he engage in any treatment. Pt slept most of the day, he only got up for meds and food. Pt reported that his depression was a 0, his hopelessness was a 0, and his anxiety was a 0. Pt reported that he had no goal for today. Pt reported being negative SI/HI, no AH/VH noted. A: 15 min checks continued for patient safety. R: Pt safety maintained.

## 2015-08-28 NOTE — Progress Notes (Signed)
[facility] Group Notes:  (Nursing/MHT/Case Management/Adjunct)  Date:  08/28/2015  Time:  1:38 AM  Type of Therapy:  Psychoeducational Skills  Participation Level:  Minimal  Participation Quality:  Attentive  Affect:  Blunted  Cognitive:  Appropriate  Insight:  Appropriate  Engagement in Group:  Developing/Improving  Modes of Intervention:  Education  Summary of Progress/Problems: Patient described his day as having been "stagnant" and "uneventful". He states that he is waiting to be discharged to a long-term facility and that his been has already been secured. In terms of theme for the day, his coping skill will be to work on his long-term sobriety.   [name] S 08/28/2015, 1:38 AM

## 2015-08-28 NOTE — Progress Notes (Signed)
Patient ID: Curtis Blankenship, male   DOB: April 04, 1981, 35 y.o.   MRN: [id number] [facility] MD Progress Note  08/28/2015 11:27 AM Curtis Blankenship  MRN:  [id number] Subjective:  Patient states that compared to admission he is better. He reports chronic PTSD symptoms , stemming from combat experiences in [place], while in the [employer].  He states he has gradually become better able to manage these symptoms. At this time states his depression is partially improved . Objective : I have discussed case with treatment team and have met with patient. Tolerating meds and attending grou. 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 At this time improved compared to admission. As discussed with staff, the plan at present [facility] next [facility]. He is motivated in this plan. Plan remains the same, his sleep, energy level better and remains compliant.  On unit, presents calm, cooperative, pleasant upon approach. No disruptive or agitated behaviors noted or reported. Group participation is limited.  Currently not endorsing medication side effects.  Principal Problem: Severe recurrent major depression without psychotic features ([place]) Diagnosis:   Patient Active Problem List   Diagnosis Date Noted  . Alcohol dependence with uncomplicated withdrawal ([place]) [F10.230]   . Uncomplicated alcohol dependence ([place]) [F10.20]   . Severe recurrent major depression without psychotic features ([place]) [F33.2] 08/04/2015  . Hyponatremia [E87.1] 08/02/2015  . Alcohol abuse [F10.10] 08/02/2015  . Suicidal ideation [R45.851] 08/02/2015  . Aspiration pneumonia ([place]) [J69.0] 08/02/2015  . Syncope [R55] 08/02/2015  . Blurred vision [H53.8]   . TIA (transient ischemic attack) [G45.9] 09/19/2013  . Disturbance of skin sensation [R20.9] 09/19/2013  . 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 Alcohol dependence ([place]) [F10.20] 12/01/2012  . Cocaine abuse [F14.10] 12/01/2012  . PTSD (post-traumatic stress disorder) [F43.10] 12/01/2012  . Depressive disorder [F32.9] 12/01/2012   Total Time spent with patient:  25 minutes  Past Psychiatric History: see admission H and P  Past Medical History:  Past Medical History  Diagnosis Date  . Bipolar disorder ([place])   . PTSD (post-traumatic stress disorder)   . Depression     Past Surgical History  Procedure Laterality Date  . Appendectomy     Family History:  Family History  Problem Relation Age of Onset  . Hypertension Mother   . Alcoholism Other    Family Psychiatric  History: see admission H and P Social History:  History  Alcohol Use  . 97.2 oz/week  . 150 Cans of beer, 12 Shots of liquor per week    Comment: he drinks every day as much as he can     History  Drug Use  . Yes  . Special: Cocaine, Other-see comments, Benzodiazepines    Social History   Social History  . Marital Status: Single    Spouse Name: N/A  . Number of Children: N/A  . Years of Education: N/A   Social History Main Topics  . Smoking status: Current Every Day Smoker -- 1.50 packs/day for 10 years    Types: Cigarettes  . Smokeless tobacco: Former Systems developer    Types: Snuff  . Alcohol Use: 97.2 oz/week    150 Cans of beer, 12 Shots of liquor per week     Comment: he drinks every day as much as he can  . Drug Use: Yes    Special:  Cocaine, Other-see comments, Benzodiazepines  . Sexual Activity: Not Asked   Other Topics Concern  . None   Social History Narrative   Additional Social History:   Sleep:  Fair  Appetite:  Fair  Current Medications: Current Facility-Administered Medications  Medication Dose Route Frequency Provider Last Rate Last Dose  . alum & mag hydroxide-simeth (MAALOX/MYLANTA) 200-200-20 MG/5ML suspension 30 mL  30 mL Oral Q4H PRN Curtis Abbot, MD      . buPROPion (WELLBUTRIN XL) 24 hr tablet 300 mg   300 mg Oral Daily Curtis Bloom, MD   300 mg at 08/28/15 0813  . hydrOXYzine (ATARAX/VISTARIL) tablet 50 mg  50 mg Oral Q6H PRN Curtis Mola, FNP   50 mg at 08/27/15 2224  . lamoTRIgine (LAMICTAL) tablet 75 mg  75 mg Oral BID Curtis Bloom, MD   75 mg at 08/28/15 0813  . magnesium hydroxide (MILK OF MAGNESIA) suspension 30 mL  30 mL Oral Daily PRN Curtis Abbot, MD      . mirtazapine (REMERON) tablet 7.5 mg  7.5 mg Oral QHS Curtis Bloom, MD   7.5 mg at 08/27/15 2220  . multivitamin with minerals tablet 1 tablet  1 tablet Oral Daily Curtis Hobby, PA-C   1 tablet at 08/28/15 0813  . prazosin (MINIPRESS) capsule 3 mg  3 mg Oral QHS Curtis Mola, FNP   3 mg at 08/27/15 2220  . propranolol ER (INDERAL LA) 24 hr capsule 60 mg  60 mg Oral Daily Curtis Mola, FNP   60 mg at 08/28/15 0813  . thiamine (B-1) injection 100 mg  100 mg Intramuscular Once Curtis Hobby, PA-C   100 mg at 08/03/15 2330  . thiamine (VITAMIN B-1) tablet 100 mg  100 mg Oral Daily Curtis Hobby, PA-C   100 mg at 08/28/15 8850    Lab Results: No results found for this or any previous visit (from the past 48 hour(s)).  Physical Findings: AIMS: Facial and Oral Movements Muscles of Facial Expression: None, normal Lips and Perioral Area: None, normal Jaw: None, normal Tongue: None, normal,Extremity Movements Upper (arms, wrists, hands, fingers): None, normal Lower (legs, knees, ankles, toes): None, normal, Trunk Movements Neck, shoulders, hips: None, normal, Overall Severity Severity of abnormal movements (highest score from questions above): None, normal Incapacitation due to abnormal movements: None, normal Patient's awareness of abnormal movements (rate only patient's report): No Awareness, Dental Status Current problems with teeth and/or dentures?: No Does patient usually wear dentures?: No  CIWA:  CIWA-Ar Total: 0 COWS:  COWS Total Score: 0  Musculoskeletal: Strength & Muscle Tone: within normal limits Gait  & Station: normal Patient leans: normal  Psychiatric Specialty Exam: Review of Systems  Constitutional: Negative.   Cardiovascular: Negative for chest pain.  Musculoskeletal: Negative.   Skin: Negative for rash.  Psychiatric/Behavioral: Positive for depression and substance abuse.    Blood pressure 107/75, pulse 90, temperature 97.8 F (36.6 C), temperature source Oral, resp. rate 16, height _0  (1.803 m), weight 77.111 kg (170 lb).Body mass index is 23.72 kg/(m^2).  General Appearance: Well Groomed  Engineer, water::  Good  Speech:  Clear and Coherent  Volume:  Normal  Mood:   Less dysphoric  Affect:   Less guarded  Thought Process:  Coherent and Goal Directed  Orientation:  Full (Time, Place, and Person)  Thought Content:   No hallucinations, no delusions, not internally preoccupied   Suicidal Thoughts:  No- at this time denies suicidal  ideations, denies any self injurious ideations , contracts for safety on the unit   Homicidal Thoughts:  No  Memory:   Recent and remote grossly intact   Judgement:  Other:  improving   Insight:  Present  Psychomotor Activity:   Less restless - at this time presents calm, no psychomotor agitation  Concentration:  Fair  Recall:  Good  Fund of Knowledge:Good  Language: Good  Akathisia:  No  Handed:  Right  AIMS (if indicated):     Assets:  Desire for Improvement  ADL's:  Intact  Cognition: WNL  Sleep:  Number of Hours: 5  Assessment - patient is improved compared to admission- reports partially improved mood, no ongoing suicidal ideations, a subjective sense of being better able to control and handle chronic PTSD related symptoms, and increased motivation in working on sobriety, abstinence. At this time tolerating medications well. Treatment Plan Summary: Daily contact with patient to assess and evaluate symptoms and progress in treatment and Medication management Supportive approach/coping skills Continue to encourage increased milieu  participation to work on coping skills and symptom reduction Substance dependence; continue to work a relapse prevention plan Depression;  Continue Wellbutrin  XL 300 mg  QDAY  And Remeron 7.5 mgrs QHS Mood instability : Continue  Lamictal  75 mg BID PTSD/ Anxiety:  continue  Inderal 60 mgrs QDAY  PTSD related Nightmares continue  Prazosin 3 mg QHS  No change in medications today At this time disposition plan is for patient to go [facility]  Rj Pedrosa 08/28/2015, 11:27 AM

## 2015-08-28 NOTE — [facility] Group Notes (Signed)
[facility] Group Notes: (Clinical Social Work)   08/28/2015      Type of Therapy:  Group Therapy   Participation Level:  Did Not Attend despite MHT prompting   Curtis MantleMareida Grossman-Orr, LCSW 08/28/2015, 1:18 PM

## 2015-08-28 NOTE — [facility] Group Notes (Signed)
[facility] Group Notes:  (Nursing/MHT/Case Management/Adjunct)  Date:  08/28/2015  Time:  1000  Type of Therapy:  Nurse Education - Healthy Support Systems  Participation Level:  Did Not Attend  Participation Quality:    Affect:    Cognitive:    Insight:    Engagement in Group:    Modes of Intervention:    Summary of Progress/Problems: Patient was invited however elected to remain in room.  Curtis Blankenship, [facility]hip [facility] 08/28/2015, [phone]

## 2015-08-28 NOTE — Progress Notes (Signed)
D:Affect is appropriate to mood. States that he focused on discharge and feels that he will be ready to leave on Monday or Tuesday . Says that he has a workable plan and will try to stick to it. A:Support and encouragement offered. R:Receptive. No complaints of pain or problems at this time.

## 2015-08-28 NOTE — Progress Notes (Signed)
Patient did attend the evening speaker AA meeting.  

## 2015-08-29 MED ORDER — LAMOTRIGINE 25 MG PO TABS: 75.0000 mg | ORAL_TABLET | Freq: Two times a day (BID) | ORAL | Status: AC

## 2015-08-29 MED ORDER — BUPROPION HCL ER (XL) 300 MG PO TB24: 300.0000 mg | ORAL_TABLET | Freq: Every day | ORAL | Status: AC

## 2015-08-29 MED ORDER — HYDROXYZINE HCL 50 MG PO TABS: 50.0000 mg | ORAL_TABLET | Freq: Four times a day (QID) | ORAL | Status: AC | PRN

## 2015-08-29 MED ORDER — PRAZOSIN HCL 1 MG PO CAPS: 3.0000 mg | ORAL_CAPSULE | Freq: Every day | ORAL | Status: AC

## 2015-08-29 MED ORDER — MIRTAZAPINE 7.5 MG PO TABS
7.5000 mg | ORAL_TABLET | Freq: Every day | ORAL | Status: AC
Start: 2015-08-29 — End: ?

## 2015-08-29 MED ORDER — PROPRANOLOL HCL ER 60 MG PO CP24: 60.0000 mg | ORAL_CAPSULE | Freq: Every day | ORAL | Status: AC

## 2015-08-29 NOTE — Discharge Summary (Signed)
Physician Discharge Summary Note  Patient:  Curtis Blankenship is an 34 y.o., male MRN:  [id number] DOB:  1981/05/05 Patient phone:  [phone] (home)  [place]   [address] [place] [id number],  Total Time spent with patient: 45 minutes  Date of Admission:  08/03/2015 Date of Discharge: 08/29/2015  Reason for Admission:   History of Present Illness:: 34 Y/o male who states he had been living in [place] for the last several months. . [place] [place] [facility] and was staying with his mother. Has been drinking a case a day for month or two as well as using cocaine IV. States he wants to kill himself. Admits he has researched ways of doing it. Ht is obsessed with finding effective ways of killing himself. [place] where [place] do assisted suicide. States he does not want to live anymore.   The initial assessment is as follows: Curtis Blankenship is a 34 y.o. Male, 100% service-connected disabled veteran from [place], seen face-to-face for psychiatric consultation and evaluation of increased symptoms of substance abuse, depression, suicidal ideation and tried to overdose on cocaine to end his life and came to the hospital with chest pain. Patient reported he ran out of his medication for posttraumatic stress disorder and started drinking alcohol up to one case of beer a day and also started using cocaine and overdosed with intention to end his life before coming to the hospital. Reportedly he was [facility] [place] [facility] [facility] the past about 2 months ago. Patient endorses increased symptoms of depression, anxiety, paranoia, reexperiencing of trauma and nightmares. Past Psychiatric History: patient has multiple acute psychiatric hospitalizations in the past, [facility] [place] [facility] [place]. Patient has 1 previous acute psychiatric [place] a year ago.  Principal Problem: Severe recurrent major depression without psychotic features [facility](HCC) Discharge Diagnoses: Patient Active Problem List   Diagnosis Date Noted  . Severe recurrent major depression without psychotic features (HCC) [F33.2] 08/04/2015    Priority: High  . Uncomplicated alcohol dependence (HCC) [F10.20]     Priority: Medium  . Alcohol dependence (HCC) [F10.20] 12/01/2012    Priority: Medium  . Cocaine abuse [F14.10] 12/01/2012    Priority: Medium  . PTSD (post-traumatic stress disorder) [F43.10] 12/01/2012    Priority: Medium  . Alcohol dependence with uncomplicated withdrawal (HCC) [F10.230]   . Hyponatremia [E87.1] 08/02/2015  . Alcohol abuse [F10.10] 08/02/2015  . Suicidal ideation [R45.851] 08/02/2015  . Aspiration pneumonia (HCC) [J69.0] 08/02/2015  . Syncope [R55] 08/02/2015  . Blurred vision [H53.8]   . TIA (transient ischemic attack) [G45.9] 09/19/2013  . Disturbance of skin sensation [R20.9] 09/19/2013  . 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 Depressive disorder [F32.9] 12/01/2012    Musculoskeletal: Strength & Muscle Tone: within normal limits Gait & Station: normal Patient leans: N/A  Psychiatric Specialty Exam: Physical Exam  Review of Systems  Psychiatric/Behavioral: Positive for depression and substance abuse. Negative for suicidal ideas and hallucinations. The patient is nervous/anxious and has insomnia.   All other systems reviewed and are negative.   Blood pressure 113/74, pulse 89, temperature 98.9 F (37.2 C), temperature source Oral, resp. rate 16, height 5\' 11"  (1.803 m), weight 77.111 kg (170 lb).Body mass index is 23.72 kg/(m^2).  SEE MD PSE within the SRA   Have you used any form of tobacco in the  last 30 days? (Cigarettes, Smokeless Tobacco, Cigars, and/or Pipes): Yes  Has this patient used any form of tobacco in the last 30 days? (Cigarettes, Smokeless Tobacco, Cigars, and/or Pipes) No  Past Medical  History:  Past Medical History  Diagnosis Date  . Bipolar disorder (HCC)   . PTSD (post-traumatic stress disorder)   . Depression     Past Surgical History  Procedure Laterality Date  . Appendectomy     Family History:  Family History  Problem Relation Age of Onset  . Hypertension Mother   . Alcoholism Other    Social History:  History  Alcohol Use  . 97.2 oz/week  . 150 Cans of beer, 12 Shots of liquor per week    Comment: he drinks every day as much as he can     History  Drug Use  . Yes  . Special: Cocaine, Other-see comments, Benzodiazepines    Social History   Social History  . Marital Status: Single    Spouse Name: N/A  . Number of Children: N/A  . Years of Education: N/A   Social History Main Topics  . Smoking status: Current Every Day Smoker -- 1.50 packs/day for 10 years    Types: Cigarettes  . Smokeless tobacco: Former Neurosurgeon    Types: Snuff  . Alcohol Use: 97.2 oz/week    150 Cans of beer, 12 Shots of liquor per week     Comment: he drinks every day as much as he can  . Drug Use: Yes    Special: Cocaine, Other-see comments, Benzodiazepines  . Sexual Activity: Not Asked   Other Topics Concern  . None   Social History Narrative    Risk to Self: Is patient at risk for suicide?: No Risk to Others:   Prior Inpatient Therapy:   Prior Outpatient Therapy:    Level of Care:  OP  Hospital Course:   Curtis Blankenship was admitted for Severe recurrent major depression without psychotic features (HCC) , and crisis management.  Pt was treated discharged with the medications listed below under Medication List.  Medical problems were identified and treated as needed.  Home medications were restarted as appropriate.  Improvement was monitored by observation and Curtis Blankenship 's daily report of symptom reduction.  Emotional and mental status was monitored by daily self-inventory reports completed by Curtis Blankenship and clinical staff.         Curtis Blankenship  was evaluated by the treatment team for stability and plans for continued recovery upon discharge. Curtis Blankenship 's motivation was an integral factor for scheduling further treatment. Employment, transportation, bed availability, health status, family support, and any pending legal issues were also considered during hospital stay. Pt was offered further treatment options upon discharge including but not limited to Residential, Intensive Outpatient, and Outpatient treatment.  Curtis Blankenship will follow up with the services as listed below under Follow Up Information.     Upon completion of this admission the patient was both mentally and medically stable for discharge denying suicidal/homicidal ideation, auditory/visual/tactile hallucinations, delusional thoughts and paranoia.    Consults:  None  Significant Diagnostic Studies:  UDS positive for cocaine, BAL 23  Discharge Vitals:   Blood pressure 113/74, pulse 89, temperature 98.9 F (37.2 C), temperature source Oral, resp. rate 16, height  (1.803 m), weight 77.111 kg (170 lb). Body mass index is 23.72 kg/(m^2). Lab Results:   No results found for this or any previous visit (from the past 72 hour(s)).  Physical Findings:  AIMS: Facial and Oral Movements Muscles of Facial Expression: None, normal Lips and Perioral Area: None, normal Jaw: None, normal Tongue: None, normal,Extremity Movements Upper (arms, wrists, hands, fingers): None, normal Lower (legs, knees, ankles, toes): None, normal, Trunk Movements Neck, shoulders, hips: None, normal, Overall Severity Severity of abnormal movements (highest score from questions above): None, normal Incapacitation due to abnormal movements: None, normal Patient's awareness of abnormal movements (rate only patient's report): No Awareness, Dental Status Current problems with teeth and/or dentures?: No Does patient usually wear dentures?: No  CIWA:  CIWA-Ar Total: 0 COWS:  COWS Total Score:  0   See Psychiatric Specialty Exam and Suicide Risk Assessment completed by Attending Physician prior to discharge.  Discharge destination:  Home  Is patient on multiple antipsychotic therapies at discharge:  No   Has Patient had three or more failed trials of antipsychotic monotherapy by history:  No    Recommended Plan for Multiple Antipsychotic Therapies: NA     Medication List    STOP taking these medications        amoxicillin-clavulanate 875-125 MG tablet  Commonly known as:  AUGMENTIN     folic acid 1 MG tablet  Commonly known as:  FOLVITE     ibuprofen 200 MG tablet  Commonly known as:  ADVIL,MOTRIN     LORazepam 1 MG tablet  Commonly known as:  ATIVAN     senna-docusate 8.6-50 MG tablet  Commonly known as:  Senokot-S     thiamine 100 MG tablet      TAKE these medications      Indication   buPROPion 300 MG 24 hr tablet  Commonly known as:  WELLBUTRIN XL  Take 1 tablet (300 mg total) by mouth daily.   Indication:  Major Depressive Disorder     hydrOXYzine 50 MG tablet  Commonly known as:  ATARAX/VISTARIL  Take 1 tablet (50 mg total) by mouth every 6 (six) hours as needed for anxiety (racing thoughts / insomnia).   Indication:  Anxiety Neurosis     lamoTRIgine 25 MG tablet  Commonly known as:  LAMICTAL  Take 3 tablets (75 mg total) by mouth 2 (two) times daily.   Indication:  mood stabilization     mirtazapine 7.5 MG tablet  Commonly known as:  REMERON  Take 1 tablet (7.5 mg total) by mouth at bedtime.   Indication:  Trouble Sleeping     prazosin 1 MG capsule  Commonly known as:  MINIPRESS  Take 3 capsules (3 mg total) by mouth at bedtime.   Indication:  PTSD     propranolol ER 60 MG 24 hr capsule  Commonly known as:  INDERAL LA  Take 1 capsule (60 mg total) by mouth daily.   Indication:  PTSD           Follow-up Information    Follow up with [facility] On 09/02/2015.   Why:  Arrive at airport for pick-up at 11:00AM.  Admission to      Contact information:   [address] [place], [place] [address] Phone: [phone] Fax: [phone]      Follow up with [facility].   Why:  referral faxed on 08/12/15   Contact information:   [address] [place], [place] [address] Phone: [phone] ext [phone] Fax: [phone]      Follow-up recommendations:  Activity:  As tolerated Diet:  Heart healthy with low sodium.  Comments:   Take all medications as prescribed. Keep all follow-up appointments as  scheduled.  Do not consume alcohol or use illegal drugs while on prescription medications. Report any adverse effects from your medications to your primary care provider promptly.  In the event of recurrent symptoms or worsening symptoms, call 911, a crisis hotline, or go to the nearest emergency department for evaluation.   Total Discharge Time: Greater than 30 minutes  Signed: Beau Fanny, FNP-BC 08/29/2015, 10:06 AM  I personally assessed the patient and formulated the plan Madie [place] A. Dub Mikes, M.D.

## 2015-08-29 NOTE — Tx Team (Signed)
Interdisciplinary Treatment Plan Update (Adult)  Date:  08/29/2015  Time Reviewed:  10:26 AM   Progress in Treatment: Attending groups: Yes  Participating in groups:  Yes  Taking medication as prescribed:  Yes. Tolerating medication:  Yes. Family/Significant othe contact made:  SPE completed with both pt and his mother.  Patient understands diagnosis:  Yes. and As evidenced by:  seeking treatment for SI with overdose of cocaine, alcohol abuse, depression, and medication stabilization. Discussing patient identified problems/goals with staff:  Yes. Medical problems stabilized or resolved:  Yes. Denies suicidal/homicidal ideation: Yes. Self report.  Issues/concerns per patient self-inventory:  Other:  Discharge Plan or Barriers: Pt accepted to [facility] for 11/4. Flight has been scheduled. Pt wants to d/c on Mon to "get things in order."   Reason for Continuation of Hospitalization: None  Comments:  Curtis Blankenship is a 34 y.o. male with history of alcohol and cocaine abuse presented to the ER because of suicidal ideation. Patient states he has been drinking alcohol everyday and last evening had taken extra dose of cocaine with intention of suicide. Patient states that he passed out following the last dose of cocaine. When he woke up he started developing chest pressure which lasted for 1 hour and resolved. Patient has been having some left-sided blurred vision since then. Patient hired a cab and came to the ER. Prior to which patient also had one episode of nausea vomiting. In the ER patient's labs revealed positive cocaine with mild hyponatremia and chest x-ray showing possible infiltrate. Patient was placed on suicide precaution and admitted for further management.  Denies any shortness of breath and chest pain at this time is resolved. Still complains of some left-sided blurred vision.  Estimated length of stay:  D/c today    Additional Comments:  Patient and CSW  reviewed pt's identified goals and treatment plan. Patient verbalized understanding and agreed to treatment plan. CSW reviewed [facility] "Discharge Process and Patient Involvement" Form. Pt verbalized understanding of information provided and signed form.    Review of initial/current patient goals per problem list:  1. Goal(s): Patient will participate in aftercare plan  Met: yes  Target date: at discharge  As evidenced by: Patient will participate within aftercare plan AEB aftercare provider and housing plan at discharge being identified.  10/6: CSW assessing for appropriate referrals.   10/11: Pt requesting referral to [facility] program CSW assessing.   10/14: [facility]. Review is on Tuesday for [place] program.   10/21: Denied for inpatient programs through [facility]. Denied psych bed that became open. Pending referral through [facility]-pt not wanting to accept at this point. CSW continuing to assess.   10/28: [facility].   2. Goal (s): Patient will exhibit decreased depressive symptoms and suicidal ideations.  Met: Yes    Target date: at discharge  As evidenced by: Patient will utilize self rating of depression at 3 or below and demonstrate decreased signs of depression or be deemed stable for discharge by MD.  10/6: Pt rates depression as high to day. Denies SI/HI/AVH.   10/11: Pt continues to rate depression as high today with no SI.   10/14: Pt rates depression as high today and presents with irritable mood.   10/21: Pt rates depression as 2/10 but now rates anxiety as 7/10 with some mood instability. Pt appears to be nearing his baseline.    3. Goal(s): Patient will demonstrate decreased  signs of withdrawal due to substance abuse  Met:Yes   Target date:at discharge   As evidenced by: Patient will produce a CIWA/COWS score of 0, have stable vitals signs, and no symptoms of  withdrawal.    10/6: Pt reports moderate withdrawals with CIWA score of 11 and high sitting BP.    10/11: Pt reports no signs of withdrawal with CIWA/COWS of 0 and stable vitals.   Attendees: Patient:   08/29/2015 10:26 AM   Family:   08/29/2015 10:26 AM   Physician:  Dr. Carlton Adam, MD 08/29/2015 10:26 AM   Nursing: Neta Mends RN 08/29/2015 10:26 AM   Clinical Social Worker: Maxie Better, LCSWA  08/29/2015 10:26 AM   Clinical Social Worker: Erasmo Downer Drinkard LCSWA  08/29/2015 10:26 AM   Other:  Gerline Legacy Nurse Case Manager 08/29/2015 10:26 AM   Other:  Lucinda Dell; [facility]  08/29/2015 10:26 AM   Other:   08/29/2015 10:26 AM   Other:  08/29/2015 10:26 AM   Other:  08/29/2015 10:26 AM   Other:  08/29/2015 10:26 AM    08/29/2015 10:26 AM    08/29/2015 10:26 AM    08/29/2015 10:26 AM    08/29/2015 10:26 AM    Scribe for Treatment Team:   Maxie Better, [place]  08/29/2015 10:26 AM

## 2015-08-29 NOTE — Progress Notes (Signed)
D: Patient seen awake in his room. Appears anxious and sad. Poor eye contact and minimal conversation. Patient denies pain, SI, AH/VH at this time. Patient initially refused medication stated "I'm not getting it now; will come get it when I want. For now, I say NO". Patient later came and took his medications. No further complains. A: Due/prn medications given as ordered. Offered support as needed. Every 15 minutes check for safety maintained. Will continue to monitor patient fr safety and stability.  R: Patient remains safe.

## 2015-08-29 NOTE — [facility] Suicide Risk Assessment (Signed)
[facility] Discharge Suicide Risk Assessment   Demographic Factors:  Male and Caucasian  Total Time spent with patient: 30 minutes  Musculoskeletal: Strength & Muscle Tone: within normal limits Gait & Station: normal Patient leans: normal  Psychiatric Specialty Exam: Physical Exam  Review of Systems  Constitutional: Negative.   HENT: Negative.   Eyes: Negative.   Respiratory: Negative.   Cardiovascular: Negative.   Gastrointestinal: Negative.   Genitourinary: Negative.   Musculoskeletal: Negative.   Skin: Negative.   Neurological: Negative.   Endo/Heme/Allergies: Negative.   Psychiatric/Behavioral: Positive for substance abuse.    Blood pressure 113/74, pulse 89, temperature 98.9 F (37.2 C), temperature source Oral, resp. rate 16, height  (1.803 m), weight 77.111 kg (170 lb).Body mass index is 23.72 kg/(m^2).  General Appearance: Fairly Groomed  Patent attorney::  Fair  Speech:  Clear and Coherent409  Volume:  Normal  Mood:  Euthymic  Affect:  Appropriate  Thought Process:  Coherent and Goal Directed  Orientation:  Full (Time, Place, and Person)  Thought Content:  plans as he moves on, relapse prevention plan  Suicidal Thoughts:  No  Homicidal Thoughts:  No  Memory:  Immediate;   Fair Recent;   Fair Remote;   Fair  Judgement:  Fair  Insight:  Present  Psychomotor Activity:  Normal  Concentration:  Fair  Recall:  [employer] of Knowledge:Fair  Language: Fair  Akathisia:  No  Handed:  Right  AIMS (if indicated):     Assets:  Desire for Improvement  Sleep:  Number of Hours: 5.75  Cognition: WNL  ADL's:  Intact   Have you used any form of tobacco in the last 30 days? (Cigarettes, Smokeless Tobacco, Cigars, and/or Pipes): Yes  Has this patient used any form of tobacco in the last 30 days? (Cigarettes, Smokeless Tobacco, Cigars, and/or Pipes) Yes, A prescription for an FDA-approved tobacco cessation medication was offered at discharge and the patient  refused  Mental Status Per Nursing Assessment::   On Admission:     Current Mental Status by Physician: In full contact with reality. There are no active SI plans or intent. There are no active S/S of withdrawal. He is willing and motivated to pursue further residential treatment for his substance abuse and later for his PTSD   Loss Factors: NA  Historical Factors: NA  Risk Reduction Factors:   wanting to do better  Continued Clinical Symptoms:  Depression:   Comorbid alcohol abuse/dependence Alcohol/Substance Abuse/Dependencies  Cognitive Features That Contribute To Risk:  Closed-mindedness, Polarized thinking and Thought constriction (tunnel vision)    Suicide Risk:  Minimal: No identifiable suicidal ideation.  Patients presenting with no risk factors but with morbid ruminations; may be classified as minimal risk based on the severity of the depressive symptoms  Principal Problem: Severe recurrent major depression without psychotic features [facility]) Discharge Diagnoses:  Patient Active Problem List   Diagnosis Date Noted  . Alcohol dependence with uncomplicated withdrawal (HCC) [F10.230]   . Uncomplicated alcohol dependence (HCC) [F10.20]   . Severe recurrent major depression without psychotic features (HCC) [F33.2] 08/04/2015  . Hyponatremia [E87.1] 08/02/2015  . Alcohol abuse [F10.10] 08/02/2015  . Suicidal ideation [R45.851] 08/02/2015  . Aspiration pneumonia (HCC) [J69.0] 08/02/2015  . Syncope [R55] 08/02/2015  . Blurred vision [H53.8]   . TIA (transient ischemic attack) [G45.9] 09/19/2013  . Disturbance of skin sensation [R20.9] 09/19/2013  . 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 Alcohol dependence (HCC) [F10.20] 12/01/2012  . Cocaine abuse [F14.10] 12/01/2012  .  PTSD (post-traumatic stress disorder) [F43.10] 12/01/2012  . Depressive disorder [F32.9] 12/01/2012    Follow-up Information    Follow [facility] with [facility] On 09/02/2015.   Why:  [place]  [facility] [place]. Contact person: Shanda BumpsJessica in Admissions.    Contact information:   [address] [place], [place] [id number] Phone: [phone] Fax: [phone]      Plan Of Care/Follow-up recommendations:  Activity:  as tolerated Diet:  regular [facility] Is patient on multiple antipsychotic therapies at discharge:  No   Has Patient had three or more failed trials of antipsychotic monotherapy by history:  No  Recommended Plan for Multiple Antipsychotic Therapies: N/A     Layah Skousen A 08/29/2015, 12:34 PM

## 2015-08-29 NOTE — Progress Notes (Signed)
  [facility] Discharge [facility] :  Will you be returning to the same living situation after discharge:  Yes,  pt plans to stay with his father until he goes to [facility] [place] on Friday Nov 4th At discharge, do you have transportation home?: Yes,  bus pass in chart Do you have the ability to pay for your medications: Yes,  Medicare  Release of information consent forms completed and submitted to medical records by CSW.  Patient to Follow up at: Follow-up Information    Follow up with [facility] On 09/02/2015.   [facility]  [place] [facility] [place] at 11[place]00AM. Contact person: Shanda BumpsJessica in Admissions.    Contact information:   [address] [place], [place] [id number] Phone: [phone] Fax: [phone]      Patient denies SI/HI: Yes,  during group/self report.     Safety Planning and Suicide Prevention discussed: Yes,  SPE completed with pt and his mother. SPI pamphlet and mobile crisis information provided to pt   Have you used any form of tobacco in the last 30 days? (Cigarettes, Smokeless Tobacco, Cigars, and/or Pipes): Yes  Has patient been referred to the Quitline?: Patient refused referral  Smart, Lebron QuamHeather LCSWA  08/29/2015, 10:24 AM

## 2015-08-29 NOTE — Progress Notes (Signed)
NSG shift assessment. 7a-7p.   D: Affect blunted, mood depressed, eye contact poor. Brightens when Interacting with other patients, and jokes around with them.  There is a prescription in pt's chart for Ativan that was written on 10/5 by Dr. Gagan Lama. Dr. Dub MikesLugo said, Curtis Blankenship the presence of pt, that he does not need that prescription at discharge because he is not taking Ativan.  A: Observed pt interacting in group and in the milieu: Support and encouragement offered. Safety maintained with observations every 15 minutes.   R:   Contracts for safety and continues to follow the treatment plan, working on learning new coping skills.   D: Pt discharged. Denies SI/HI, is not delusional, not psychotic. A: Escorted pt to lobby. R: Pt is ready to go and said that he is not going to smoke anymore. He asked this Clinical research associatewriter to throw out is one remaining cigarette and lighter.

## 2015-08-29 NOTE — Progress Notes (Addendum)
Pt attended spiritual care group on grief and loss facilitated by chaplain Burnis KingfisherMatthew Wilmetta Speiser and counseling intern [facility]ryn Beam. Group opened with brief discussion and psycho-social ed around grief and loss in relationships and in relation to self - identifying life patterns, circumstances, changes that cause losses. Established group norm of speaking from own life experience. Group goal of establishing open and affirming space for members to share loss and experience with grief, normalize grief experience and provide psycho social education and grief support.  Windy Fastonald Curtis Pilgrim([name]) was present throughout group. Oriented and attentive though with flat affect and silent through majority of group.  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  Described "the guy who came back is not the same guy who left.  That guy died over there."  Described finding support with other combat veterans, but stated that they often "both know something is wrong but won't say it."   Described his conversations with other veterans as "what if " and "remember when."   Facilitators identified feeling of isolation around others not knowing his experience and other members of group shared empathy for feeling of isolation in grief.  Curtis Blankenship described feeling empathy for soldiers returning from combat, as he "knows what they are about to step into."  He also described fear around "how long I will have to carry this."   Belva CromeStalnaker, Nandi Tonnesen Wayne MDiv

## 2016-02-10 ENCOUNTER — Encounter (HOSPITAL_COMMUNITY): Payer: Self-pay | Admitting: *Deleted

## 2016-02-10 ENCOUNTER — Emergency Department (HOSPITAL_COMMUNITY)
Admission: EM | Admit: 2016-02-10 | Discharge: 2016-02-11 | Payer: Medicare Other | Attending: Emergency Medicine | Admitting: Emergency Medicine

## 2016-02-10 DIAGNOSIS — F17210 Nicotine dependence, cigarettes, uncomplicated: Secondary | ICD-10-CM | POA: Diagnosis not present

## 2016-02-10 DIAGNOSIS — R45851 Suicidal ideations: Secondary | ICD-10-CM

## 2016-02-10 DIAGNOSIS — Z79899 Other long term (current) drug therapy: Secondary | ICD-10-CM | POA: Insufficient documentation

## 2016-02-10 DIAGNOSIS — F4310 Post-traumatic stress disorder, unspecified: Secondary | ICD-10-CM | POA: Insufficient documentation

## 2016-02-10 DIAGNOSIS — F1410 Cocaine abuse, uncomplicated: Secondary | ICD-10-CM | POA: Insufficient documentation

## 2016-02-10 DIAGNOSIS — F3130 Bipolar disorder, current episode depressed, mild or moderate severity, unspecified: Secondary | ICD-10-CM | POA: Insufficient documentation

## 2016-02-10 HISTORY — DX: Other psychoactive substance abuse, uncomplicated: F19.10

## 2016-02-10 LAB — COMPREHENSIVE METABOLIC PANEL
ALT: 21 U/L (ref 17–63)
AST: 20 U/L (ref 15–41)
Albumin: 4.5 g/dL (ref 3.5–5.0)
Alkaline Phosphatase: 113 U/L (ref 38–126)
Anion gap: 11 (ref 5–15)
BUN: 13 mg/dL (ref 6–20)
CHLORIDE: 105 mmol/L (ref 101–111)
CO2: 26 mmol/L (ref 22–32)
Calcium: 9.4 mg/dL (ref 8.9–10.3)
Creatinine, Ser: 1.09 mg/dL (ref 0.61–1.24)
Glucose, Bld: 93 mg/dL (ref 65–99)
POTASSIUM: 4.4 mmol/L (ref 3.5–5.1)
SODIUM: 142 mmol/L (ref 135–145)
Total Bilirubin: 0.5 mg/dL (ref 0.3–1.2)
Total Protein: 7.7 g/dL (ref 6.5–8.1)

## 2016-02-10 LAB — CBC
HCT: 41.4 % (ref 39.0–52.0)
Hemoglobin: 14.3 g/dL (ref 13.0–17.0)
MCH: 30.4 pg (ref 26.0–34.0)
MCHC: 34.5 g/dL (ref 30.0–36.0)
MCV: 88.1 fL (ref 78.0–100.0)
PLATELETS: 394 10*3/uL (ref 150–400)
RBC: 4.7 MIL/uL (ref 4.22–5.81)
RDW: 13.3 % (ref 11.5–15.5)
WBC: 7.8 10*3/uL (ref 4.0–10.5)

## 2016-02-10 LAB — RAPID URINE DRUG SCREEN, HOSP PERFORMED
AMPHETAMINES: NOT DETECTED
BENZODIAZEPINES: NOT DETECTED
Barbiturates: NOT DETECTED
Cocaine: POSITIVE — AB
OPIATES: NOT DETECTED
Tetrahydrocannabinol: NOT DETECTED

## 2016-02-10 LAB — SALICYLATE LEVEL

## 2016-02-10 LAB — ACETAMINOPHEN LEVEL

## 2016-02-10 LAB — ETHANOL: Alcohol, Ethyl (B): <5 mg/dL (ref <5)

## 2016-02-10 MED ORDER — VITAMIN B-1 100 MG PO TABS
100.0000 mg | ORAL_TABLET | Freq: Every day | ORAL | Status: DC
  Administered 2016-02-10 – 2016-02-11 (×2): 100 mg via ORAL
  Filled 2016-02-10 (×2): qty 1

## 2016-02-10 MED ORDER — LORAZEPAM 1 MG PO TABS
0.0000 mg | ORAL_TABLET | Freq: Four times a day (QID) | ORAL | Status: DC
  Administered 2016-02-10 – 2016-02-11 (×2): 1 mg via ORAL
  Filled 2016-02-10 (×2): qty 1

## 2016-02-10 MED ORDER — DIPHENHYDRAMINE HCL 25 MG PO CAPS: 50.0000 mg | ORAL_CAPSULE | Freq: Four times a day (QID) | ORAL | Status: DC | PRN

## 2016-02-10 MED ORDER — MIRTAZAPINE 7.5 MG PO TABS
7.5000 mg | ORAL_TABLET | Freq: Every day | ORAL | Status: DC
  Administered 2016-02-10: 7.5 mg via ORAL
  Filled 2016-02-10 (×3): qty 1

## 2016-02-10 MED ORDER — THIAMINE HCL 100 MG/ML IJ SOLN: 100.0000 mg | Freq: Every day | INTRAMUSCULAR | Status: DC

## 2016-02-10 MED ORDER — PROPRANOLOL HCL ER 60 MG PO CP24
60.0000 mg | ORAL_CAPSULE | Freq: Every day | ORAL | Status: DC
  Administered 2016-02-10 – 2016-02-11 (×2): 60 mg via ORAL
  Filled 2016-02-10 (×2): qty 1

## 2016-02-10 MED ORDER — LORAZEPAM 1 MG PO TABS: 0.0000 mg | ORAL_TABLET | Freq: Two times a day (BID) | ORAL | Status: DC

## 2016-02-10 MED ORDER — HYDROXYZINE HCL 25 MG PO TABS: 50.0000 mg | ORAL_TABLET | Freq: Four times a day (QID) | ORAL | Status: DC | PRN

## 2016-02-10 MED ORDER — LAMOTRIGINE 25 MG PO TABS
75.0000 mg | ORAL_TABLET | Freq: Two times a day (BID) | ORAL | Status: DC
  Administered 2016-02-10 – 2016-02-11 (×2): 75 mg via ORAL
  Filled 2016-02-10 (×4): qty 3

## 2016-02-10 MED ORDER — PRAZOSIN HCL 2 MG PO CAPS
3.0000 mg | ORAL_CAPSULE | Freq: Every day | ORAL | Status: DC
  Administered 2016-02-10: 3 mg via ORAL
  Filled 2016-02-10 (×3): qty 1

## 2016-02-10 MED ORDER — BUPROPION HCL ER (XL) 300 MG PO TB24
300.0000 mg | ORAL_TABLET | Freq: Every day | ORAL | Status: DC
  Administered 2016-02-10 – 2016-02-11 (×2): 300 mg via ORAL
  Filled 2016-02-10 (×2): qty 1

## 2016-02-10 MED ORDER — DIPHENHYDRAMINE HCL 25 MG PO TABS
50.0000 mg | ORAL_TABLET | Freq: Four times a day (QID) | ORAL | Status: DC | PRN
  Filled 2016-02-10: qty 3

## 2016-02-10 NOTE — BH Assessment (Addendum)
Assessment Note  Curtis Blankenship is an 35 y.o. male presenting voluntarily to [facility] for detox and "having thoughts about suicide." When asked upon triage patient states that he had no support and had been wearing the same clothes for a week. Patient reported that he had a "couple of plans" for suicide" but  Would not elaborate to staff regarding his plans. When asked about his SI during his assessment, patient states "I was kind of thinking about suicide before I came in, but not right now." When asked about a plan, patient states "I was kind of thinking about jumping off of a bridge but then I thought, if I survived that would suck." Patient denies HI and history of violence. Patient denies access to weapons and firearms. Patient denies pending charges and upcoming court dates. Patient denies AVH and does not appear to be responding to internal stimuli. Patient reports that he has been to the [facility] in the past for outpatient psychiatric needs but reports that he cannot recall the timeframe that he had services.  Patient reports that he does not recall the medications that he was prescribed and has not taking them in "over a month" reporting that the [facility] fills his prescriptions. Patient reports that he has had "a few" inpatient admissions with the last one being October 2016 at [facility].   Patient reports that he uses a gram of cocaine per day and last used .5 grams of cocaine last night. Patient reports that he has used cocaine since age 54. Patient states that he drinks "30 beers easily" per day and has been drinking since age 40. Patient reports that he last drank today before he came in to the ED. Upon assessment BAL <5 and UDS + cocaine.  Consulted with Dr. Jannifer Franklin who recommends inpatient treatment at this time.   Diagnosis: Major Depressive Disorder, Recurrent, Severe         Alcohol Use Disorder per patient history  Past Medical History:  Past Medical History  Diagnosis Date  . Bipolar disorder  (HCC)   . PTSD (post-traumatic stress disorder)   . Depression   . Polysubstance abuse     cocaine, etoh    Past Surgical History  Procedure Laterality Date  . Appendectomy      Family History:  Family History  Problem Relation Age of Onset  . Hypertension Mother   . Alcoholism Other     Social History:  reports that he has been smoking Cigarettes.  He has a 15 pack-year smoking history. He has quit using smokeless tobacco. His smokeless tobacco use included Snuff. He reports that he drinks about 97.2 oz of alcohol per week. He reports that he uses illicit drugs (Cocaine, Other-see comments, and Benzodiazepines).  Additional Social History:  Alcohol / Drug Use Pain Medications: See PTA Prescriptions: See PTA Over the Counter: See PTA History of alcohol / drug use?: Yes Longest period of sobriety (when/how long): 6 months Substance #1 Name of Substance 1: Cocaine 1 - Age of First Use: 18 1 - Amount (size/oz): "maybe a gram" 1 - Frequency: daily 1 - Duration: ongoing 1 - Last Use / Amount: last night, .5 gram Substance #2 Name of Substance 2: Alcohol 2 - Age of First Use: 18 2 - Amount (size/oz): "30 beers easy" 2 - Frequency: daily 2 - Duration: ongoing 2 - Last Use / Amount: "right before I came here"  CIWA: CIWA-Ar BP: 123/89 mmHg Pulse Rate: 102 Nausea and Vomiting: mild nausea with no  vomiting Tactile Disturbances: none Tremor: no tremor Auditory Disturbances: not present Paroxysmal Sweats: no sweat visible Visual Disturbances: not present Anxiety: no anxiety, at ease Headache, Fullness in Head: none present Agitation: normal activity Orientation and Clouding of Sensorium: oriented and can do serial additions CIWA-Ar Total: 1 COWS:    Allergies: No Known Allergies  Home Medications:  (Not in a hospital admission)  OB/GYN Status:  No LMP for male patient.  General Assessment Data Location of Assessment: [facility] TTS Assessment: In system Is this a  Tele or Face-to-Face Assessment?: Face-to-Face Is this an Initial Assessment or a Re-assessment for this encounter?: Initial Assessment Marital status: Single Is patient pregnant?: No Pregnancy Status: No Living Arrangements: Parent (mother) Can pt return to current living arrangement?: Yes Admission Status: Voluntary Is patient capable of signing voluntary admission?: Yes Referral Source: Self/Family/Friend Insurance type: Medicare, [facility]     Crisis Care Plan Living Arrangements: Parent (mother) Name of Psychiatrist: None Name of Therapist: None  Education Status Is patient currently in school?: No Highest grade of school patient has completed: 12  Risk to self with the past 6 months Suicidal Ideation: No Has patient been a risk to self within the past 6 months prior to admission? : Other (comment) (reports intermittent SI with thoughts of "something quick") Suicidal Intent: No Has patient had any suicidal intent within the past 6 months prior to admission? : Other (comment) (decided his plan would "suck") Is patient at risk for suicide?: No Suicidal Plan?: No Has patient had any suicidal plan within the past 6 months prior to admission? : Other (comment) (he "thought about jumping off of a bridge.") Access to Means: No What has been your use of drugs/alcohol within the last 12 months?: Alcohol and cocaine daily Previous Attempts/Gestures: No How many times?: 0 Other Self Harm Risks: Denies Triggers for Past Attempts: None known Intentional Self Injurious Behavior: None Family Suicide History: No Recent stressful life event(s): Other (Comment) ("everything stresses me") Persecutory voices/beliefs?: No Depression: Yes Depression Symptoms: Despondent, Insomnia, Isolating, Fatigue, Loss of interest in usual pleasures, Feeling worthless/self pity Substance abuse history and/or treatment for substance abuse?: Yes Suicide prevention information given to non-admitted patients: Not  applicable  Risk to Others within the past 6 months Homicidal Ideation: No Does patient have any lifetime risk of violence toward others beyond the six months prior to admission? : No Thoughts of Harm to Others: No Current Homicidal Intent: No Current Homicidal Plan: No Access to Homicidal Means: No Identified Victim: Denies History of harm to others?: No Assessment of Violence: None Noted Violent Behavior Description: Denies Does patient have access to weapons?: No Criminal Charges Pending?: No Does patient have a court date: No Is patient on probation?: No  Psychosis Hallucinations: None noted Delusions: None noted  Mental Status Report Appearance/Hygiene: Disheveled, In scrubs Eye Contact: Fair Motor Activity: Unremarkable Speech: Logical/coherent Level of Consciousness: Alert Mood: Pleasant Affect: Appropriate to circumstance Anxiety Level: None Thought Processes: Coherent, Relevant Judgement: Unimpaired Orientation: Person, Place, Time, Situation, Appropriate for developmental age Obsessive Compulsive Thoughts/Behaviors: None  Cognitive Functioning Concentration: Poor Memory: Recent Intact, Remote Intact IQ: Average Insight: Poor Impulse Control: Poor Appetite: Good Sleep: Decreased Total Hours of Sleep:  ("a couple") Vegetative Symptoms: None  ADLScreening [facility] Assessment Services) Patient's cognitive ability adequate to safely complete daily activities?: Yes Patient able to express need for assistance with ADLs?: Yes Independently performs ADLs?: Yes (appropriate for developmental age)  Prior Inpatient Therapy Prior Inpatient Therapy: Yes Prior Therapy Dates:  07/2015 Prior Therapy Facilty/Provider(s): [facility] Reason for Treatment: SI  Prior Outpatient Therapy Prior Outpatient Therapy: Yes Prior Therapy Dates: [facility] Reason for Treatment: PTSD Does patient have an ACCT team?: No Does patient have Intensive  In-House Services?  : No Does patient have [facility] services? : No Does patient have P4CC services?: No  ADL Screening (condition at time of admission) Patient's cognitive ability adequate to safely complete daily activities?: Yes Is the patient deaf or have difficulty hearing?: No Does the patient have difficulty seeing, even when wearing glasses/contacts?: No Does the patient have difficulty concentrating, remembering, or making decisions?: No Patient able to express need for assistance with ADLs?: Yes Does the patient have difficulty dressing or bathing?: No Independently performs ADLs?: Yes (appropriate for developmental age) Does the patient have difficulty walking or climbing stairs?: No Weakness of Legs: None Weakness of Arms/Hands: None  Home Assistive Devices/Equipment Home Assistive Devices/Equipment: None  Therapy Consults (therapy consults require a physician order) PT Evaluation Needed: No OT Evalulation Needed: No SLP Evaluation Needed: No Abuse/Neglect Assessment (Assessment to be complete while patient is alone) Physical Abuse: Denies Verbal Abuse: Denies Sexual Abuse: Denies Exploitation of patient/patient's resources: Denies Self-Neglect: Denies Values / Beliefs Cultural Requests During Hospitalization: None Spiritual Requests During Hospitalization: None Consults Spiritual Care Consult Needed: No Social Work Consult Needed: No Merchant navy officer (For Healthcare) Does patient have an advance directive?: No Would patient like information on creating an advanced directive?: No - patient declined information    Additional Information 1:1 In Past 12 Months?: No CIRT Risk: No Elopement Risk: No Does patient have medical clearance?: Yes     Disposition:  Disposition Initial Assessment Completed for this Encounter: Yes Disposition of Patient: Inpatient treatment program (per Dr. Jannifer Franklin) Type of inpatient treatment program: Adult  On Site Evaluation  by:   Reviewed with Physician:    Ahriana Gunkel 02/10/2016 12:21 PM

## 2016-02-10 NOTE — ED Notes (Signed)
Patient noted sleeping in room. No complaints, stable, in no acute distress. Q15 minute rounds and monitoring via Security Cameras to continue.  

## 2016-02-10 NOTE — BHH Counselor (Signed)
This writer submitted supporting documentation to the following psychiatric facilities for patient placement: [facility], [facility], [facility], [facility]  Alekzander Cardell McNeil, MA 

## 2016-02-10 NOTE — ED Notes (Signed)
One bag pt belongings placed in locker 26 containing clothing and passport. Inventory sheet in pt chart.

## 2016-02-10 NOTE — ED Notes (Signed)
Pt states that he has been having trouble with ETOH and having thoughts about suicide; pt states that he has no family support and has been in the same clothes for a week; pt states that [facility] and has an appointment in May but doesn't feel like he can wait that long; pt states that he is embarrassed because of his situation but doesn't feel like he can control it; pt states that he has a couple of plans for suicide when asked but will not advise as to what the plan is

## 2016-02-10 NOTE — ED Notes (Signed)
TTS at bedside. 

## 2016-02-10 NOTE — ED Provider Notes (Addendum)
CSN: [id number]     Arrival date & time 02/10/16  6962 History   First MD Initiated Contact with Patient 02/10/16 [phone]     Chief Complaint  Patient presents with  . Suicidal      HPI Pt was seen at 0725. Per pt, c/o gradual onset and worsening of persistent depression and SI for the past 2 weeks. Pt states he has run out of his psych meds and "been drinking a lot." Endorses SI with "several plans" but will not further explain. The symptoms have been associated with no other complaints. The patient has a significant history of similar symptoms previously, recently being evaluated for this complaint and multiple prior evals for same.      Past Medical History  Diagnosis Date  . Bipolar disorder (HCC)   . PTSD (post-traumatic stress disorder)   . Depression   . Polysubstance abuse     cocaine, etoh   Past Surgical History  Procedure Laterality Date  . Appendectomy     Family History  Problem Relation Age of Onset  . Hypertension Mother   . Alcoholism Other    Social History  Substance Use Topics  . Smoking status: Current Every Day Smoker -- 1.50 packs/day for 10 years    Types: Cigarettes  . Smokeless tobacco: Former Neurosurgeon    Types: Snuff  . Alcohol Use: 97.2 oz/week    150 Cans of beer, 12 Shots of liquor per week     Comment: he drinks every day as much as he can    Review of Systems ROS: Statement: All systems negative except as marked or noted in the HPI; Constitutional: Negative for fever and chills. ; ; Eyes: Negative for eye pain, redness and discharge. ; ; ENMT: Negative for ear pain, hoarseness, nasal congestion, sinus pressure and sore throat. ; ; Cardiovascular: Negative for chest pain, palpitations, diaphoresis, dyspnea and peripheral edema. ; ; Respiratory: Negative for cough, wheezing and stridor. ; ; Gastrointestinal: Negative for nausea, vomiting, diarrhea, abdominal pain, blood in stool, hematemesis, jaundice and rectal bleeding. . ; ; Genitourinary: Negative  for dysuria, flank pain and hematuria. ; ; Musculoskeletal: Negative for back pain and neck pain. Negative for swelling and trauma.; ; Skin: Negative for pruritus, rash, abrasions, blisters, bruising and skin lesion.; ; Neuro: Negative for headache, lightheadedness and neck stiffness. Negative for weakness, altered level of consciousness , altered mental status, extremity weakness, paresthesias, involuntary movement, seizure and syncope.; Psych:  +SI, no SA, no HI, no hallucinations.     Allergies  Review of patient's allergies indicates no known allergies.  Home Medications   Prior to Admission medications   Medication Sig Start Date End Date Taking? Authorizing Provider  buPROPion (WELLBUTRIN XL) 300 MG 24 hr tablet Take 1 tablet (300 mg total) by mouth daily. 08/29/15   Beau Fanny, FNP  hydrOXYzine (ATARAX/VISTARIL) 50 MG tablet Take 1 tablet (50 mg total) by mouth every 6 (six) hours as needed for anxiety (racing thoughts / insomnia). 08/29/15   Beau Fanny, FNP  lamoTRIgine (LAMICTAL) 25 MG tablet Take 3 tablets (75 mg total) by mouth 2 (two) times daily. 08/29/15   Beau Fanny, FNP  mirtazapine (REMERON) 7.5 MG tablet Take 1 tablet (7.5 mg total) by mouth at bedtime. 08/29/15   Beau Fanny, FNP  prazosin (MINIPRESS) 1 MG capsule Take 3 capsules (3 mg total) by mouth at bedtime. 08/29/15   Beau Fanny, FNP  propranolol ER (INDERAL LA) 60 MG  24 hr capsule Take 1 capsule (60 mg total) by mouth daily. 08/29/15   Everardo All Withrow, FNP   BP 123/89 mmHg  Pulse 102  Temp(Src) 98.4 F (36.9 C) (Oral)  Resp 21  SpO2 96% Physical Exam  0730: Physical examination:  Nursing notes reviewed; Vital signs and O2 SAT reviewed;  Constitutional: Well developed, Well nourished, Well hydrated, In no acute distress; Head:  Normocephalic, atraumatic; Eyes: EOMI, PERRL, No scleral icterus; ENMT: Mouth and pharynx normal, Mucous membranes moist; Neck: Supple, Full range of motion; Cardiovascular:  Regular rate and rhythm; Respiratory: Breath sounds clear, No wheezes.  Speaking full sentences with ease, Normal respiratory effort/excursion; Chest: No deformity, Movement normal; Abdomen: Nondistended; Extremities: No deformity.; Neuro: AA&Ox3, Major CN grossly intact.  Speech clear. No gross focal motor deficits in extremities. Climbs on and off stretcher easily by himself. Gait steady.; Skin: Color normal, Warm, Dry.; Psych:  Affect flat. Endorses SI.    ED Course  Procedures (including critical care time) Labs Review  Imaging Review  I have personally reviewed and evaluated these images and lab results as part of my medical decision-making.   EKG Interpretation None      MDM  MDM Reviewed: nursing note, vitals and previous chart Reviewed previous: labs Interpretation: labs     Results for orders placed or performed during the hospital encounter of 02/10/16  Comprehensive metabolic panel  Result Value Ref Range   Sodium 142 135 - 145 mmol/L   Potassium 4.4 3.5 - 5.1 mmol/L   Chloride 105 101 - 111 mmol/L   CO2 26 22 - 32 mmol/L   Glucose, Bld 93 65 - 99 mg/dL   BUN 13 6 - 20 mg/dL   Creatinine, Ser 0.98 0.61 - 1.24 mg/dL   Calcium 9.4 8.9 - 11.9 mg/dL   Total Protein 7.7 6.5 - 8.1 g/dL   Albumin 4.5 3.5 - 5.0 g/dL   AST 20 15 - 41 U/L   ALT 21 17 - 63 U/L   Alkaline Phosphatase 113 38 - 126 U/L   Total Bilirubin 0.5 0.3 - 1.2 mg/dL   GFR calc non Af Amer >60 >60 mL/min   GFR calc Af Amer >60 >60 mL/min   Anion gap 11 5 - 15  Ethanol (ETOH)  Result Value Ref Range   Alcohol, Ethyl (B) <5 <5 mg/dL  Salicylate level  Result Value Ref Range   Salicylate Lvl <4.0 2.8 - 30.0 mg/dL  Acetaminophen level  Result Value Ref Range   Acetaminophen (Tylenol), Serum <10 (L) 10 - 30 ug/mL  CBC  Result Value Ref Range   WBC 7.8 4.0 - 10.5 K/uL   RBC 4.70 4.22 - 5.81 MIL/uL   Hemoglobin 14.3 13.0 - 17.0 g/dL   HCT 14.7 82.9 - 56.2 %   MCV 88.1 78.0 - 100.0 fL   MCH 30.4  26.0 - 34.0 pg   MCHC 34.5 30.0 - 36.0 g/dL   RDW 13.0 86.5 - 78.4 %   Platelets 394 150 - 400 K/uL  Urine rapid drug screen (hosp performed) (Not at [facility])  Result Value Ref Range   Opiates NONE DETECTED NONE DETECTED   Cocaine POSITIVE (A) NONE DETECTED   Benzodiazepines NONE DETECTED NONE DETECTED   Amphetamines NONE DETECTED NONE DETECTED   Tetrahydrocannabinol NONE DETECTED NONE DETECTED   Barbiturates NONE DETECTED NONE DETECTED    0825:  TTS eval pending. CIWA protocol ordered.   1445:  TTS has evaluated pt: inpt treatment recommended, placement  pending. Holding orders written.     Samuel JesterKathleen Jamaris Biernat, DO 02/10/16 [phone]

## 2016-02-10 NOTE — ED Notes (Signed)
Will remind pt of need for urine sample once pt wakes up

## 2016-02-10 NOTE — ED Notes (Signed)
Report from Edie Marvin RN. Patient sleeping, respirations regular and unlabored. Q15 minute rounds and security camera observation to continue.    

## 2016-02-10 NOTE — ED Notes (Signed)
[facility] Expected date:  Expected time:  Means of arrival:  Comments: [id number]

## 2016-02-10 NOTE — ED Notes (Signed)
Pt oriented to room and unit.  Pt contracts for safety.  Denies H/I and AVH.  15 minute checks and video monitoring continue.

## 2016-02-11 MED ORDER — ONDANSETRON HCL 4 MG PO TABS
4.0000 mg | ORAL_TABLET | Freq: Once | ORAL | Status: AC
  Administered 2016-02-11: 4 mg via ORAL
  Filled 2016-02-11: qty 1

## 2016-02-11 NOTE — ED Notes (Addendum)
Has been sleeping, reports nausea has resolved and he is feeling better.

## 2016-02-11 NOTE — ED Notes (Addendum)
[facility].  AVS, EMTELA, MAR report, assessment note, face sheet, transfer report,and belongings given to driver

## 2016-02-11 NOTE — Clinical Social Work Note (Addendum)
CSW [facility].  No beds for pt this weekend.  Elray Buba.Madissen Wyse, LCSW [facility] Clinical Social Worker - Weekend Coverage cell #: [phone]

## 2016-02-11 NOTE — ED Notes (Signed)
CSW into see 

## 2016-02-11 NOTE — ED Notes (Signed)
Patient noted sleeping in room. No complaints, stable, in no acute distress. Q15 minute rounds and monitoring via Security Cameras to continue.  

## 2016-02-11 NOTE — ED Notes (Signed)
[facility] here to transport 

## 2016-02-11 NOTE — ED Notes (Signed)
Snack/soda given

## 2016-02-11 NOTE — ED Notes (Signed)
[facility] contacted for transport 

## 2016-02-11 NOTE — ED Notes (Signed)
Pt sleeping, easily aroused.  Pt reports that he is here for suicidal thoughts and VH. Pt reports hx of PTSD.

## 2016-02-11 NOTE — Clinical Social Work Note (Signed)
[facility] by Dr. Jeannine KittenFarah.  They requested pt be sent after 10:30 and the number for report is [phone].  Marland Kitchen.Elray Bubaegina Nyimah Shadduck, LCSW [facility] Clinical Social Worker - Weekend Coverage cell #: [phone]

## 2016-03-31 IMAGING — CT CT HEAD W/O CM
1 of 2 series · 16 of 30 positions shown, 20 images · non-contrast
Comparison: 09/19/2013

CLINICAL DATA: Left-sided blurred vision. Suicidal ideation. Drug
abuse. Bipolar disorder.

EXAM:
CT HEAD WITHOUT CONTRAST
TECHNIQUE: Contiguous axial images were obtained from the base of the skull
through the vertex without intravenous contrast.

[Series 2: head_seq 4.5 h37s st · axial · 0.45mm/px · z∈[-130,+14]mm · 16 of 36 slices shown, 20 images]
[im 2/36  brain]
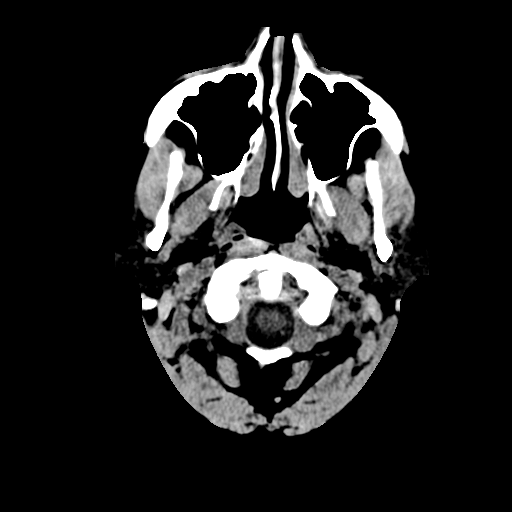
[im 2/36  bone]
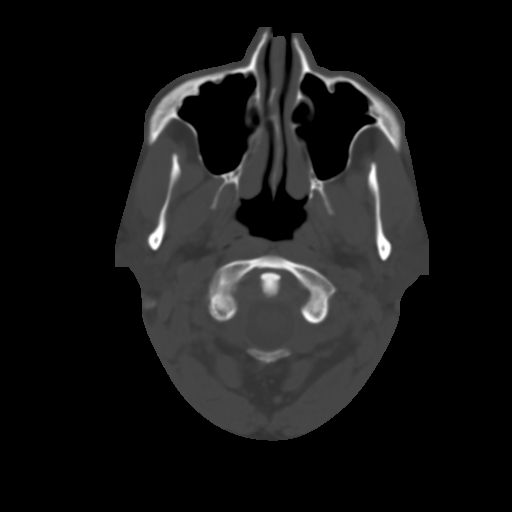
[im 4/36  brain]
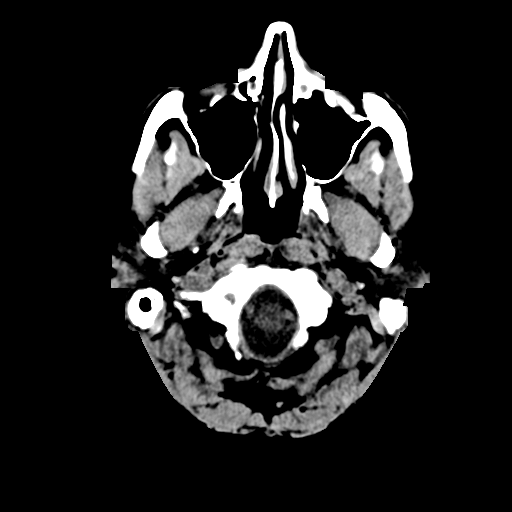
[im 6/36  brain]
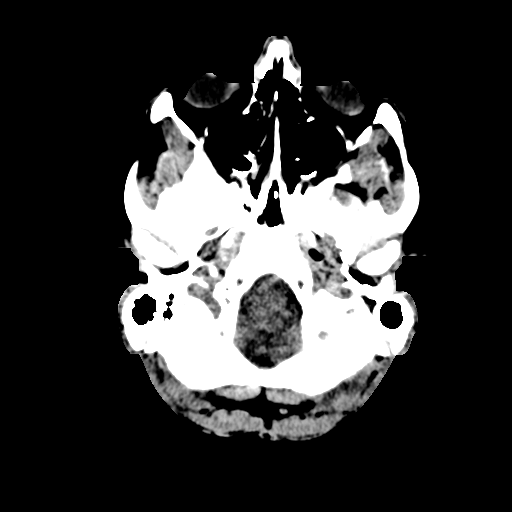
[im 9/36  brain]
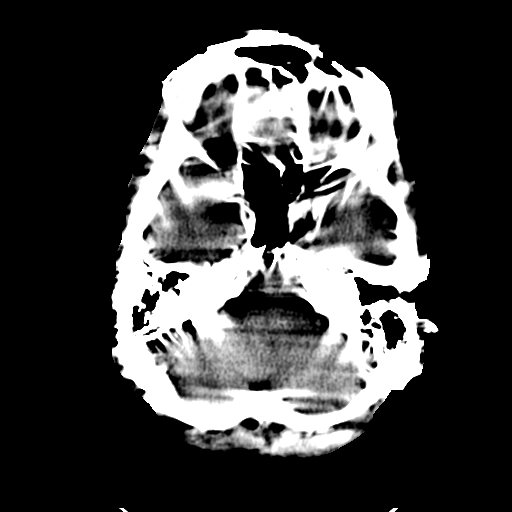
[im 11/36  brain]
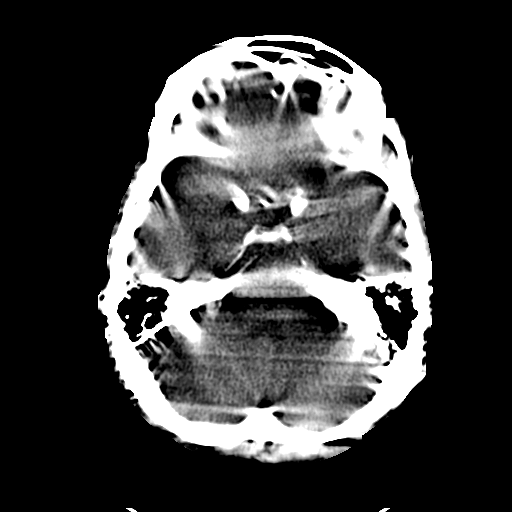
[im 11/36  bone]
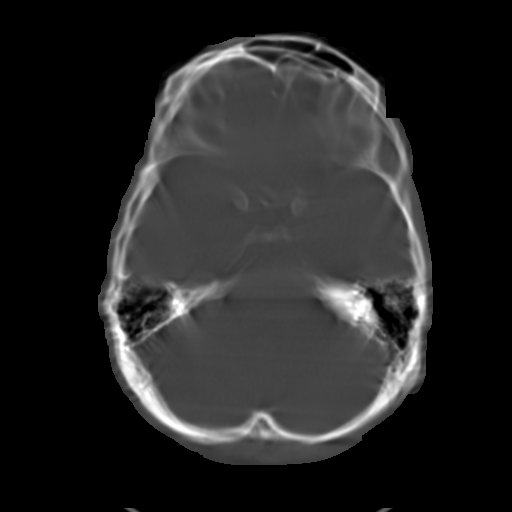
[im 13/36  brain]
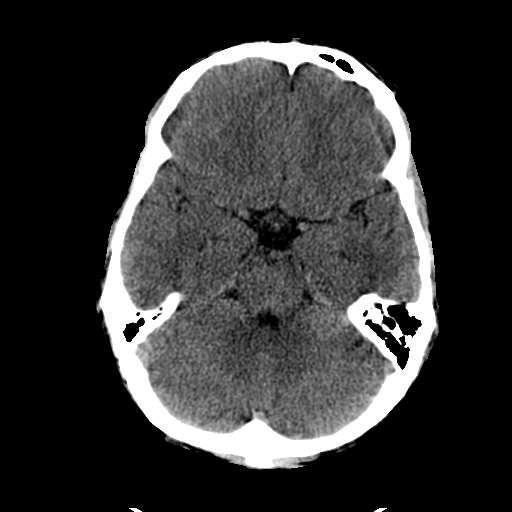
[im 15/36  brain]
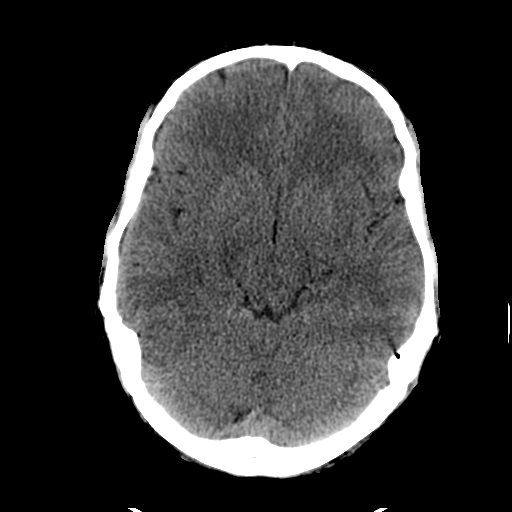
[im 16/36  brain]
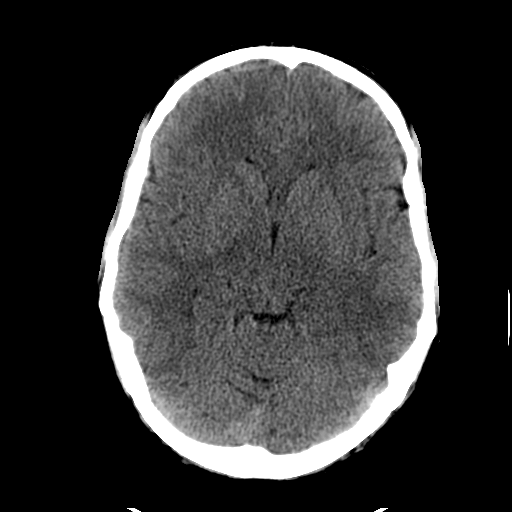
[im 20/36  brain]
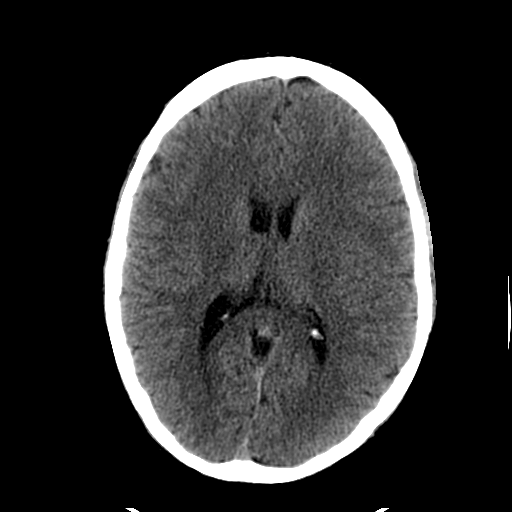
[im 20/36  bone]
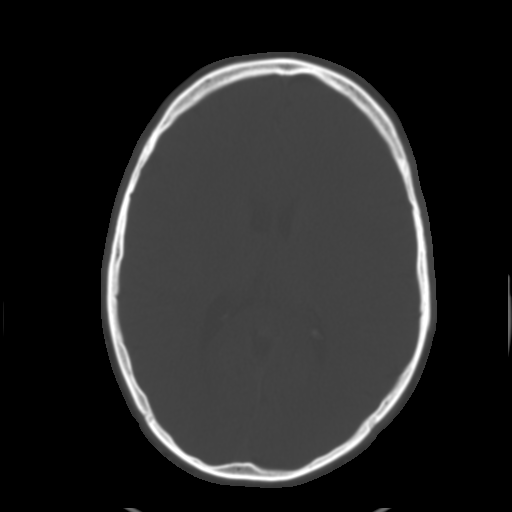
[im 22/36  brain]
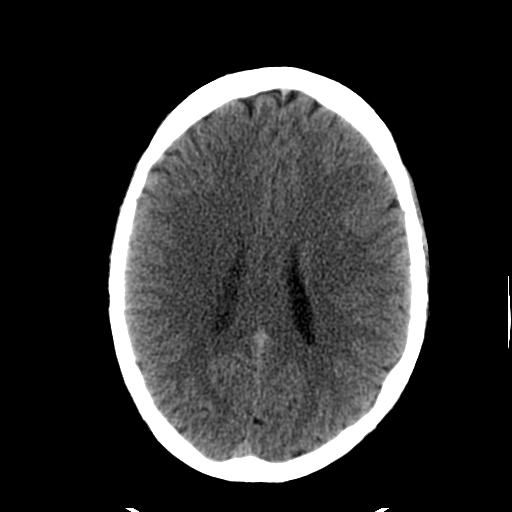
[im 23/36  brain]
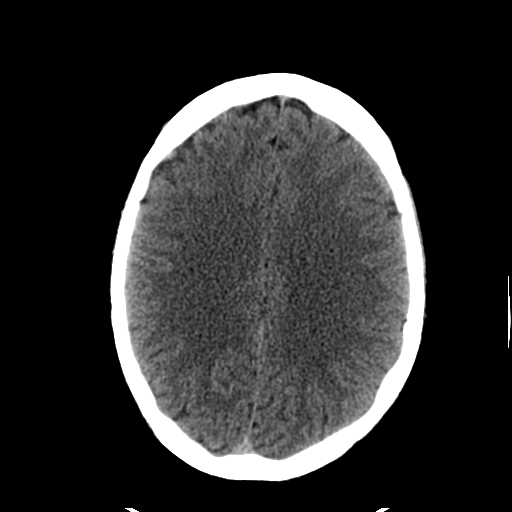
[im 25/36  brain]
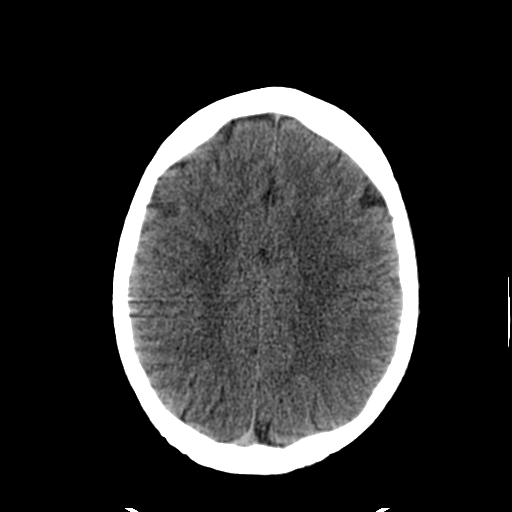
[im 27/36  brain]
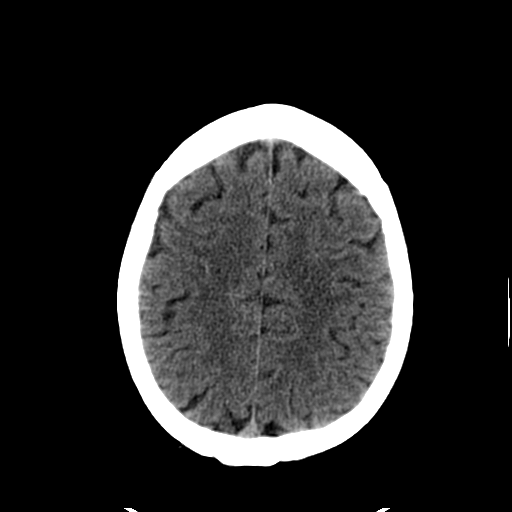
[im 27/36  bone]
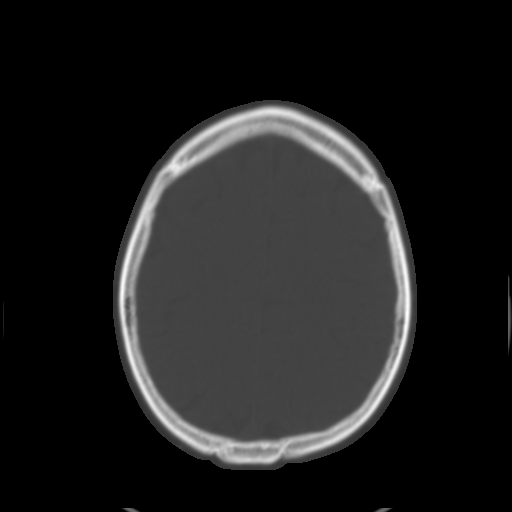
[im 30/36  brain]
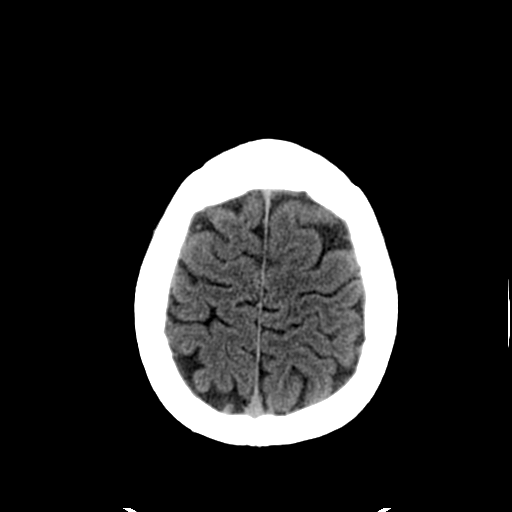
[im 32/36  brain]
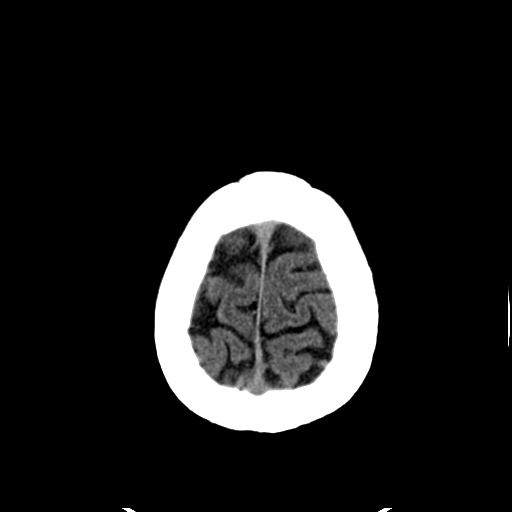
[im 34/36  brain]
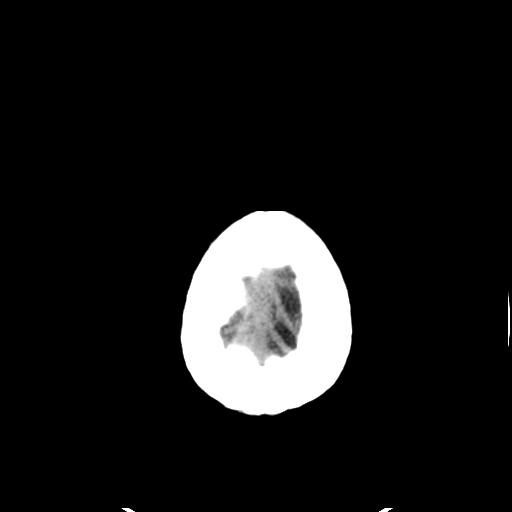

[16 of 30 positions shown; findings below may reference images not displayed]

FINDINGS: No evidence of intracranial hemorrhage, brain edema, or other signs
of acute infarction. No evidence of intracranial mass lesion or mass
effect.

No abnormal extraaxial fluid collections identified. Ventricles are
normal in size. No skull abnormality identified.
IMPRESSION: Negative noncontrast head CT.

## 2016-03-31 IMAGING — MR MR HEAD W/O CM
8 of 10 series · 38 of 48 positions shown · non-contrast
Comparison: Head CT without contrast 1551 hr today. Brain MRI
09/20/2013.

CLINICAL DATA: 33-year-old male with left side blurred vision
yesterday. Initial encounter.

EXAM:
MRI HEAD WITHOUT CONTRAST
TECHNIQUE: Multiplanar, multiecho pulse sequences of the brain and surrounding
structures were obtained without intravenous contrast.

[Series 3: T1 · sagittal · 5.0mm · 0.47mm/px · 1 of 24 slices shown]
[im 1/24]
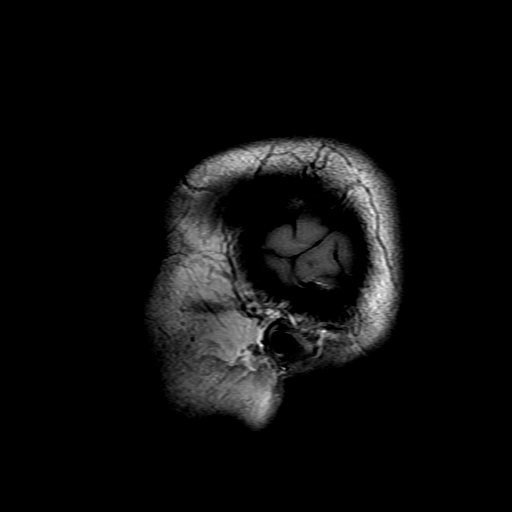

[Series 4: DWI · axial · 3.0mm · 1.09mm/px · z∈[-49,+96]mm · 11 of 102 slices shown (1 of 4)]
[im 1/102]
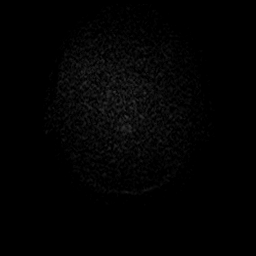
[im 11/102]
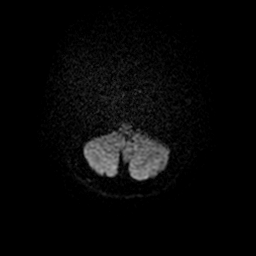
[im 21/102]
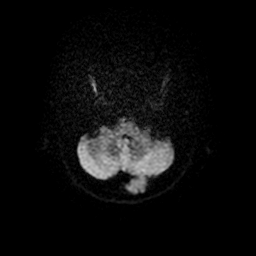
[im 31/102]
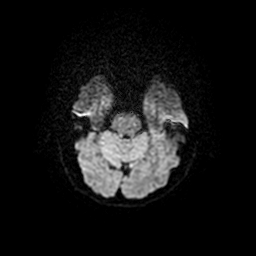
[im 41/102]
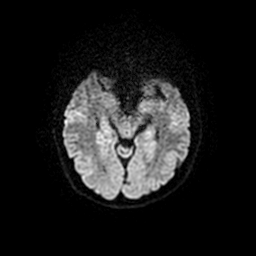
[im 51/102]
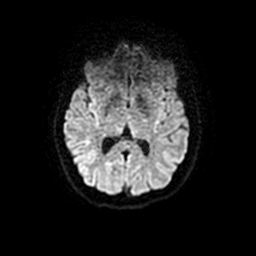
[im 61/102]
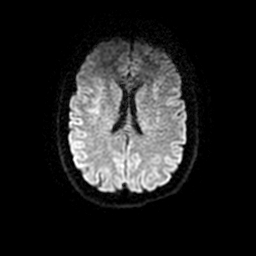
[im 71/102]
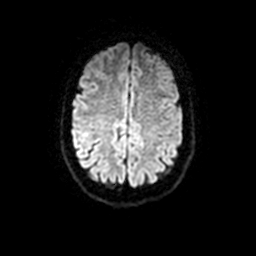
[im 81/102]
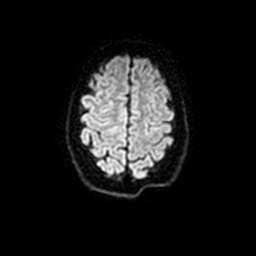
[im 91/102]
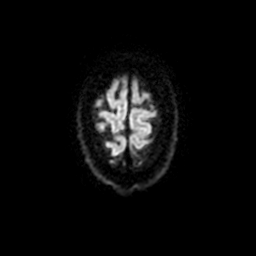
[im 102/102]
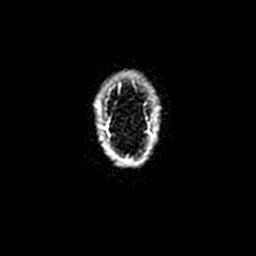

[Series 5: DWI · coronal · 5.0mm · 1.09mm/px · 8 of 78 slices shown (2 of 4)]
[im 1/78]
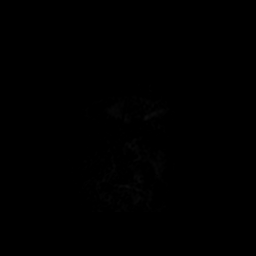
[im 12/78]
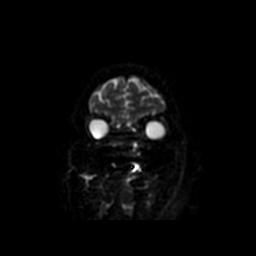
[im 23/78]
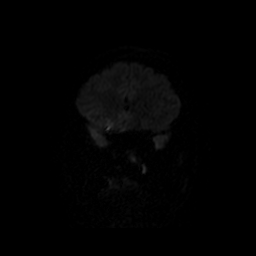
[im 34/78]
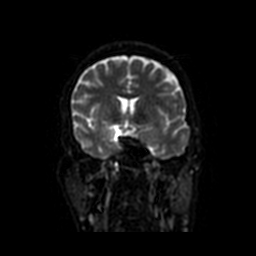
[im 45/78]
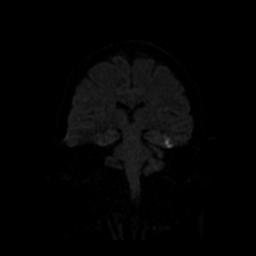
[im 56/78]
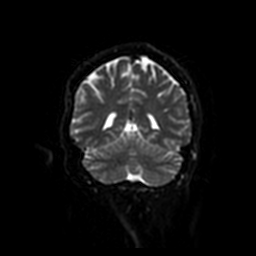
[im 67/78]
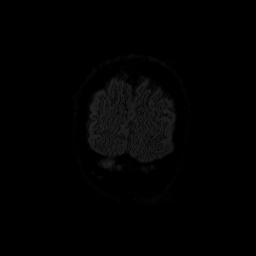
[im 78/78]
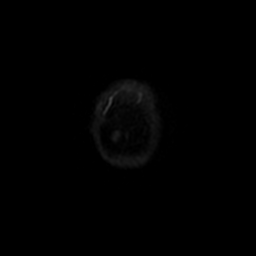

[Series 6: T2 · axial · 5.0mm · 0.43mm/px · z∈[-42,+104]mm · 3 of 24 slices shown (1 of 2)]
[im 1/24]
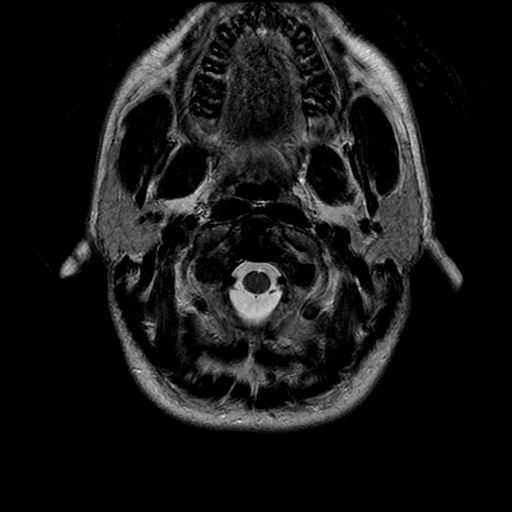
[im 12/24]
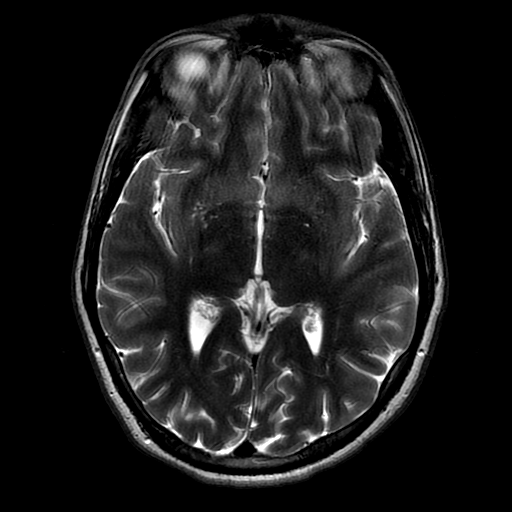
[im 24/24]
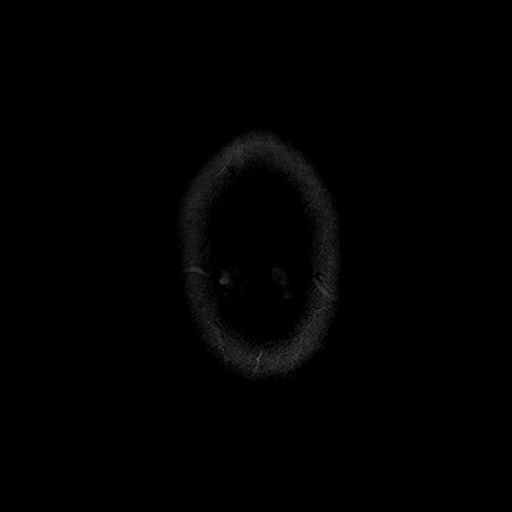

[Series 7: FLAIR · axial · 5.0mm · 0.43mm/px · z∈[-47,+109]mm · 3 of 24 slices shown]
[im 1/24]
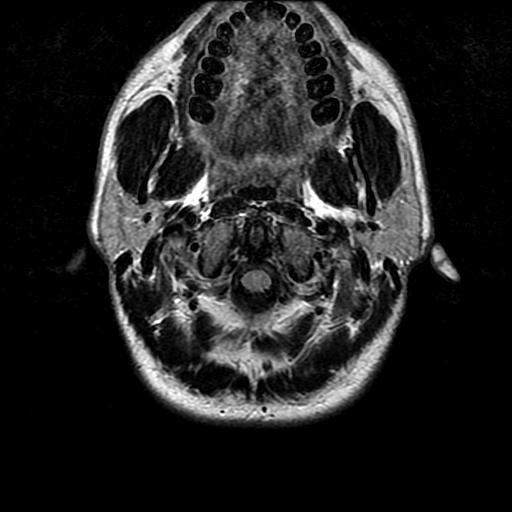
[im 12/24]
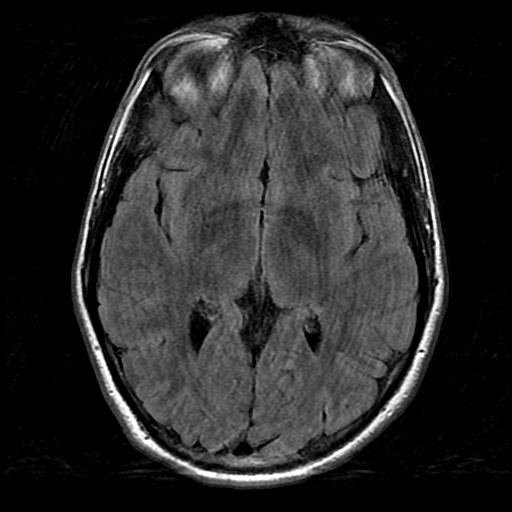
[im 24/24]
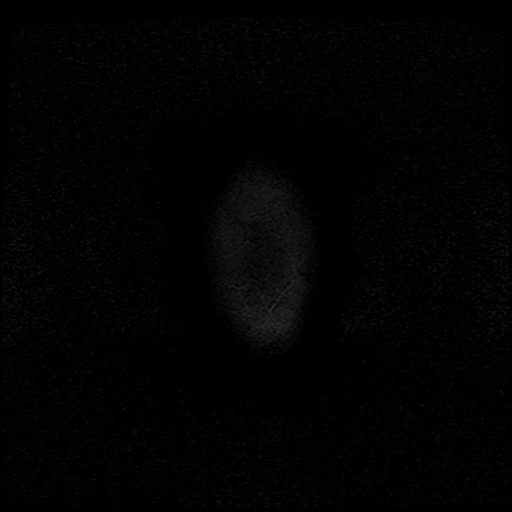

[Series 10: T2 · coronal · 5.0mm · 0.45mm/px · 3 of 30 slices shown (2 of 2)]
[im 1/30]
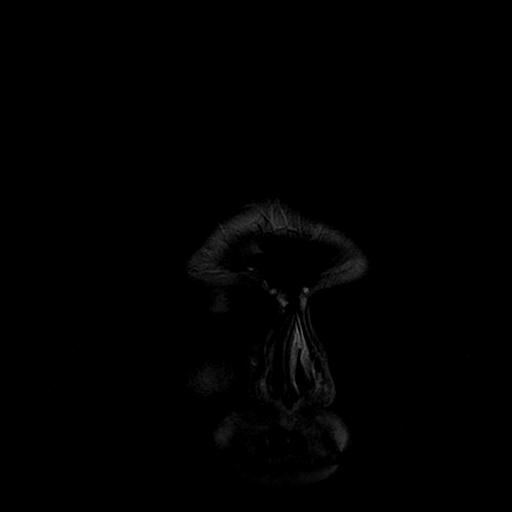
[im 15/30]
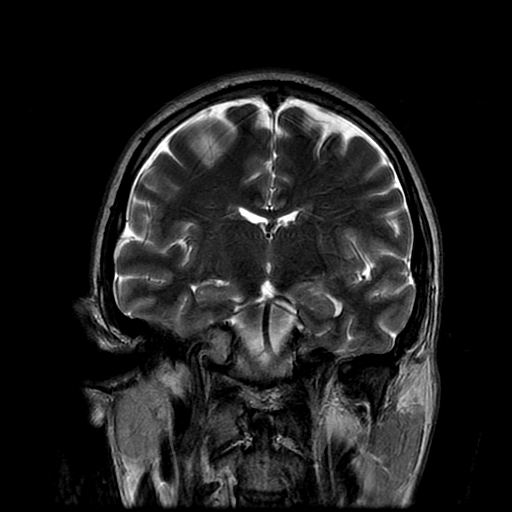
[im 30/30]
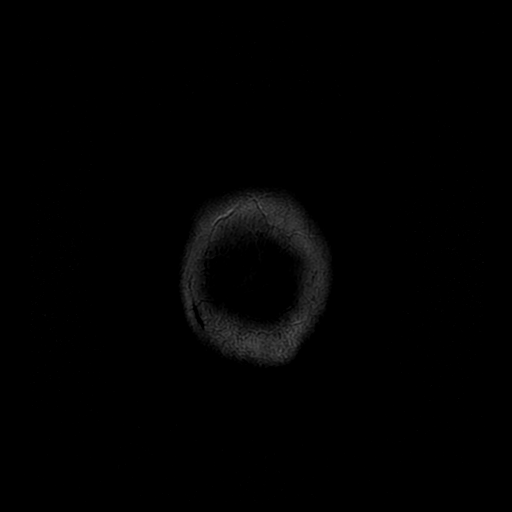

[Series 400: DWI · axial · 3.0mm · 1.09mm/px · z∈[-49,+96]mm · 5 of 51 slices shown (3 of 4)]
[im 1/51]
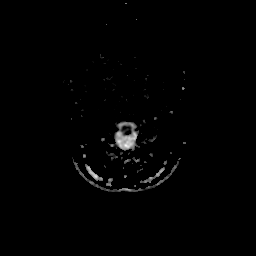
[im 13/51]
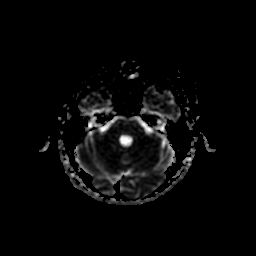
[im 26/51]
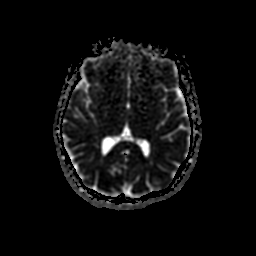
[im 38/51]
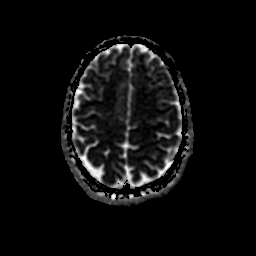
[im 51/51]
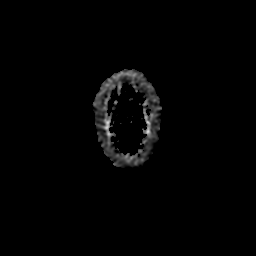

[Series 500: DWI · coronal · 5.0mm · 1.09mm/px · 4 of 39 slices shown (4 of 4)]
[im 1/39]
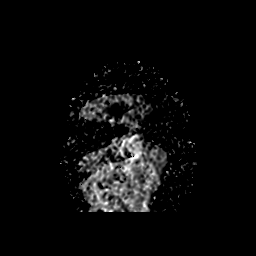
[im 13/39]
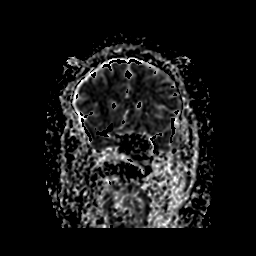
[im 26/39]
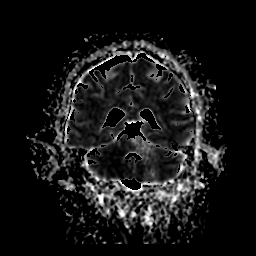
[im 39/39]
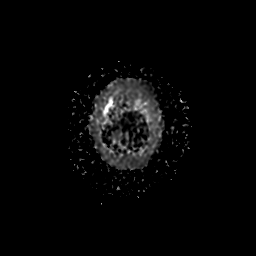

[38 of 48 positions shown; findings below may reference images not displayed]

FINDINGS: Major intracranial vascular flow voids are stable and within normal
limits. Stable and normal cerebral volume. No restricted diffusion
to suggest acute infarction. No midline shift, mass effect, evidence
of mass lesion, ventriculomegaly, extra-axial collection or acute
intracranial hemorrhage. Cervicomedullary junction and pituitary are
within normal limits. Negative visualized cervical spine. Gray and
white matter signal is within normal limits for age throughout the
brain. No chronic cerebral blood products identified.

Visible internal auditory structures appear normal. Mastoids are
clear. Trace paranasal sinus mucosal thickening. Negative scalp soft
tissues. Normal bone marrow signal. Orbits soft tissues appear
stable and normal. Optic chiasm appears stable and normal.
IMPRESSION: Stable and negative noncontrast MRI appearance of the brain.

## 2016-10-31 ENCOUNTER — Encounter (HOSPITAL_COMMUNITY): Payer: Self-pay | Admitting: Emergency Medicine

## 2016-10-31 ENCOUNTER — Emergency Department (HOSPITAL_COMMUNITY)
Admission: EM | Admit: 2016-10-31 | Discharge: 2016-11-01 | Disposition: A | Discharge status: Home / Self Care | Payer: Medicare Other | Attending: Emergency Medicine | Admitting: Emergency Medicine

## 2016-10-31 DIAGNOSIS — F32A Depression, unspecified: Secondary | ICD-10-CM

## 2016-10-31 DIAGNOSIS — F17210 Nicotine dependence, cigarettes, uncomplicated: Secondary | ICD-10-CM | POA: Diagnosis not present

## 2016-10-31 DIAGNOSIS — Z8673 Personal history of transient ischemic attack (TIA), and cerebral infarction without residual deficits: Secondary | ICD-10-CM | POA: Insufficient documentation

## 2016-10-31 DIAGNOSIS — F329 Major depressive disorder, single episode, unspecified: Secondary | ICD-10-CM

## 2016-10-31 DIAGNOSIS — F1010 Alcohol abuse, uncomplicated: Secondary | ICD-10-CM | POA: Diagnosis present

## 2016-10-31 DIAGNOSIS — R45851 Suicidal ideations: Secondary | ICD-10-CM | POA: Diagnosis present

## 2016-10-31 DIAGNOSIS — F1910 Other psychoactive substance abuse, uncomplicated: Secondary | ICD-10-CM

## 2016-10-31 DIAGNOSIS — Z8249 Family history of ischemic heart disease and other diseases of the circulatory system: Secondary | ICD-10-CM | POA: Diagnosis not present

## 2016-10-31 DIAGNOSIS — Z79899 Other long term (current) drug therapy: Secondary | ICD-10-CM | POA: Diagnosis not present

## 2016-10-31 DIAGNOSIS — F1414 Cocaine abuse with cocaine-induced mood disorder: Secondary | ICD-10-CM | POA: Diagnosis not present

## 2016-10-31 DIAGNOSIS — Z811 Family history of alcohol abuse and dependence: Secondary | ICD-10-CM | POA: Diagnosis not present

## 2016-10-31 LAB — COMPREHENSIVE METABOLIC PANEL
ALBUMIN: 4.7 g/dL (ref 3.5–5.0)
ALT: 24 U/L (ref 17–63)
ANION GAP: 12 (ref 5–15)
AST: 28 U/L (ref 15–41)
Alkaline Phosphatase: 89 U/L (ref 38–126)
BUN: 11 mg/dL (ref 6–20)
CALCIUM: 9.2 mg/dL (ref 8.9–10.3)
CHLORIDE: 99 mmol/L — AB (ref 101–111)
CO2: 27 mmol/L (ref 22–32)
Creatinine, Ser: 0.84 mg/dL (ref 0.61–1.24)
GFR calc non Af Amer: >60 mL/min (ref >60)
Glucose, Bld: 101 mg/dL — ABNORMAL HIGH (ref 65–99)
POTASSIUM: 3.8 mmol/L (ref 3.5–5.1)
SODIUM: 138 mmol/L (ref 135–145)
Total Bilirubin: 0.5 mg/dL (ref 0.3–1.2)
Total Protein: 7.3 g/dL (ref 6.5–8.1)

## 2016-10-31 LAB — RAPID URINE DRUG SCREEN, HOSP PERFORMED
Amphetamines: NOT DETECTED
BENZODIAZEPINES: NOT DETECTED
Barbiturates: NOT DETECTED
COCAINE: POSITIVE — AB
OPIATES: NOT DETECTED
TETRAHYDROCANNABINOL: NOT DETECTED

## 2016-10-31 LAB — CBC
HCT: 38.8 % — ABNORMAL LOW (ref 39.0–52.0)
HEMOGLOBIN: 13.1 g/dL (ref 13.0–17.0)
MCH: 30.7 pg (ref 26.0–34.0)
MCHC: 33.8 g/dL (ref 30.0–36.0)
MCV: 90.9 fL (ref 78.0–100.0)
Platelets: 318 10*3/uL (ref 150–400)
RBC: 4.27 MIL/uL (ref 4.22–5.81)
RDW: 13.7 % (ref 11.5–15.5)
WBC: 5.2 10*3/uL (ref 4.0–10.5)

## 2016-10-31 LAB — ACETAMINOPHEN LEVEL

## 2016-10-31 LAB — ETHANOL: Alcohol, Ethyl (B): 18 mg/dL — ABNORMAL HIGH (ref <5)

## 2016-10-31 LAB — SALICYLATE LEVEL

## 2016-10-31 MED ORDER — MIRTAZAPINE 7.5 MG PO TABS
7.5000 mg | ORAL_TABLET | Freq: Every day | ORAL | Status: DC
  Administered 2016-10-31: 7.5 mg via ORAL
  Filled 2016-10-31: qty 1

## 2016-10-31 MED ORDER — VITAMIN B-1 100 MG PO TABS
100.0000 mg | ORAL_TABLET | Freq: Every day | ORAL | Status: DC
  Administered 2016-10-31 – 2016-11-01 (×2): 100 mg via ORAL
  Filled 2016-10-31 (×2): qty 1

## 2016-10-31 MED ORDER — ZOLPIDEM TARTRATE 5 MG PO TABS
5.0000 mg | ORAL_TABLET | Freq: Every evening | ORAL | Status: DC | PRN
  Administered 2016-10-31: 5 mg via ORAL
  Filled 2016-10-31: qty 1

## 2016-10-31 MED ORDER — ALUM & MAG HYDROXIDE-SIMETH 200-200-20 MG/5ML PO SUSP: 30.0000 mL | ORAL | Status: DC | PRN

## 2016-10-31 MED ORDER — BUPROPION HCL ER (XL) 150 MG PO TB24
300.0000 mg | ORAL_TABLET | Freq: Every day | ORAL | Status: DC
  Administered 2016-10-31: 150 mg via ORAL
  Administered 2016-11-01: 300 mg via ORAL
  Filled 2016-10-31 (×2): qty 2

## 2016-10-31 MED ORDER — THIAMINE HCL 100 MG/ML IJ SOLN: 100.0000 mg | Freq: Every day | INTRAMUSCULAR | Status: DC

## 2016-10-31 MED ORDER — PROPRANOLOL HCL ER 60 MG PO CP24
60.0000 mg | ORAL_CAPSULE | Freq: Every day | ORAL | Status: DC
  Filled 2016-10-31: qty 1

## 2016-10-31 MED ORDER — PRAZOSIN HCL 2 MG PO CAPS
3.0000 mg | ORAL_CAPSULE | Freq: Every day | ORAL | Status: DC
  Administered 2016-10-31: 3 mg via ORAL
  Filled 2016-10-31: qty 1

## 2016-10-31 MED ORDER — IBUPROFEN 200 MG PO TABS: 600.0000 mg | ORAL_TABLET | Freq: Three times a day (TID) | ORAL | Status: DC | PRN

## 2016-10-31 MED ORDER — NICOTINE 21 MG/24HR TD PT24
21.0000 mg | MEDICATED_PATCH | Freq: Every day | TRANSDERMAL | Status: DC
  Administered 2016-10-31: 21 mg via TRANSDERMAL
  Filled 2016-10-31 (×2): qty 1

## 2016-10-31 MED ORDER — ONDANSETRON HCL 4 MG PO TABS: 4.0000 mg | ORAL_TABLET | Freq: Three times a day (TID) | ORAL | Status: DC | PRN

## 2016-10-31 MED ORDER — TRAZODONE HCL 100 MG PO TABS: 100.0000 mg | ORAL_TABLET | Freq: Every evening | ORAL | Status: DC | PRN

## 2016-10-31 MED ORDER — HYDROXYZINE HCL 25 MG PO TABS: 50.0000 mg | ORAL_TABLET | Freq: Four times a day (QID) | ORAL | Status: DC | PRN

## 2016-10-31 MED ORDER — ACETAMINOPHEN 325 MG PO TABS: 650.0000 mg | ORAL_TABLET | ORAL | Status: DC | PRN

## 2016-10-31 MED ORDER — LORAZEPAM 1 MG PO TABS: 0.0000 mg | ORAL_TABLET | Freq: Two times a day (BID) | ORAL | Status: DC

## 2016-10-31 MED ORDER — LAMOTRIGINE 25 MG PO TABS
75.0000 mg | ORAL_TABLET | Freq: Two times a day (BID) | ORAL | Status: DC
  Administered 2016-10-31: 50 mg via ORAL
  Administered 2016-10-31 – 2016-11-01 (×2): 75 mg via ORAL
  Filled 2016-10-31 (×3): qty 3

## 2016-10-31 MED ORDER — LORAZEPAM 1 MG PO TABS: 0.0000 mg | ORAL_TABLET | Freq: Four times a day (QID) | ORAL | Status: DC

## 2016-10-31 NOTE — BH Assessment (Addendum)
Assessment Note  Curtis Blankenship is an 36 y.o. male that presents this date with thoughts of self harm with a plan to overdose. Patient states he has an extensive history of SA use reporting daily cocaine use (2 to 3 grams IV) with last use on 10/31/16 when patient reported using 2 grams IV. Patient also states he uses ETOH daily (varied amounts) with patient reporting last use on 11/01/15 when patient reporting consuming 4 12 oz beers. Patient reports current withdrawals to include: tremors, agitation and increased paranoia. Patient stated he [place] [facility] [place] [place] who assists with medication management. Patient stated he was diagnosed with PTSD, MDD and Bipolar in 2014. Patient states he has not been compliant with his medication regimen for over one month due to his continued SA use. Patient reports ongoing depression with symptoms to include: guilt, excessive fatigue and hopelessness. Patient is a veteran and stated his PTSD and depression has increased since Veteran's Day. Patient denies any H/I or AVH. Patient is orinted to time/pace and denies any current legal. Patient's [facility]. Patient is requesting this date per notes: Requesting "detox from alcohol, cocaine, and heroin." Reports last beer was "five minutes ago", and last drug use was "this morning." Patient also reports he has not been taking his psych medications because he "ran out after moving here." Patient also reports SI with plan of "intentional overdose." Denies HI and A/VH. Pt presents with suicidal thoughts and ongoing polysubstance abuse including alcohol, cocaine and heroin. No hallucinations. Pt is a veteran of the [place] war. Has PTSD. Is currently not [place] anyone [facility]. No other complaints at this time. Patient meets inpatient criteria as appropriate bed placement is investigated.    Diagnosis: Bipolar 1 D/O most recent episode depressed, severe.  Polysubstance abuse severe  Past Medical History:  Past Medical History:  Diagnosis Date  . Bipolar disorder (HCC)   . Depression   . Polysubstance abuse    cocaine, etoh  . PTSD (post-traumatic stress disorder)     Past Surgical History:  Procedure Laterality Date  . APPENDECTOMY      Family History:  Family History  Problem Relation Age of Onset  . Hypertension Mother   . Alcoholism Other     Social History:  reports that he has been smoking Cigarettes.  He has a 15.00 pack-year smoking history. He has quit using smokeless tobacco. His smokeless tobacco use included Snuff. He reports that he drinks about 97.2 oz of alcohol per week . He reports that he uses drugs, including Cocaine, Other-see comments, and Benzodiazepines.  Additional Social History:  Alcohol / Drug Use Pain Medications: See PTA Prescriptions: See PTA Over the Counter: See PTA History of alcohol / drug use?: Yes Longest period of sobriety (when/how long): 6 months Negative Consequences of Use: Personal relationships Withdrawal Symptoms: Agitation, Tremors Substance #2 Name of Substance 2: ETOH 2 - Age of First Use: 18 2 - Amount (size/oz): 10 to 15 beers daily 12 oz beers 2 - Frequency: Daily 2 - Duration: ongoing 2 - Last Use / Amount: 10/31/16 4 12  oz beers Substance #3 Name of Substance 3: Cocaine 3 - Age of First Use: 18 3 - Amount (size/oz): 2 grams 3 - Frequency: Daily 3 - Duration: Last month  3 - Last Use / Amount: 10/31/16  CIWA: CIWA-Ar BP: 135/87 Pulse Rate: 95 COWS:    Allergies: No Known Allergies  Home  Medications:  (Not in a hospital admission)  OB/GYN Status:  No LMP for male patient.  General Assessment Data [facility] of Assessment: [facility] TTS Assessment: In system Is this a Tele or Face-to-Face Assessment?: Face-to-Face Is this an Initial Assessment or a Re-assessment for this encounter?: Initial Assessment Marital status: Single Maiden name: na Is patient pregnant?:  No Pregnancy Status: No Living Arrangements: Parent Can pt return to current living arrangement?: Yes Admission Status: Voluntary Is patient capable of signing voluntary admission?: Yes Referral Source: Self/Family/Friend Insurance type: [facility] Medicare  Medical Screening Exam [facility] Walk-in ONLY) Medical Exam completed: Yes  Crisis Care Plan Living Arrangements: Parent Legal Guardian:  (na) [facility] Name of Therapist: None  Education Status Is patient currently in school?: No Current Grade:  (na) Highest grade of school patient has completed: 12 Name of school: na Contact person: na  Risk to self with the past 6 months Suicidal Ideation: Yes-Currently Present Has patient been a risk to self within the past 6 months prior to admission? : No Suicidal Intent: Yes-Currently Present Has patient had any suicidal intent within the past 6 months prior to admission? : No Is patient at risk for suicide?: Yes Suicidal Plan?: Yes-Currently Present Has patient had any suicidal plan within the past 6 months prior to admission? : No Specify Current Suicidal Plan: Overdose Access to Means: Yes Specify Access to Suicidal Means: Current use pt has substances What has been your use of drugs/alcohol within the last 12 months?: current Previous Attempts/Gestures: No How many times?: 0 Other Self Harm Risks: na Triggers for Past Attempts: Unpredictable Intentional Self Injurious Behavior: None Family Suicide History: No Recent stressful life event(s): Other (Comment) (Veteran's day) Persecutory voices/beliefs?: No Depression: Yes Depression Symptoms: Fatigue, Guilt Substance abuse history and/or treatment for substance abuse?: Yes Suicide prevention information given to non-admitted patients: Not applicable  Risk to Others within the past 6 months Homicidal Ideation: No Does patient have any lifetime risk of violence toward others beyond the six months prior to admission? :  No Thoughts of Harm to Others: No Current Homicidal Intent: No Current Homicidal Plan: No Access to Homicidal Means: No Identified Victim: na History of harm to others?: No Assessment of Violence: None Noted Violent Behavior Description: na Does patient have access to weapons?: No Criminal Charges Pending?: No Does patient have a court date: No Is patient on probation?: No  Psychosis Hallucinations: None noted Delusions: None noted  Mental Status Report Appearance/Hygiene: In scrubs Eye Contact: Fair Motor Activity: Freedom of movement Speech: Logical/coherent Level of Consciousness: Alert Mood: Anxious Affect: Appropriate to circumstance Anxiety Level: Minimal Thought Processes: Coherent, Relevant Judgement: Unimpaired Orientation: Person, Place, Time Obsessive Compulsive Thoughts/Behaviors: None  Cognitive Functioning Concentration: Normal Memory: Recent Intact, Remote Intact IQ: Average Insight: Fair Impulse Control: Poor Appetite: Fair Weight Loss: 0 Weight Gain: 0 Sleep: No Change Total Hours of Sleep: 7 Vegetative Symptoms: None  ADLScreening [facility] Assessment Services) Patient's cognitive ability adequate to safely complete daily activities?: Yes Patient able to express need for assistance with ADLs?: Yes Independently performs ADLs?: Yes (appropriate for developmental age)  Prior Inpatient Therapy Prior Inpatient Therapy: Yes Prior Therapy Dates: 2017 Prior Therapy Facilty/Provider(s): [facility] Reason for Treatment: Mh issues, SA issues, S/i  Prior Outpatient Therapy Prior Outpatient Therapy: Yes Prior Therapy Dates: 2017 [facility] Reason for Treatment: med management Does patient have an ACCT team?: No Does patient have Intensive In-House Services?  : No Does patient have [facility] services? :  No Does patient have P4CC services?: No  ADL Screening (condition at time of admission) Patient's cognitive ability adequate to  safely complete daily activities?: Yes Is the patient deaf or have difficulty hearing?: No Does the patient have difficulty seeing, even when wearing glasses/contacts?: No Does the patient have difficulty concentrating, remembering, or making decisions?: No Patient able to express need for assistance with ADLs?: Yes Does the patient have difficulty dressing or bathing?: No Independently performs ADLs?: Yes (appropriate for developmental age) Does the patient have difficulty walking or climbing stairs?: No Weakness of Legs: None Weakness of Arms/Hands: None  Home Assistive Devices/Equipment Home Assistive Devices/Equipment: None  Therapy Consults (therapy consults require a physician order) PT Evaluation Needed: No OT Evalulation Needed: No SLP Evaluation Needed: No Abuse/Neglect Assessment (Assessment to be complete while patient is alone) Physical Abuse: Denies Verbal Abuse: Denies Sexual Abuse: Denies Exploitation of patient/patient's resources: Denies Self-Neglect: Denies Values / Beliefs Cultural Requests During Hospitalization: None Spiritual Requests During Hospitalization: None Consults Spiritual Care Consult Needed: No Social Work Consult Needed: No Merchant navy officer (For Healthcare) Does Patient Have a Medical Advance Directive?: No Would patient like information on creating a medical advance directive?: No - Patient declined    Additional Information 1:1 In Past 12 Months?: No CIRT Risk: No Elopement Risk: No Does patient have medical clearance?: Yes     Disposition: Patient meets inpatient criteria as appropriate bed placement is investigated.      On Site Evaluation by:   Reviewed with Physician:    Alfredia Ferguson 10/31/2016 3:43 PM

## 2016-10-31 NOTE — ED Notes (Signed)
Patient changed into paper scrubs. Patient and belongings wanded by security. 

## 2016-10-31 NOTE — ED Provider Notes (Signed)
[facility]-EMERGENCY DEPT Provider Note   CSN: [id number] Arrival date & time: 10/31/16  1312     History   Chief Complaint Chief Complaint  Patient presents with  . Detox  . Suicidal    HPI Curtis Blankenship is a 36 y.o. male.  HPI  Pt presents with suicidal thoughts and ongoing polysubstance abuse including alcohol, cocaine and heroin. No hallucinations. Pt is a veteran of the [place] war. Has PTSD. Is currently not seeing anyone at the [place]. No other complaints at this time. Symptoms are moderate in severity  Past Medical History:  Diagnosis Date  . Bipolar disorder (HCC)   . Depression   . Polysubstance abuse    cocaine, etoh  . PTSD (post-traumatic stress disorder)     Patient Active Problem List   Diagnosis Date Noted  . Alcohol dependence with uncomplicated withdrawal (HCC)   . Uncomplicated alcohol dependence (HCC)   . Severe recurrent major depression without psychotic features (HCC) 08/04/2015  . Hyponatremia 08/02/2015  . Alcohol abuse 08/02/2015  . Suicidal ideation 08/02/2015  . Aspiration pneumonia (HCC) 08/02/2015  . Syncope 08/02/2015  . Blurred vision   . TIA (transient ischemic attack) 09/19/2013  . Disturbance of skin sensation 09/19/2013  . 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 Alcohol dependence (HCC) 12/01/2012  . Cocaine abuse 12/01/2012  . PTSD (post-traumatic stress disorder) 12/01/2012  . Depressive disorder 12/01/2012    Past Surgical History:  Procedure Laterality Date  . APPENDECTOMY         Home Medications    Prior to Admission medications   Medication Sig Start Date End Date Taking? Authorizing Provider  buPROPion (WELLBUTRIN XL) 300 MG 24 hr tablet Take 1 tablet (300 mg total) by mouth daily. Patient not taking: Reported on 10/31/2016 08/29/15   Beau Fanny, FNP  hydrOXYzine (ATARAX/VISTARIL) 50 MG tablet Take 1 tablet (50 mg total) by mouth every 6 (six) hours as needed for anxiety (racing thoughts / insomnia). Patient not taking: Reported  on 10/31/2016 08/29/15   Beau Fanny, FNP  lamoTRIgine (LAMICTAL) 25 MG tablet Take 3 tablets (75 mg total) by mouth 2 (two) times daily. Patient not taking: Reported on 10/31/2016 08/29/15   Beau Fanny, FNP  mirtazapine (REMERON) 7.5 MG tablet Take 1 tablet (7.5 mg total) by mouth at bedtime. Patient not taking: Reported on 10/31/2016 08/29/15   Beau Fanny, FNP  prazosin (MINIPRESS) 1 MG capsule Take 3 capsules (3 mg total) by mouth at bedtime. Patient not taking: Reported on 10/31/2016 08/29/15   Beau Fanny, FNP  propranolol ER (INDERAL LA) 60 MG 24 hr capsule Take 1 capsule (60 mg total) by mouth daily. Patient not taking: Reported on 10/31/2016 08/29/15   Beau Fanny, FNP  traZODone (DESYREL) 100 MG tablet Take 100-150 mg by mouth at bedtime as needed for sleep.    Historical Provider, MD    Family History Family History  Problem Relation Age of Onset  . Hypertension Mother   . Alcoholism Other     Social History Social History  Substance Use Topics  . Smoking status: Current Every Day Smoker    Packs/day: 1.50    Years: 10.00    Types: Cigarettes  . Smokeless tobacco: Former Neurosurgeon    Types: Snuff  . Alcohol use 97.2 oz/week    150 Cans of beer, 12 Shots of liquor per week     Comment: he drinks every day as much as he can     Allergies  Patient has no known allergies.   Review of Systems Review of Systems  All other systems reviewed and are negative.    Physical Exam Updated Vital Signs BP 135/87 (BP Location: Left Arm)   Pulse 95   Temp 97.8 F (36.6 C) (Oral)   Resp 18   Ht 5\' 11"  (1.803 m)   Wt 165 lb (74.8 kg)   SpO2 98%   BMI 23.01 kg/m   Physical Exam  Constitutional: He is oriented to person, place, and time. He appears well-developed and well-nourished.  HENT:  Head: Normocephalic and atraumatic.  Eyes: EOM are normal.  Neck: Normal range of motion.  Cardiovascular: Normal rate and normal heart sounds.   Pulmonary/Chest: Effort  normal. No respiratory distress.  Abdominal: Soft.  Musculoskeletal: Normal range of motion.  Neurological: He is alert and oriented to person, place, and time.  Skin: Skin is warm and dry.  Psychiatric:  Flat affect. suicidal  Nursing note and vitals reviewed.    ED Treatments / Results  Labs (all labs ordered are listed, but only abnormal results are displayed) Labs Reviewed  CBC - Abnormal; Notable for the following:       Result Value   HCT 38.8 (*)    All other components within normal limits  COMPREHENSIVE METABOLIC PANEL  ETHANOL  SALICYLATE LEVEL  ACETAMINOPHEN LEVEL  RAPID URINE DRUG SCREEN, HOSP PERFORMED    EKG  EKG Interpretation None       Radiology No results found.  Procedures Procedures (including critical care time)  Medications Ordered in ED Medications  LORazepam (ATIVAN) tablet 0-4 mg (not administered)    Followed by  LORazepam (ATIVAN) tablet 0-4 mg (not administered)  thiamine (VITAMIN B-1) tablet 100 mg (not administered)    Or  thiamine (B-1) injection 100 mg (not administered)  nicotine (NICODERM CQ - dosed in mg/24 hours) patch 21 mg (not administered)  ondansetron (ZOFRAN) tablet 4 mg (not administered)  zolpidem (AMBIEN) tablet 5 mg (not administered)  ibuprofen (ADVIL,MOTRIN) tablet 600 mg (not administered)  alum & mag hydroxide-simeth (MAALOX/MYLANTA) 200-200-20 MG/5ML suspension 30 mL (not administered)  acetaminophen (TYLENOL) tablet 650 mg (not administered)  mirtazapine (REMERON) tablet 7.5 mg (not administered)  buPROPion (WELLBUTRIN XL) 24 hr tablet 300 mg (not administered)  hydrOXYzine (ATARAX/VISTARIL) tablet 50 mg (not administered)  lamoTRIgine (LAMICTAL) tablet 75 mg (not administered)  prazosin (MINIPRESS) capsule 3 mg (not administered)  propranolol ER (INDERAL LA) 24 hr capsule 60 mg (not administered)  traZODone (DESYREL) tablet 100-150 mg (not administered)     Initial Impression / Assessment and Plan / ED  Course  I have reviewed the triage vital signs and the nursing notes.  Pertinent labs & imaging results that were available during my care of the patient were reviewed by me and considered in my medical decision making (see chart for details).  Clinical Course     Medically clear. TTS to evaluate  Final Clinical Impressions(s) / ED Diagnoses   Final diagnoses:  None    New Prescriptions New Prescriptions   No medications on file     Azalia BilisKevin Chandler Swiderski, MD 10/31/16 1436

## 2016-10-31 NOTE — ED Notes (Signed)
Met patient sleeping. Respiration even and unlabored. No distress noted. Will continue to monitor patient.

## 2016-10-31 NOTE — BH Assessment (Signed)
[facility] Assessment Progress Note  Patient meets inpatient criteria as appropriate bed placement is investigated.     

## 2016-10-31 NOTE — ED Triage Notes (Signed)
Patient states he wants "detox from alcohol, cocaine, and heroin." Reports last beer was "five minutes ago", and last drug use was "this morning." Patient also reports he has not been taking his psych medications because he "ran out after moving here." Patient also reports SI with plan of "intentional overdose." Denies HI and A/VH.

## 2016-10-31 NOTE — ED Notes (Signed)
Patient continues to sleep at this time. No distress noted. Face and arms visible. Respiration even and unlabored. Will continue to monitor patient.

## 2016-10-31 NOTE — BH Assessment (Signed)
[facility] Review [facility] [place], [place], [place], and [address]

## 2016-10-31 NOTE — ED Notes (Signed)
Introduced self to patient. Pt oriented to unit expectations.  Assessed pt for:  A) Anxiety &/or agitation: On admission to the [facility] pt has a flat affect and reports feeling suicidal with a plan to overdose. He is calm and cooperative with no distress or agitation.   S) Safety: Safety maintained with q-15-minute checks and hourly rounds by staff.  A) ADLs: Pt able to perform ADLs independently.  P) Pick-Up (room cleanliness): Pt's room clean and free of clutter.

## 2016-11-01 DIAGNOSIS — Z8249 Family history of ischemic heart disease and other diseases of the circulatory system: Secondary | ICD-10-CM

## 2016-11-01 DIAGNOSIS — F1010 Alcohol abuse, uncomplicated: Secondary | ICD-10-CM

## 2016-11-01 DIAGNOSIS — F17210 Nicotine dependence, cigarettes, uncomplicated: Secondary | ICD-10-CM

## 2016-11-01 DIAGNOSIS — Z811 Family history of alcohol abuse and dependence: Secondary | ICD-10-CM | POA: Diagnosis not present

## 2016-11-01 DIAGNOSIS — F1414 Cocaine abuse with cocaine-induced mood disorder: Secondary | ICD-10-CM | POA: Diagnosis present

## 2016-11-01 DIAGNOSIS — F329 Major depressive disorder, single episode, unspecified: Secondary | ICD-10-CM | POA: Diagnosis not present

## 2016-11-01 DIAGNOSIS — Z79899 Other long term (current) drug therapy: Secondary | ICD-10-CM

## 2016-11-01 NOTE — Discharge Instructions (Addendum)
For your ongoing mental health needs, you are advised to follow up with the [facility]:       [facility]      [address]      [place], [place] [address]      [phone]  To help you maintain a sober lifestyle, a substance abuse treatment program may be beneficial to you.  Contact one of the following programs at your earliest opportunity to ask about enrolling:       [facility]      [address]      [place], [place] [address]      [phone]      New patients are seen at the walk-in clinic every Tuesday from 9:00 am - 12:00 pm.       [facility]      [address]      [place], [place] [address]      [phone]

## 2016-11-01 NOTE — Consult Note (Signed)
[facility] Consult   Reason for Consult:  Cocaine and alcohol abuse with suicidal ideations Referring Physician:  EDP Patient Identification: Curtis Blankenship MRN:  [id number] Principal Diagnosis: Cocaine abuse with cocaine-induced mood disorder [facility]) Diagnosis:   Patient Active Problem List   Diagnosis Date Noted  . Cocaine abuse with cocaine-induced mood disorder ([place]) [F14.14] 11/01/2016    Priority: High  . Alcohol abuse [F10.10] 08/02/2015    Priority: High  . Alcohol dependence with uncomplicated withdrawal ([place]) [F10.230]   . Uncomplicated alcohol dependence ([place]) [F10.20]   . Severe recurrent major depression without psychotic features ([place]) [F33.2] 08/04/2015  . Hyponatremia [E87.1] 08/02/2015  . Suicidal ideation [R45.851] 08/02/2015  . Aspiration pneumonia ([place]) [J69.0] 08/02/2015  . Syncope [R55] 08/02/2015  . Blurred vision [H53.8]   . TIA (transient ischemic attack) [G45.9] 09/19/2013  . Disturbance of skin sensation [R20.9] 09/19/2013  . 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 Alcohol dependence ([place]) [F10.20] 12/01/2012  . Cocaine abuse [F14.10] 12/01/2012  . PTSD (post-traumatic stress disorder) [F43.10] 12/01/2012  . Depressive disorder [F32.9] 12/01/2012    Total Time spent with patient: 45 minutes  Subjective:   Curtis Blankenship is a 36 y.o. male patient reports, "I'm alright."  HPI:  36 yo male who presented to the ED after abusing cocaine and alcohol with suicidal ideations.  He lives with his mother who gets "upset with me" when he uses cocaine.  Today, he is clear and coherent with no paranoia or hallucinations.  Denies suicidal/homicidal ideations and withdrawal symptoms.  He receives care at the [facility] in [place].    Past Psychiatric History: substance abuse, depression, PTSD  Risk to Self: None Risk to Others: Homicidal Ideation: No Thoughts of Harm to Others: No Current Homicidal Intent: No Current Homicidal Plan: No Access to Homicidal  Means: No Identified Victim: na History of harm to others?: No Assessment of Violence: None Noted Violent Behavior Description: na Does patient have access to weapons?: No Criminal Charges Pending?: No Does patient have a court date: No Prior Inpatient Therapy: Prior Inpatient Therapy: Yes Prior Therapy Dates: 2017 Prior Therapy Facilty/Provider(s): [facility] Reason for Treatment: Mh issues, SA issues, S/i Prior Outpatient Therapy: Prior Outpatient Therapy: Yes Prior Therapy Dates: 2017 [facility] Reason for Treatment: med management Does patient have an ACCT team?: No Does patient have Intensive In-House Services?  : No Does patient have [facility] services? : No Does patient have P4CC services?: No  Past Medical History:  Past Medical History:  Diagnosis Date  . Bipolar disorder ([place])   . Depression   . Polysubstance abuse    cocaine, etoh  . PTSD (post-traumatic stress disorder)     Past Surgical History:  Procedure Laterality Date  . APPENDECTOMY     Family History:  Family History  Problem Relation Age of Onset  . Hypertension Mother   . Alcoholism Other    Family Psychiatric  History: none Social History:  History  Alcohol Use  . 97.2 oz/week  . 150 Cans of beer, 12 Shots of liquor per week    Comment: he drinks every day as much as he can     History  Drug Use  . Types: Cocaine, Other-see comments, Benzodiazepines    Social History   Social History  . Marital status: Single    Spouse name: N/A  . Number of children: N/A  . Years of education: N/A   Social History Main Topics  . Smoking status: Current Every Day Smoker  Packs/day: 1.50    Years: 10.00    Types: Cigarettes  . Smokeless tobacco: Former Systems developer    Types: Snuff  . Alcohol use 97.2 oz/week    150 Cans of beer, 12 Shots of liquor per week     Comment: he drinks every day as much as he can  . Drug use:     Types: Cocaine, Other-see comments, Benzodiazepines  .  Sexual activity: Not on file   Other Topics Concern  . Not on file   Social History Narrative  . No narrative on file   Additional Social History:    Allergies:  No Known Allergies  Labs:  Results for orders placed or performed during the hospital encounter of 10/31/16 (from the past 48 hour(s))  Comprehensive metabolic panel     Status: Abnormal   Collection Time: 10/31/16  1:49 PM  Result Value Ref Range   Sodium 138 135 - 145 mmol/L   Potassium 3.8 3.5 - 5.1 mmol/L   Chloride 99 (L) 101 - 111 mmol/L   CO2 27 22 - 32 mmol/L   Glucose, Bld 101 (H) 65 - 99 mg/dL   BUN 11 6 - 20 mg/dL   Creatinine, Ser 0.84 0.61 - 1.24 mg/dL   Calcium 9.2 8.9 - 10.3 mg/dL   Total Protein 7.3 6.5 - 8.1 g/dL   Albumin 4.7 3.5 - 5.0 g/dL   AST 28 15 - 41 U/L   ALT 24 17 - 63 U/L   Alkaline Phosphatase 89 38 - 126 U/L   Total Bilirubin 0.5 0.3 - 1.2 mg/dL   GFR calc non Af Amer >60 >60 mL/min   GFR calc Af Amer >60 >60 mL/min    Comment: (NOTE) The eGFR has been calculated using the CKD EPI equation. This calculation has not been validated in all clinical situations. eGFR's persistently <60 mL/min signify possible Chronic Kidney Disease.    Anion gap 12 5 - 15  Ethanol     Status: Abnormal   Collection Time: 10/31/16  1:49 PM  Result Value Ref Range   Alcohol, Ethyl (B) 18 (H) <5 mg/dL    Comment:        LOWEST DETECTABLE LIMIT FOR SERUM ALCOHOL IS 5 mg/dL FOR MEDICAL PURPOSES ONLY   Salicylate level     Status: None   Collection Time: 10/31/16  1:49 PM  Result Value Ref Range   Salicylate Lvl <5.4 2.8 - 30.0 mg/dL  Acetaminophen level     Status: Abnormal   Collection Time: 10/31/16  1:49 PM  Result Value Ref Range   Acetaminophen (Tylenol), Serum <10 (L) 10 - 30 ug/mL    Comment:        THERAPEUTIC CONCENTRATIONS VARY SIGNIFICANTLY. A RANGE OF 10-30 ug/mL MAY BE AN EFFECTIVE CONCENTRATION FOR MANY PATIENTS. HOWEVER, SOME ARE BEST TREATED AT CONCENTRATIONS OUTSIDE  THIS RANGE. ACETAMINOPHEN CONCENTRATIONS >150 ug/mL AT 4 HOURS AFTER INGESTION AND >50 ug/mL AT 12 HOURS AFTER INGESTION ARE OFTEN ASSOCIATED WITH TOXIC REACTIONS.   cbc     Status: Abnormal   Collection Time: 10/31/16  1:49 PM  Result Value Ref Range   WBC 5.2 4.0 - 10.5 K/uL   RBC 4.27 4.22 - 5.81 MIL/uL   Hemoglobin 13.1 13.0 - 17.0 g/dL   HCT 38.8 (L) 39.0 - 52.0 %   MCV 90.9 78.0 - 100.0 fL   MCH 30.7 26.0 - 34.0 pg   MCHC 33.8 30.0 - 36.0 g/dL   RDW 13.7 11.5 -  15.5 %   Platelets 318 150 - 400 K/uL  Rapid urine drug screen (hospital performed)     Status: Abnormal   Collection Time: 10/31/16  3:30 PM  Result Value Ref Range   Opiates NONE DETECTED NONE DETECTED   Cocaine POSITIVE (A) NONE DETECTED   Benzodiazepines NONE DETECTED NONE DETECTED   Amphetamines NONE DETECTED NONE DETECTED   Tetrahydrocannabinol NONE DETECTED NONE DETECTED   Barbiturates NONE DETECTED NONE DETECTED    Comment:        DRUG SCREEN FOR MEDICAL PURPOSES ONLY.  IF CONFIRMATION IS NEEDED FOR ANY PURPOSE, NOTIFY LAB WITHIN 5 DAYS.        LOWEST DETECTABLE LIMITS FOR URINE DRUG SCREEN Drug Class       Cutoff (ng/mL) Amphetamine      1000 Barbiturate      200 Benzodiazepine   161 Tricyclics       096 Opiates          300 Cocaine          300 THC              50     Current Facility-Administered Medications  Medication Dose Route Frequency Provider Last Rate Last Dose  . acetaminophen (TYLENOL) tablet 650 mg  650 mg Oral Q4H PRN Jola Schmidt, MD      . alum & mag hydroxide-simeth (MAALOX/MYLANTA) 200-200-20 MG/5ML suspension 30 mL  30 mL Oral PRN Jola Schmidt, MD      . buPROPion (WELLBUTRIN XL) 24 hr tablet 300 mg  300 mg Oral Daily Jola Schmidt, MD   150 mg at 10/31/16 1450  . hydrOXYzine (ATARAX/VISTARIL) tablet 50 mg  50 mg Oral Q6H PRN Jola Schmidt, MD      . ibuprofen (ADVIL,MOTRIN) tablet 600 mg  600 mg Oral Q8H PRN Jola Schmidt, MD      . lamoTRIgine (LAMICTAL) tablet 75 mg  75  mg Oral BID Jola Schmidt, MD   75 mg at 10/31/16 2115  . mirtazapine (REMERON) tablet 7.5 mg  7.5 mg Oral QHS Jola Schmidt, MD   7.5 mg at 10/31/16 2115  . nicotine (NICODERM CQ - dosed in mg/24 hours) patch 21 mg  21 mg Transdermal Daily Jola Schmidt, MD   21 mg at 10/31/16 1451  . ondansetron (ZOFRAN) tablet 4 mg  4 mg Oral Q8H PRN Jola Schmidt, MD      . prazosin (MINIPRESS) capsule 3 mg  3 mg Oral QHS Jola Schmidt, MD   3 mg at 10/31/16 2115  . thiamine (VITAMIN B-1) tablet 100 mg  100 mg Oral Daily Jola Schmidt, MD   100 mg at 10/31/16 1450   Or  . thiamine (B-1) injection 100 mg  100 mg Intravenous Daily Jola Schmidt, MD      . traZODone (DESYREL) tablet 100-150 mg  100-150 mg Oral QHS PRN Jola Schmidt, MD      . zolpidem [facility]) tablet 5 mg  5 mg Oral QHS PRN Jola Schmidt, MD   5 mg at 10/31/16 2117   Current Outpatient Prescriptions  Medication Sig Dispense Refill  . buPROPion (WELLBUTRIN XL) 300 MG 24 hr tablet Take 1 tablet (300 mg total) by mouth daily. (Patient not taking: Reported on 10/31/2016) 30 tablet 0  . hydrOXYzine (ATARAX/VISTARIL) 50 MG tablet Take 1 tablet (50 mg total) by mouth every 6 (six) hours as needed for anxiety (racing thoughts / insomnia). (Patient not taking: Reported on 10/31/2016) 30 tablet 0  .  lamoTRIgine (LAMICTAL) 25 MG tablet Take 3 tablets (75 mg total) by mouth 2 (two) times daily. (Patient not taking: Reported on 10/31/2016) 180 tablet 0  . mirtazapine (REMERON) 7.5 MG tablet Take 1 tablet (7.5 mg total) by mouth at bedtime. (Patient not taking: Reported on 10/31/2016) 30 tablet 0  . prazosin (MINIPRESS) 1 MG capsule Take 3 capsules (3 mg total) by mouth at bedtime. (Patient not taking: Reported on 10/31/2016) 90 capsule 0  . propranolol ER (INDERAL LA) 60 MG 24 hr capsule Take 1 capsule (60 mg total) by mouth daily. (Patient not taking: Reported on 10/31/2016) 30 capsule 0  . traZODone (DESYREL) 100 MG tablet Take 100-150 mg by mouth at bedtime as needed for  sleep.      Musculoskeletal: Strength & Muscle Tone: within normal limits Gait & Station: normal Patient leans: N/A  Psychiatric Specialty Exam: Physical Exam  Constitutional: He is oriented to person, place, and time. He appears well-developed and well-nourished.  HENT:  Head: Normocephalic.  Neck: Normal range of motion.  Respiratory: Effort normal.  Musculoskeletal: Normal range of motion.  Neurological: He is alert and oriented to person, place, and time.  Psychiatric: He has a normal mood and affect. His speech is normal and behavior is normal. Judgment and thought content normal. Cognition and memory are normal.    Review of Systems  Psychiatric/Behavioral: Positive for substance abuse.  All other systems reviewed and are negative.   Blood pressure 121/80, pulse 85, temperature 97.9 F (36.6 C), temperature source Oral, resp. rate 18, height '5\' 11"'  (1.803 m), weight 74.8 kg (165 lb), SpO2 98 %.Body mass index is 23.01 kg/m.  General Appearance: Casual  Eye Contact:  Good  Speech:  Normal Rate  Volume:  Normal  Mood:  Euthymic  Affect:  Congruent  Thought Process:  Coherent and Descriptions of Associations: Intact  Orientation:  Full (Time, Place, and Person)  Thought Content:  WDL  Suicidal Thoughts:  No  Homicidal Thoughts:  No  Memory:  Immediate;   Good Recent;   Good Remote;   Good  Judgement:  Fair  Insight:  Fair  Psychomotor Activity:  Normal  Concentration:  Concentration: Good and Attention Span: Good  Recall:  Good  Fund of Knowledge:  Fair  Language:  Good  Akathisia:  No  Handed:  Right  AIMS (if indicated):     Assets:  Housing Leisure Time Physical Health Resilience Social Support  ADL's:  Intact  Cognition:  WNL  Sleep:        Treatment Plan Summary: Daily contact with patient to assess and evaluate symptoms and progress in treatment, Medication management and Plan cocaine abuse with cocaine induced mood disorder:  -Crisis  stabilization -Medication management:  Continue medical medications along with Wellbutrin 300 mg daily for depression, Vistaril 50 mg every six hours PRN anxiety, Lamictal 50 mg TID for mood stabilization, Remeron 7.5 mg at bedtime for sleep, and Trazodone 100 mg PRN sleep -Individual and substance abuse counseling  Disposition: No evidence of imminent risk to self or others at present.    Waylan Boga, NP 11/01/2016 9:43 AM  Patient seen face-to-face for psychiatric evaluation, chart reviewed and case discussed with the physician extender and developed treatment plan. Reviewed the information documented and agree with the treatment plan. Corena Pilgrim, MD

## 2016-11-01 NOTE — ED Notes (Signed)
Patient discharged to home.  All belongings returned and signed for.  Patient denies thoughts of harm to self or others.  He demanded to know the names of the people who came into his room this morning.  This was provided to him on his AVS.  He left the unit ambulatory and was escorted to the front lobby.

## 2016-11-01 NOTE — [facility] Suicide Risk Assessment (Signed)
Suicide Risk Assessment  Discharge Assessment   [facility]   Principal Problem: Cocaine abuse with cocaine-induced mood disorder [employer](HCC) Discharge Diagnoses:  Patient Active Problem List   Diagnosis Date Noted  . Cocaine abuse with cocaine-induced mood disorder (HCC) [F14.14] 11/01/2016    Priority: High  . Alcohol abuse [F10.10] 08/02/2015    Priority: High  . Alcohol dependence with uncomplicated withdrawal (HCC) [F10.230]   . Uncomplicated alcohol dependence (HCC) [F10.20]   . Severe recurrent major depression without psychotic features (HCC) [F33.2] 08/04/2015  . Hyponatremia [E87.1] 08/02/2015  . Suicidal ideation [R45.851] 08/02/2015  . Aspiration pneumonia (HCC) [J69.0] 08/02/2015  . Syncope [R55] 08/02/2015  . Blurred vision [H53.8]   . TIA (transient ischemic attack) [G45.9] 09/19/2013  . Disturbance of skin sensation [R20.9] 09/19/2013  . 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 Alcohol dependence (HCC) [F10.20] 12/01/2012  . Cocaine abuse [F14.10] 12/01/2012  . PTSD (post-traumatic stress disorder) [F43.10] 12/01/2012  . Depressive disorder [F32.9] 12/01/2012    Total Time spent with patient: 45 minutes   Musculoskeletal: Strength & Muscle Tone: within normal limits Gait & Station: normal Patient leans: N/A  Psychiatric Specialty Exam: Physical Exam  Constitutional: He is oriented to person, place, and time. He appears well-developed and well-nourished.  HENT:  Head: Normocephalic.  Neck: Normal range of motion.  Respiratory: Effort normal.  Musculoskeletal: Normal range of motion.  Neurological: He is alert and oriented to person, place, and time.  Psychiatric: He has a normal mood and affect. His speech is normal and behavior is normal. Judgment and thought content normal. Cognition and memory are normal.    Review of Systems  Psychiatric/Behavioral: Positive for substance abuse.  All other systems reviewed and are negative.   Blood  pressure 121/80, pulse 85, temperature 97.9 F (36.6 C), temperature source Oral, resp. rate 18, height 5\' 11"  (1.803 m), weight 74.8 kg (165 lb), SpO2 98 %.Body mass index is 23.01 kg/m.  General Appearance: Casual  Eye Contact:  Good  Speech:  Normal Rate  Volume:  Normal  Mood:  Euthymic  Affect:  Congruent  Thought Process:  Coherent and Descriptions of Associations: Intact  Orientation:  Full (Time, Place, and Person)  Thought Content:  WDL  Suicidal Thoughts:  No  Homicidal Thoughts:  No  Memory:  Immediate;   Good Recent;   Good Remote;   Good  Judgement:  Fair  Insight:  Fair  Psychomotor Activity:  Normal  Concentration:  Concentration: Good and Attention Span: Good  Recall:  Good  Fund of Knowledge:  Fair  Language:  Good  Akathisia:  No  Handed:  Right  AIMS (if indicated):     Assets:  Housing Leisure Time Physical Health Resilience Social Support  ADL's:  Intact  Cognition:  WNL  Sleep:      Mental Status Per Nursing Assessment::   On Admission:   alcohol and cocaine abuse with suicidal ideations  Demographic Factors:  Male and Caucasian  Loss Factors: NA  Historical Factors: NA  Risk Reduction Factors:   Sense of responsibility to family, Living with another person, especially a relative, Positive social support and Positive therapeutic relationship  Continued Clinical Symptoms:  None  Cognitive Features That Contribute To Risk:  None    Suicide Risk:  Minimal: No identifiable suicidal ideation.  Patients presenting with no risk factors but with morbid ruminations; may be classified as minimal risk based on the severity of the depressive symptoms    Plan  Of Care/Follow-up recommendations:  Activity:  as tolerated Diet:  heart healthy diet  LORD, JAMISON, NP 11/01/2016, 9:57 AM

## 2016-11-01 NOTE — [facility] Assessment (Signed)
[facility] Assessment Progress Note  Per Thedore MinsMojeed Akintayo, MD, this pt does not require psychiatric hospitalization at this time.  [facility] and for area substance abuse treatment providers.  This has been included in pt's discharge instructions.  Pt's nurse, Dawnaly, has been notified.  Curtis Canninghomas Daphyne Miguez, MA Triage Specialist [phone]
